# Patient Record
Sex: Male | Born: 1980 | Race: White | Hispanic: No | Marital: Married | Smoking: Current every day smoker
Health system: Southern US, Community
[De-identification: ages and names within clinical notes are randomized; demographics above are authoritative.]

## PROBLEM LIST (undated history)

## (undated) DIAGNOSIS — J45909 Unspecified asthma, uncomplicated: Secondary | ICD-10-CM

## (undated) DIAGNOSIS — F1010 Alcohol abuse, uncomplicated: Secondary | ICD-10-CM

## (undated) DIAGNOSIS — B86 Scabies: Secondary | ICD-10-CM

## (undated) DIAGNOSIS — K5900 Constipation, unspecified: Secondary | ICD-10-CM

## (undated) DIAGNOSIS — E119 Type 2 diabetes mellitus without complications: Secondary | ICD-10-CM

## (undated) DIAGNOSIS — E079 Disorder of thyroid, unspecified: Secondary | ICD-10-CM

## (undated) DIAGNOSIS — A749 Chlamydial infection, unspecified: Secondary | ICD-10-CM

## (undated) DIAGNOSIS — T884XXA Failed or difficult intubation, initial encounter: Secondary | ICD-10-CM

## (undated) DIAGNOSIS — IMO0002 Reserved for concepts with insufficient information to code with codable children: Secondary | ICD-10-CM

## (undated) DIAGNOSIS — N4889 Other specified disorders of penis: Principal | ICD-10-CM

## (undated) HISTORY — DX: Chlamydial infection, unspecified: A74.9

## (undated) HISTORY — DX: Constipation, unspecified: K59.00

## (undated) HISTORY — PX: CYST EXCISION: SHX5701

## (undated) HISTORY — DX: Disorder of thyroid, unspecified: E07.9

## (undated) HISTORY — DX: Unspecified asthma, uncomplicated: J45.909

## (undated) HISTORY — PX: ABCESS DRAINAGE: SHX399

## (undated) HISTORY — DX: Alcohol abuse, uncomplicated: F10.10

## (undated) HISTORY — DX: Reserved for concepts with insufficient information to code with codable children: IMO0002

## (undated) HISTORY — PX: RHYTIDECTOMY NECK / CHEEK / CHIN: SUR1286

## (undated) HISTORY — DX: Scabies: B86

## (undated) HISTORY — DX: Other specified disorders of penis: N48.89

---

## 2008-07-08 ENCOUNTER — Emergency Department: Payer: Self-pay | Admitting: Emergency Medicine

## 2010-08-02 ENCOUNTER — Emergency Department: Payer: Self-pay | Admitting: Unknown Physician Specialty

## 2010-12-17 ENCOUNTER — Emergency Department: Payer: Self-pay | Admitting: Emergency Medicine

## 2011-03-31 ENCOUNTER — Emergency Department: Payer: Self-pay | Admitting: Unknown Physician Specialty

## 2011-05-31 ENCOUNTER — Emergency Department: Payer: Self-pay | Admitting: Emergency Medicine

## 2011-10-06 ENCOUNTER — Emergency Department: Payer: Self-pay | Admitting: Emergency Medicine

## 2011-10-06 LAB — CBC WITH DIFFERENTIAL/PLATELET
Basophil #: 0.1 10*3/uL (ref 0.0–0.1)
Eosinophil #: 0.1 10*3/uL (ref 0.0–0.7)
Eosinophil %: 0.7 %
HGB: 15.3 g/dL (ref 13.0–18.0)
Lymphocyte #: 1.8 10*3/uL (ref 1.0–3.6)
Lymphocyte %: 17 %
MCH: 32 pg (ref 26.0–34.0)
MCHC: 34.5 g/dL (ref 32.0–36.0)
Monocyte %: 6.2 %
Neutrophil #: 8.2 10*3/uL — ABNORMAL HIGH (ref 1.4–6.5)
Neutrophil %: 75.6 %
Platelet: 181 10*3/uL (ref 150–440)
RBC: 4.76 10*6/uL (ref 4.40–5.90)
RDW: 13.2 % (ref 11.5–14.5)
WBC: 10.8 10*3/uL — ABNORMAL HIGH (ref 3.8–10.6)

## 2011-10-06 LAB — BASIC METABOLIC PANEL
BUN: 16 mg/dL (ref 7–18)
Calcium, Total: 8.7 mg/dL (ref 8.5–10.1)
Co2: 25 mmol/L (ref 21–32)
EGFR (African American): >60
EGFR (Non-African Amer.): >60
Glucose: 143 mg/dL — ABNORMAL HIGH (ref 65–99)
Osmolality: 283 (ref 275–301)

## 2012-01-23 ENCOUNTER — Emergency Department: Payer: Self-pay | Admitting: Emergency Medicine

## 2012-01-23 LAB — URINALYSIS, COMPLETE
Bilirubin,UR: NEGATIVE
Blood: NEGATIVE
Ketone: NEGATIVE
Protein: NEGATIVE
RBC,UR: NONE SEEN /HPF (ref 0–5)
Specific Gravity: 1.018 (ref 1.003–1.030)
WBC UR: <1 /HPF (ref 0–5)

## 2012-01-25 ENCOUNTER — Emergency Department: Payer: Self-pay | Admitting: Emergency Medicine

## 2012-01-25 LAB — URINALYSIS, COMPLETE
Blood: NEGATIVE
Glucose,UR: NEGATIVE mg/dL (ref 0–75)
Leukocyte Esterase: NEGATIVE
Nitrite: NEGATIVE
Protein: NEGATIVE
RBC,UR: 1 /HPF (ref 0–5)
WBC UR: 1 /HPF (ref 0–5)

## 2012-02-16 ENCOUNTER — Emergency Department: Payer: Self-pay | Admitting: Emergency Medicine

## 2012-04-28 ENCOUNTER — Emergency Department: Payer: Self-pay | Admitting: Emergency Medicine

## 2012-04-28 LAB — URINALYSIS, COMPLETE
Bacteria: NONE SEEN
Bilirubin,UR: NEGATIVE
Glucose,UR: NEGATIVE mg/dL (ref 0–75)
Ketone: NEGATIVE
Leukocyte Esterase: NEGATIVE
Nitrite: NEGATIVE
Protein: NEGATIVE
Specific Gravity: 1.028 (ref 1.003–1.030)

## 2012-07-25 ENCOUNTER — Emergency Department: Payer: Self-pay | Admitting: Emergency Medicine

## 2012-07-25 LAB — URINALYSIS, COMPLETE
Bacteria: NONE SEEN
Bilirubin,UR: NEGATIVE
Blood: NEGATIVE
Glucose,UR: 50 mg/dL (ref 0–75)
Ketone: NEGATIVE
Leukocyte Esterase: NEGATIVE
Ph: 6 (ref 4.5–8.0)
Protein: NEGATIVE
RBC,UR: <1 /HPF (ref 0–5)
WBC UR: 1 /HPF (ref 0–5)

## 2012-07-27 ENCOUNTER — Emergency Department: Payer: Self-pay | Admitting: Emergency Medicine

## 2012-09-01 ENCOUNTER — Emergency Department: Payer: Self-pay | Admitting: Unknown Physician Specialty

## 2013-03-22 ENCOUNTER — Emergency Department: Payer: Self-pay | Admitting: Emergency Medicine

## 2013-05-03 ENCOUNTER — Emergency Department: Payer: Self-pay | Admitting: Emergency Medicine

## 2013-05-03 LAB — URINALYSIS, COMPLETE
Bacteria: NONE SEEN
Blood: NEGATIVE
Ketone: NEGATIVE
Nitrite: NEGATIVE
Ph: 5 (ref 4.5–8.0)
Protein: NEGATIVE
Specific Gravity: 1.024 (ref 1.003–1.030)

## 2013-05-09 ENCOUNTER — Emergency Department: Payer: Self-pay | Admitting: Emergency Medicine

## 2013-05-09 LAB — COMPREHENSIVE METABOLIC PANEL
Albumin: 3.5 g/dL (ref 3.4–5.0)
Alkaline Phosphatase: 61 U/L
Anion Gap: 5 — ABNORMAL LOW (ref 7–16)
BUN: 11 mg/dL (ref 7–18)
Calcium, Total: 8.9 mg/dL (ref 8.5–10.1)
Chloride: 107 mmol/L (ref 98–107)
Creatinine: 0.9 mg/dL (ref 0.60–1.30)
EGFR (African American): >60
EGFR (Non-African Amer.): >60
Osmolality: 275 (ref 275–301)
Potassium: 3.6 mmol/L (ref 3.5–5.1)
SGPT (ALT): 34 U/L (ref 12–78)
Sodium: 138 mmol/L (ref 136–145)
Total Protein: 7.4 g/dL (ref 6.4–8.2)

## 2013-05-09 LAB — CBC
HCT: 44 % (ref 40.0–52.0)
MCV: 91 fL (ref 80–100)
Platelet: 175 10*3/uL (ref 150–440)
RBC: 4.84 10*6/uL (ref 4.40–5.90)
RDW: 13.4 % (ref 11.5–14.5)

## 2013-05-09 LAB — URINALYSIS, COMPLETE
Bacteria: NONE SEEN
Blood: NEGATIVE
Glucose,UR: NEGATIVE mg/dL (ref 0–75)
Ketone: NEGATIVE
Nitrite: NEGATIVE
Protein: NEGATIVE
RBC,UR: 1 /HPF (ref 0–5)
Specific Gravity: 1.017 (ref 1.003–1.030)
Squamous Epithelial: NONE SEEN
WBC UR: <1 /HPF (ref 0–5)

## 2013-05-16 ENCOUNTER — Emergency Department: Payer: Self-pay | Admitting: Emergency Medicine

## 2013-05-16 LAB — COMPREHENSIVE METABOLIC PANEL
Albumin: 3.8 g/dL (ref 3.4–5.0)
Alkaline Phosphatase: 64 U/L
Anion Gap: 4 — ABNORMAL LOW (ref 7–16)
BUN: 14 mg/dL (ref 7–18)
Bilirubin,Total: 0.3 mg/dL (ref 0.2–1.0)
Calcium, Total: 9 mg/dL (ref 8.5–10.1)
Chloride: 106 mmol/L (ref 98–107)
Co2: 28 mmol/L (ref 21–32)
Creatinine: 0.91 mg/dL (ref 0.60–1.30)
EGFR (African American): >60
EGFR (Non-African Amer.): >60
Glucose: 90 mg/dL (ref 65–99)
Osmolality: 276 (ref 275–301)
Potassium: 4 mmol/L (ref 3.5–5.1)
SGOT(AST): 27 U/L (ref 15–37)
SGPT (ALT): 33 U/L (ref 12–78)
Sodium: 138 mmol/L (ref 136–145)
Total Protein: 7.8 g/dL (ref 6.4–8.2)

## 2013-05-16 LAB — CBC WITH DIFFERENTIAL/PLATELET
Basophil #: 0.1 10*3/uL (ref 0.0–0.1)
Eosinophil #: 0.1 10*3/uL (ref 0.0–0.7)
Eosinophil %: 1.1 %
Lymphocyte #: 2.2 10*3/uL (ref 1.0–3.6)
Lymphocyte %: 18.7 %
MCH: 30.6 pg (ref 26.0–34.0)
MCV: 92 fL (ref 80–100)
Neutrophil #: 8.4 10*3/uL — ABNORMAL HIGH (ref 1.4–6.5)
Platelet: 179 10*3/uL (ref 150–440)
RDW: 13.7 % (ref 11.5–14.5)
WBC: 11.7 10*3/uL — ABNORMAL HIGH (ref 3.8–10.6)

## 2013-05-16 LAB — MONONUCLEOSIS SCREEN: Mono Test: NEGATIVE

## 2013-05-16 LAB — RAPID HIV-1/2 QL/CONFIRM: HIV-1/2,Rapid Ql: NEGATIVE

## 2013-05-31 ENCOUNTER — Emergency Department: Payer: Self-pay | Admitting: Emergency Medicine

## 2013-05-31 LAB — RAPID INFLUENZA A&B ANTIGENS (ARMC ONLY)

## 2013-06-07 ENCOUNTER — Emergency Department: Payer: Self-pay | Admitting: Emergency Medicine

## 2013-07-31 ENCOUNTER — Emergency Department: Payer: Self-pay | Admitting: Emergency Medicine

## 2013-07-31 LAB — URINALYSIS, COMPLETE
BILIRUBIN, UR: NEGATIVE
Bacteria: NONE SEEN
Blood: NEGATIVE
GLUCOSE, UR: NEGATIVE mg/dL (ref 0–75)
Ketone: NEGATIVE
NITRITE: NEGATIVE
PH: 6 (ref 4.5–8.0)
PROTEIN: NEGATIVE
RBC,UR: 4 /HPF (ref 0–5)
SPECIFIC GRAVITY: 1.027 (ref 1.003–1.030)
Squamous Epithelial: NONE SEEN

## 2013-08-02 ENCOUNTER — Emergency Department: Payer: Self-pay | Admitting: Emergency Medicine

## 2013-08-02 LAB — URINALYSIS, COMPLETE
Bacteria: NONE SEEN
Bilirubin,UR: NEGATIVE
Blood: NEGATIVE
Glucose,UR: NEGATIVE mg/dL (ref 0–75)
Ketone: NEGATIVE
Leukocyte Esterase: NEGATIVE
Nitrite: NEGATIVE
Ph: 5 (ref 4.5–8.0)
Protein: NEGATIVE
RBC,UR: 3 /HPF (ref 0–5)
Specific Gravity: 1.026 (ref 1.003–1.030)
Squamous Epithelial: NONE SEEN
WBC UR: 2 /HPF (ref 0–5)

## 2013-08-06 LAB — URINE CULTURE

## 2013-10-11 ENCOUNTER — Emergency Department: Payer: Self-pay | Admitting: Emergency Medicine

## 2013-10-11 LAB — URINALYSIS, COMPLETE
BACTERIA: NONE SEEN
BLOOD: NEGATIVE
Bilirubin,UR: NEGATIVE
Glucose,UR: NEGATIVE mg/dL (ref 0–75)
KETONE: NEGATIVE
Leukocyte Esterase: NEGATIVE
Nitrite: NEGATIVE
Ph: 5 (ref 4.5–8.0)
Protein: NEGATIVE
RBC,UR: NONE SEEN /HPF (ref 0–5)
Specific Gravity: 1.027 (ref 1.003–1.030)
WBC UR: NONE SEEN /HPF (ref 0–5)

## 2013-10-11 LAB — CBC
HCT: 45.9 % (ref 40.0–52.0)
HGB: 15.6 g/dL (ref 13.0–18.0)
MCH: 31.8 pg (ref 26.0–34.0)
MCHC: 34 g/dL (ref 32.0–36.0)
MCV: 93 fL (ref 80–100)
Platelet: 183 10*3/uL (ref 150–440)
RBC: 4.91 10*6/uL (ref 4.40–5.90)
RDW: 13.3 % (ref 11.5–14.5)
WBC: 11 10*3/uL — AB (ref 3.8–10.6)

## 2013-10-11 LAB — COMPREHENSIVE METABOLIC PANEL
ALBUMIN: 3.9 g/dL (ref 3.4–5.0)
ALT: 41 U/L (ref 12–78)
ANION GAP: 3 — AB (ref 7–16)
Alkaline Phosphatase: 57 U/L
BILIRUBIN TOTAL: 0.3 mg/dL (ref 0.2–1.0)
BUN: 14 mg/dL (ref 7–18)
CALCIUM: 8.8 mg/dL (ref 8.5–10.1)
CHLORIDE: 106 mmol/L (ref 98–107)
CO2: 29 mmol/L (ref 21–32)
Creatinine: 1.06 mg/dL (ref 0.60–1.30)
Glucose: 85 mg/dL (ref 65–99)
OSMOLALITY: 275 (ref 275–301)
Potassium: 4 mmol/L (ref 3.5–5.1)
SGOT(AST): 31 U/L (ref 15–37)
SODIUM: 138 mmol/L (ref 136–145)
Total Protein: 7.9 g/dL (ref 6.4–8.2)

## 2013-10-23 ENCOUNTER — Emergency Department: Payer: Self-pay

## 2013-11-05 ENCOUNTER — Emergency Department: Payer: Self-pay | Admitting: Emergency Medicine

## 2013-12-06 ENCOUNTER — Emergency Department: Payer: Self-pay | Admitting: Emergency Medicine

## 2013-12-06 LAB — URINALYSIS, COMPLETE
BLOOD: NEGATIVE
Bilirubin,UR: NEGATIVE
KETONE: NEGATIVE
LEUKOCYTE ESTERASE: NEGATIVE
NITRITE: NEGATIVE
PH: 6 (ref 4.5–8.0)
Protein: NEGATIVE
RBC,UR: <1 /HPF (ref 0–5)
SPECIFIC GRAVITY: 1.027 (ref 1.003–1.030)
Squamous Epithelial: <1
WBC UR: 1 /HPF (ref 0–5)

## 2013-12-06 LAB — GC/CHLAMYDIA PROBE AMP

## 2014-04-02 ENCOUNTER — Emergency Department: Payer: Self-pay | Admitting: Emergency Medicine

## 2014-04-13 ENCOUNTER — Emergency Department: Payer: Self-pay | Admitting: Internal Medicine

## 2014-04-13 LAB — URINALYSIS, COMPLETE
BILIRUBIN, UR: NEGATIVE
Bacteria: NONE SEEN
Blood: NEGATIVE
Glucose,UR: NEGATIVE mg/dL (ref 0–75)
KETONE: NEGATIVE
Leukocyte Esterase: NEGATIVE
NITRITE: NEGATIVE
PH: 5 (ref 4.5–8.0)
Protein: NEGATIVE
RBC,UR: <1 /HPF (ref 0–5)
SQUAMOUS EPITHELIAL: NONE SEEN
Specific Gravity: 1.019 (ref 1.003–1.030)
WBC UR: 1 /HPF (ref 0–5)

## 2014-04-16 LAB — BETA STREP CULTURE(ARMC)

## 2014-06-26 ENCOUNTER — Emergency Department: Payer: Self-pay | Admitting: Emergency Medicine

## 2014-06-26 LAB — URINALYSIS, COMPLETE
BACTERIA: NONE SEEN
Bilirubin,UR: NEGATIVE
Blood: NEGATIVE
GLUCOSE, UR: NEGATIVE mg/dL (ref 0–75)
Ketone: NEGATIVE
LEUKOCYTE ESTERASE: NEGATIVE
Nitrite: NEGATIVE
Ph: 6 (ref 4.5–8.0)
Protein: NEGATIVE
RBC,UR: 1 /HPF (ref 0–5)
SPECIFIC GRAVITY: 1.027 (ref 1.003–1.030)
WBC UR: NONE SEEN /HPF (ref 0–5)

## 2014-06-26 LAB — GC/CHLAMYDIA PROBE AMP

## 2014-06-29 ENCOUNTER — Emergency Department: Payer: Self-pay | Admitting: Physician Assistant

## 2014-07-05 ENCOUNTER — Emergency Department: Payer: Self-pay | Admitting: Emergency Medicine

## 2014-07-05 LAB — COMPREHENSIVE METABOLIC PANEL
ALK PHOS: 69 U/L (ref 46–116)
ALT: 36 U/L (ref 14–63)
AST: 30 U/L (ref 15–37)
Albumin: 4 g/dL (ref 3.4–5.0)
Anion Gap: 8 (ref 7–16)
BILIRUBIN TOTAL: 0.4 mg/dL (ref 0.2–1.0)
BUN: 12 mg/dL (ref 7–18)
CALCIUM: 8.8 mg/dL (ref 8.5–10.1)
Chloride: 107 mmol/L (ref 98–107)
Co2: 24 mmol/L (ref 21–32)
Creatinine: 0.94 mg/dL (ref 0.60–1.30)
EGFR (African American): >60
EGFR (Non-African Amer.): >60
GLUCOSE: 86 mg/dL (ref 65–99)
Osmolality: 277 (ref 275–301)
Potassium: 3.8 mmol/L (ref 3.5–5.1)
Sodium: 139 mmol/L (ref 136–145)
Total Protein: 8 g/dL (ref 6.4–8.2)

## 2014-07-05 LAB — URINALYSIS, COMPLETE
BILIRUBIN, UR: NEGATIVE
BLOOD: NEGATIVE
Bacteria: NONE SEEN
Glucose,UR: NEGATIVE mg/dL (ref 0–75)
Leukocyte Esterase: NEGATIVE
Nitrite: NEGATIVE
PH: 5 (ref 4.5–8.0)
Protein: NEGATIVE
RBC, UR: NONE SEEN /HPF (ref 0–5)
SQUAMOUS EPITHELIAL: NONE SEEN
Specific Gravity: 1.026 (ref 1.003–1.030)
WBC UR: NONE SEEN /HPF (ref 0–5)

## 2014-07-05 LAB — CBC WITH DIFFERENTIAL/PLATELET
BASOS ABS: 0.1 10*3/uL (ref 0.0–0.1)
Basophil %: 0.8 %
Eosinophil #: 0.1 10*3/uL (ref 0.0–0.7)
Eosinophil %: 0.6 %
HCT: 47.4 % (ref 40.0–52.0)
HGB: 15.8 g/dL (ref 13.0–18.0)
Lymphocyte #: 1.7 10*3/uL (ref 1.0–3.6)
Lymphocyte %: 15.4 %
MCH: 31.2 pg (ref 26.0–34.0)
MCHC: 33.3 g/dL (ref 32.0–36.0)
MCV: 94 fL (ref 80–100)
MONO ABS: 0.7 x10 3/mm (ref 0.2–1.0)
MONOS PCT: 6 %
Neutrophil #: 8.3 10*3/uL — ABNORMAL HIGH (ref 1.4–6.5)
Neutrophil %: 77.2 %
PLATELETS: 185 10*3/uL (ref 150–440)
RBC: 5.07 10*6/uL (ref 4.40–5.90)
RDW: 13.2 % (ref 11.5–14.5)
WBC: 10.8 10*3/uL — AB (ref 3.8–10.6)

## 2014-07-05 LAB — LIPASE, BLOOD: Lipase: 160 U/L (ref 73–393)

## 2014-09-26 ENCOUNTER — Emergency Department: Admit: 2014-09-26 | Disposition: A | Discharge status: Home / Self Care | Payer: Self-pay | Admitting: Student

## 2014-09-28 ENCOUNTER — Emergency Department
Admission: EM | Admit: 2014-09-28 | Discharge: 2014-09-28 | Disposition: A | Discharge status: Home / Self Care | Payer: Self-pay | Attending: Emergency Medicine | Admitting: Emergency Medicine

## 2014-09-28 ENCOUNTER — Encounter: Payer: Self-pay | Admitting: Emergency Medicine

## 2014-09-28 DIAGNOSIS — Z5189 Encounter for other specified aftercare: Secondary | ICD-10-CM

## 2014-09-28 DIAGNOSIS — Z720 Tobacco use: Secondary | ICD-10-CM | POA: Insufficient documentation

## 2014-09-28 DIAGNOSIS — J069 Acute upper respiratory infection, unspecified: Secondary | ICD-10-CM | POA: Insufficient documentation

## 2014-09-28 DIAGNOSIS — Z4801 Encounter for change or removal of surgical wound dressing: Secondary | ICD-10-CM | POA: Insufficient documentation

## 2014-09-28 MED ORDER — GUAIFENESIN 100 MG/5ML PO SOLN
5.0000 mL | ORAL | Status: DC | PRN
Start: 2014-09-28 — End: 2014-12-18

## 2014-09-28 NOTE — ED Notes (Signed)
Patient to ED with c/o wound check. Patient had wound drained and packed on Friday. Patient denies increase drainage, pain, or discomfort with packing. Patient also complaining of head congestion. Patient alert and oriented. Patient in NAD.

## 2014-09-28 NOTE — ED Provider Notes (Signed)
[facility] Emergency Department Provider Note    ____________________________________________  Time seen: 07 50  I have reviewed the triage vital signs and the nursing notes.   HISTORY  Chief Complaint Wound Check       HPI Ronald Montgomery is a 34 y.o. male Mr. Fettig returns today for wound check after an I&D on the perineum on Friday he reports that his pain is about a 4 on a 10 he has had some purulent drainage overall feeling better. Mr. Leh also complains today of congestion and cough which started 2 days ago.    History reviewed. No pertinent past medical history.  There are no active problems to display for this patient.   Past Surgical History  Procedure Laterality Date  . Neck surgery      No current outpatient prescriptions on file.  Allergies Review of patient's allergies indicates no known allergies.  History reviewed. No pertinent family history.  Social History History  Substance Use Topics  . Smoking status: Smoker, Current Status Unknown  . Smokeless tobacco: Not on file  . Alcohol Use: No    Review of Systems  Constitutional: Negative for fever. Eyes: Negative for visual changes. ENT:  sore throat. Cardiovascular: Negative for chest pain. Respiratory: Negative for shortness of breath. Gastrointestinal: Negative for abdominal pain, vomiting and diarrhea. Musculoskeletal: Negative for back pain. Skin: Negative for rash. Neurological: Negative for headaches, focal weakness or numbness.   10-point ROS otherwise negative.  ____________________________________________   PHYSICAL EXAM:  VITAL SIGNS: ED Triage Vitals  Enc Vitals Group     BP 09/28/14 0737 135/78 mmHg     Pulse Rate 09/28/14 0737 95     Resp 09/28/14 0737 18     Temp 09/28/14 0737 99.1 F (37.3 C)     Temp Source 09/28/14 0737 Oral     SpO2 09/28/14 0737 98 %     Weight 09/28/14 0737 240 lb (108.863 kg)     Height 09/28/14 0737   (1.676 m)     Head Cir --      Peak Flow --      Pain Score 09/28/14 0739 7     Pain Loc --      Pain Edu? --      Excl. in GC? --      Constitutional: Alert and oriented. Well appearing and in no distress. Eyes: Conjunctivae are normal. PERRL. Normal extraocular movements. ENT   Head: Normocephalic and atraumatic.   Nose: No congestion/rhinnorhea.   Mouth/Throat: Mucous membranes are moist.   Neck: No stridor. Hematological/Lymphatic/Immunilogical: No cervical lymphadenopathy. Cardiovascular: Normal rate, regular rhythm. Normal and symmetric distal pulses are present in all extremities. Respiratory: Normal respiratory effort without tachypnea nor retractions. Breath sounds are clear and equal bilaterally. No wheezes/rales/rhonchi. Gastrointestinal: Soft and nontender. No distention. No abdominal bruits. There is no CVA tenderness. Musculoskeletal: Nontender with normal range of motion in all extremities. No joint effusions.   Right lower leg:  No tenderness or edema.   Left lower leg:  No tenderness or edema. Neurologic:  Normal speech and language. No gross focal neurologic deficits are appreciated. Speech is normal. No gait instability. Skin:  Skin is warm, dry and intact. No rash noted. Site of I&D appears to be healing well with no fluctuance. Packing removed. Psychiatric: Mood and affect are normal. Speech and behavior are normal. Patient exhibits appropriate insight and judgment.  ____________________________________________   EKG    ____________________________________________    RADIOLOGY  ____________________________________________   PROCEDURES  Procedure(s) performed: Iodiform gauze removed from perineal area  Critical Care performed: No  ____________________________________________   INITIAL IMPRESSION / ASSESSMENT AND PLAN / ED COURSE  Pertinent labs & imaging results that were available during my care of the patient were  reviewed by me and considered in my medical decision making (see chart for details).  Wound check;  upper respiratory infection  ____________________________________________   FINAL CLINICAL IMPRESSION(S) / ED DIAGNOSES  Final diagnoses:  None    Chinita PesterCari B Lillis Nuttle, FNP 09/28/14 [phone]

## 2014-09-28 NOTE — ED Notes (Signed)
Packing removed from wound by Lorra Halsaribeth Triplett, PA. Patient tolerated packing removal.

## 2014-11-08 ENCOUNTER — Emergency Department
Admission: EM | Admit: 2014-11-08 | Discharge: 2014-11-08 | Disposition: A | Discharge status: Home / Self Care | Payer: Self-pay | Attending: Emergency Medicine | Admitting: Emergency Medicine

## 2014-11-08 ENCOUNTER — Encounter: Payer: Self-pay | Admitting: Emergency Medicine

## 2014-11-08 DIAGNOSIS — N4889 Other specified disorders of penis: Secondary | ICD-10-CM | POA: Insufficient documentation

## 2014-11-08 DIAGNOSIS — Z720 Tobacco use: Secondary | ICD-10-CM | POA: Insufficient documentation

## 2014-11-08 DIAGNOSIS — R300 Dysuria: Secondary | ICD-10-CM | POA: Insufficient documentation

## 2014-11-08 LAB — URINALYSIS COMPLETE WITH MICROSCOPIC (ARMC ONLY)
Bacteria, UA: NONE SEEN
Bilirubin Urine: NEGATIVE
GLUCOSE, UA: NEGATIVE mg/dL
Hgb urine dipstick: NEGATIVE
Ketones, ur: NEGATIVE mg/dL
Leukocytes, UA: NEGATIVE
NITRITE: NEGATIVE
PH: 6 (ref 5.0–8.0)
Protein, ur: NEGATIVE mg/dL
Specific Gravity, Urine: 1.014 (ref 1.005–1.030)

## 2014-11-08 MED ORDER — KETOROLAC TROMETHAMINE 10 MG PO TABS
10.0000 mg | ORAL_TABLET | Freq: Once | ORAL | Status: AC
  Administered 2014-11-08: 10 mg via ORAL

## 2014-11-08 MED ORDER — PHENAZOPYRIDINE HCL 200 MG PO TABS: 200.0000 mg | ORAL_TABLET | Freq: Three times a day (TID) | ORAL | Status: DC | PRN

## 2014-11-08 MED ORDER — NAPROXEN 500 MG PO TABS: 500.0000 mg | ORAL_TABLET | Freq: Two times a day (BID) | ORAL | Status: DC

## 2014-11-08 MED ORDER — PHENAZOPYRIDINE HCL 200 MG PO TABS
ORAL_TABLET | ORAL | Status: AC
  Administered 2014-11-08: 200 mg via ORAL
  Filled 2014-11-08: qty 1

## 2014-11-08 MED ORDER — KETOROLAC TROMETHAMINE 10 MG PO TABS
ORAL_TABLET | ORAL | Status: AC
  Administered 2014-11-08: 10 mg via ORAL
  Filled 2014-11-08: qty 1

## 2014-11-08 MED ORDER — PHENAZOPYRIDINE HCL 200 MG PO TABS
200.0000 mg | ORAL_TABLET | Freq: Once | ORAL | Status: AC
  Administered 2014-11-08: 200 mg via ORAL

## 2014-11-08 NOTE — ED Provider Notes (Signed)
CSN: [id number]     Arrival date & time 11/08/14  1244 History   First MD Initiated Contact with Patient 11/08/14 1301     Chief Complaint  Patient presents with  . Penis Pain    pain with ejaculation last night     (Consider location/radiation/quality/duration/timing/severity/associated sxs/prior Treatment) HPI  Patient presents today with penile pain dysuria for 1 day take as a new sexual partner has not noticed any discharge he has had some pain which she says is near his scrotum that he is currently not having and noted a pain with ejaculation X1 otherwise denies symptoms. Dates that he has had multiple issues due to the fact that he is uncircumcised seen urologist in the past they he had chlamydia once in the past he does not have symptoms that are similar to that and is here for further evaluation and treatment   History reviewed. No pertinent past medical history. Past Surgical History  Procedure Laterality Date  . Neck surgery     No family history on file. History  Substance Use Topics  . Smoking status: Smoker, Current Status Unknown -- 0.50 packs/day    Types: Cigarettes  . Smokeless tobacco: Not on file  . Alcohol Use: No    Review of Systems  Constitutional: Negative.   HENT: Negative.   Eyes: Negative.   Respiratory: Negative.   Cardiovascular: Negative.   Gastrointestinal: Negative.   Musculoskeletal: Negative.   Skin: Negative.   Neurological: Negative.   All other systems reviewed and are negative.      Allergies  Review of patient's allergies indicates no known allergies.  Home Medications   Prior to Admission medications   Medication Sig Start Date End Date Taking? Authorizing Provider  guaiFENesin (ROBITUSSIN) 100 MG/5ML SOLN Take 5 mLs (100 mg total) by mouth every 4 (four) hours as needed for cough or to loosen phlegm. 09/28/14   Chinita Pester, FNP  naproxen (NAPROSYN) 500 MG tablet Take 1 tablet (500 mg total) by mouth 2 (two) times daily  with a meal. 11/08/14 11/08/15  Heike Pounds Kristine Garbe Raeshawn Tafolla, PA-C  phenazopyridine (PYRIDIUM) 200 MG tablet Take 1 tablet (200 mg total) by mouth 3 (three) times daily as needed for pain. 11/08/14 11/08/15  Deshayla Empson William C Augustino Savastano, PA-C   BP 127/86 mmHg  Pulse 113  Temp(Src) 98.5 F (36.9 C) (Oral)  Resp 20  Ht  (1.676 m)  Wt 250 lb (113.399 kg)  BMI 40.37 kg/m2  SpO2 98% Physical Exam BP 127/86 mmHg  Pulse 113  Temp(Src) 98.5 F (36.9 C) (Oral)  Resp 20  Ht  (1.676 m)  Wt 250 lb (113.399 kg)  BMI 40.37 kg/m2  SpO2 98%  General Appearance:    Alert, cooperative, no distress, appears stated age  Head:    Normocephalic, without obvious abnormality, atraumatic   Eyes:    PERRL, conjunctiva/corneas clear, EOM's intact, fundi    benign, both eyes       Ears:    Normal TM's and external ear canals, both ears  Nose:   Nares normal, septum midline, mucosa normal, no drainage   or sinus tenderness  Throat:   Lips, mucosa, and tongue normal; teeth and gums normal  Neck:   Supple, symmetrical, trachea midline, no adenopathy;       thyroid:  No enlargement/tenderness/nodules; no carotid   bruit or JVD  Back:     Symmetric, no curvature, ROM normal, no CVA tenderness  Lungs:  Clear to auscultation bilaterally, respirations unlabored  Chest wall:    No tenderness or deformity  Heart:    Regular rate and rhythm, S1 and S2 normal, no murmur, rub   or gallop  Abdomen:     Soft, non-tender, bowel sounds active all four quadrants,    no masses, no organomegaly  Genitalia:    Normal male without lesion, discharge or tenderness   Patient uncircumcised is a small fissure at the base of the head of his penis where his foreskins attached   Extremities:   Extremities normal, atraumatic, no cyanosis or edema  Pulses:   2+ and symmetric all extremities  Skin:   Skin color, texture, turgor normal, no rashes or lesions     Neurologic:   CNII-XII intact. Normal strength, sensation and reflexes       throughout   ED Course  Procedures (including critical care time) Labs Review Labs Reviewed  URINALYSIS COMPLETEWITH MICROSCOPIC ([facility] ONLY) - Abnormal; Notable for the following:    Color, Urine YELLOW (*)    APPearance CLEAR (*)    Squamous Epithelial / LPF 0-5 (*)    All other components within normal limits    Imaging Review No results found.   EKG Interpretation None      MDM   decision making on this patient otherwise normal exam normal urinalysis being that he said problems for many years with this in the past recommending that he follow up with urology discussed getting a circumcisio was given prescriptions for Pyridium and Motrin follow-up urology return here for any acute concerns or worsening symptoms Final diagnoses:  Dysuria  Foreskin fissure        Lashena Signer Rosalyn Gess, PA-C 11/08/14 1607  Governor Rooks, MD 11/09/14 1528

## 2014-11-08 NOTE — ED Notes (Signed)
No discharge

## 2014-11-08 NOTE — Discharge Instructions (Signed)

## 2014-11-24 ENCOUNTER — Encounter: Payer: Self-pay | Admitting: General Practice

## 2014-11-24 ENCOUNTER — Emergency Department: Payer: Self-pay

## 2014-11-24 ENCOUNTER — Emergency Department
Admission: EM | Admit: 2014-11-24 | Discharge: 2014-11-24 | Disposition: A | Discharge status: Home / Self Care | Payer: Self-pay | Attending: Emergency Medicine | Admitting: Emergency Medicine

## 2014-11-24 DIAGNOSIS — Z720 Tobacco use: Secondary | ICD-10-CM | POA: Insufficient documentation

## 2014-11-24 DIAGNOSIS — L309 Dermatitis, unspecified: Secondary | ICD-10-CM | POA: Insufficient documentation

## 2014-11-24 DIAGNOSIS — R1084 Generalized abdominal pain: Secondary | ICD-10-CM | POA: Insufficient documentation

## 2014-11-24 DIAGNOSIS — R21 Rash and other nonspecific skin eruption: Secondary | ICD-10-CM

## 2014-11-24 DIAGNOSIS — R109 Unspecified abdominal pain: Secondary | ICD-10-CM

## 2014-11-24 LAB — COMPREHENSIVE METABOLIC PANEL
ALBUMIN: 3.8 g/dL (ref 3.5–5.0)
ALT: 26 U/L (ref 17–63)
AST: 24 U/L (ref 15–41)
Alkaline Phosphatase: 43 U/L (ref 38–126)
Anion gap: 7 (ref 5–15)
BILIRUBIN TOTAL: 0.3 mg/dL (ref 0.3–1.2)
BUN: 11 mg/dL (ref 6–20)
CALCIUM: 8.7 mg/dL — AB (ref 8.9–10.3)
CO2: 28 mmol/L (ref 22–32)
Chloride: 104 mmol/L (ref 101–111)
Creatinine, Ser: 1.1 mg/dL (ref 0.61–1.24)
GFR calc Af Amer: >60 mL/min (ref >60)
GFR calc non Af Amer: >60 mL/min (ref >60)
Glucose, Bld: 118 mg/dL — ABNORMAL HIGH (ref 65–99)
Potassium: 4 mmol/L (ref 3.5–5.1)
Sodium: 139 mmol/L (ref 135–145)
TOTAL PROTEIN: 6.7 g/dL (ref 6.5–8.1)

## 2014-11-24 LAB — CBC WITH DIFFERENTIAL/PLATELET
Basophils Absolute: 0.1 10*3/uL (ref 0–0.1)
Basophils Relative: 1 %
EOS ABS: 0.1 10*3/uL (ref 0–0.7)
EOS PCT: 1 %
HEMATOCRIT: 45.3 % (ref 40.0–52.0)
HEMOGLOBIN: 15 g/dL (ref 13.0–18.0)
Lymphocytes Relative: 17 %
Lymphs Abs: 1.9 10*3/uL (ref 1.0–3.6)
MCH: 30.6 pg (ref 26.0–34.0)
MCHC: 33.2 g/dL (ref 32.0–36.0)
MCV: 92.3 fL (ref 80.0–100.0)
MONO ABS: 0.6 10*3/uL (ref 0.2–1.0)
Monocytes Relative: 5 %
Neutro Abs: 8.2 10*3/uL — ABNORMAL HIGH (ref 1.4–6.5)
Neutrophils Relative %: 76 %
Platelets: 203 10*3/uL (ref 150–440)
RBC: 4.91 MIL/uL (ref 4.40–5.90)
RDW: 13.9 % (ref 11.5–14.5)
WBC: 10.9 10*3/uL — AB (ref 3.8–10.6)

## 2014-11-24 LAB — URINALYSIS COMPLETE WITH MICROSCOPIC (ARMC ONLY)
Bacteria, UA: NONE SEEN
Bilirubin Urine: NEGATIVE
Glucose, UA: NEGATIVE mg/dL
Hgb urine dipstick: NEGATIVE
KETONES UR: NEGATIVE mg/dL
Leukocytes, UA: NEGATIVE
Nitrite: NEGATIVE
PH: 5 (ref 5.0–8.0)
PROTEIN: NEGATIVE mg/dL
SPECIFIC GRAVITY, URINE: 1.024 (ref 1.005–1.030)

## 2014-11-24 MED ORDER — TRIAMCINOLONE ACETONIDE 0.5 % EX OINT: 1.0000 "application " | TOPICAL_OINTMENT | Freq: Two times a day (BID) | CUTANEOUS | Status: DC

## 2014-11-24 MED ORDER — RANITIDINE HCL 150 MG PO TABS: 150.0000 mg | ORAL_TABLET | Freq: Every day | ORAL | Status: DC

## 2014-11-24 NOTE — ED Notes (Signed)
Pt. Arrived to ed from home with reports of experiencing a testicle pain and rash to inner thighs that started last week. Pt reports on Friday he started gradually experiencing abdominal pain. Pt alert and oriented. Reports one episode of vomiting this AM.

## 2014-11-24 NOTE — Discharge Instructions (Signed)
Abdominal Pain °Many things can cause abdominal pain. Usually, abdominal pain is not caused by a disease and will improve without treatment. It can often be observed and treated at home. Your health care provider will do a physical exam and possibly order blood tests and X-rays to help determine the seriousness of your pain. However, in many cases, more time must pass before a clear cause of the pain can be found. Before that point, your health care provider may not know if you need more testing or further treatment. °HOME CARE INSTRUCTIONS  °Monitor your abdominal pain for any changes. The following actions may help to alleviate any discomfort you are experiencing: °· Only take over-the-counter or prescription medicines as directed by your health care provider. °· Do not take laxatives unless directed to do so by your health care provider. °· Try a clear liquid diet (broth, tea, or water) as directed by your health care provider. Slowly move to a bland diet as tolerated. °SEEK MEDICAL CARE IF: °· You have unexplained abdominal pain. °· You have abdominal pain associated with nausea or diarrhea. °· You have pain when you urinate or have a bowel movement. °· You experience abdominal pain that wakes you in the night. °· You have abdominal pain that is worsened or improved by eating food. °· You have abdominal pain that is worsened with eating fatty foods. °· You have a fever. °SEEK IMMEDIATE MEDICAL CARE IF:  °· Your pain does not go away within 2 hours. °· You keep throwing up (vomiting). °· Your pain is felt only in portions of the abdomen, such as the right side or the left lower portion of the abdomen. °· You pass bloody or black tarry stools. °MAKE SURE YOU: °· Understand these instructions.   °· Will watch your condition.   °· Will get help right away if you are not doing well or get worse.   °Document Released: 02/23/2005 Document Revised: 05/21/2013 Document Reviewed: 01/23/2013 °[employer]® Patient Information  ©2015 [employer]. This information is not intended to replace advice given to you by your health care provider. Make sure you discuss any questions you have with your health care provider. ° °Rash °A rash is a change in the color or texture of your skin. There are many different types of rashes. You may have other problems that accompany your rash. °CAUSES  °· Infections. °· Allergic reactions. This can include allergies to pets or foods. °· Certain medicines. °· Exposure to certain chemicals, soaps, or cosmetics. °· Heat. °· Exposure to poisonous plants. °· Tumors, both cancerous and noncancerous. °SYMPTOMS  °· Redness. °· Scaly skin. °· Itchy skin. °· Dry or cracked skin. °· Bumps. °· Blisters. °· Pain. °DIAGNOSIS  °Your caregiver may do a physical exam to determine what type of rash you have. A skin sample (biopsy) may be taken and examined under a microscope. °TREATMENT  °Treatment depends on the type of rash you have. Your caregiver may prescribe certain medicines. For serious conditions, you may need to see a skin doctor (dermatologist). °HOME CARE INSTRUCTIONS  °· Avoid the substance that caused your rash. °· Do not scratch your rash. This can cause infection. °· You may take cool baths to help stop itching. °· Only take over-the-counter or prescription medicines as directed by your caregiver. °· Keep all follow-up appointments as directed by your caregiver. °SEEK IMMEDIATE MEDICAL CARE IF: °· You have increasing pain, swelling, or redness. °· You have a fever. °· You have new or severe symptoms. °· You   have body aches, diarrhea, or vomiting. °· Your rash is not better after 3 days. °MAKE SURE YOU: °· Understand these instructions. °· Will watch your condition. °· Will get help right away if you are not doing well or get worse. °Document Released: 05/06/2002 Document Revised: 08/08/2011 Document Reviewed: 02/28/2011 °[employer]® Patient Information ©2015 [employer]. This information is not intended to  replace advice given to you by your health care provider. Make sure you discuss any questions you have with your health care provider. ° °

## 2014-11-24 NOTE — ED Provider Notes (Signed)
[facility] Emergency Department Provider Note     Time seen: ----------------------------------------- 6:57 PM on 11/24/2014 -----------------------------------------    I have reviewed the triage vital signs and the nursing notes.   HISTORY  Chief Complaint Abdominal Pain; Nausea; Emesis; and Rash    HPI Luciano CutterDonald L Nomura is a 34 y.o. male who presents ER for abdominal pain is generalized over the last several weeks. Also complains of rash in his inner thighs that itch whenever anyone touches them. Patient reports abdominal pain is increasing, his taken laxatives without any improvement.   History reviewed. No pertinent past medical history.  There are no active problems to display for this patient.   Past Surgical History  Procedure Laterality Date  . Neck surgery      Allergies Review of patient's allergies indicates no known allergies.  Social History History  Substance Use Topics  . Smoking status: Smoker, Current Status Unknown -- 0.50 packs/day    Types: Cigarettes  . Smokeless tobacco: Not on file  . Alcohol Use: No    Review of Systems Constitutional: Negative for fever. Eyes: Negative for visual changes. ENT: Negative for sore throat. Cardiovascular: Negative for chest pain. Respiratory: Negative for shortness of breath. Gastrointestinal: Positive for abdominal pain Genitourinary: Negative for dysuria. Musculoskeletal: Negative for back pain. Skin: Positive for for rash in the inner thighs Neurological: Negative for headaches, focal weakness or numbness.  10-point ROS otherwise negative.  ____________________________________________   PHYSICAL EXAM:  VITAL SIGNS: ED Triage Vitals  Enc Vitals Group     BP 11/24/14 1746 134/86 mmHg     Pulse Rate 11/24/14 1746 89     Resp 11/24/14 1746 18     Temp 11/24/14 1746 98.3 F (36.8 C)     Temp Source 11/24/14 1746 Oral     SpO2 11/24/14 1746 98 %     Weight 11/24/14  1746 250 lb (113.399 kg)     Height 11/24/14 1746 5\' 6"  (1.676 m)     Head Cir --      Peak Flow --      Pain Score 11/24/14 1746 5     Pain Loc --      Pain Edu? --      Excl. in GC? --     Constitutional: Alert and oriented. Well appearing and in no distress. Eyes: Conjunctivae are normal. PERRL. Normal extraocular movements. ENT   Head: Normocephalic and atraumatic.   Nose: No congestion/rhinnorhea.   Mouth/Throat: Mucous membranes are moist.   Neck: No stridor. Hematological/Lymphatic/Immunilogical: No cervical lymphadenopathy. Cardiovascular: Normal rate, regular rhythm. Normal and symmetric distal pulses are present in all extremities. No murmurs, rubs, or gallops. Respiratory: Normal respiratory effort without tachypnea nor retractions. Breath sounds are clear and equal bilaterally. No wheezes/rales/rhonchi. Gastrointestinal: Soft and nontender. No distention. No abdominal bruits. There is no CVA tenderness. Musculoskeletal: Nontender with normal range of motion in all extremities. No joint effusions.  No lower extremity tenderness nor edema. Neurologic:  Normal speech and language. No gross focal neurologic deficits are appreciated. Speech is normal. No gait instability. Skin:  Dermatitis is noted in the inner thighs. Psychiatric: Mood and affect are normal. Speech and behavior are normal. Patient exhibits appropriate insight and judgment. ____________________________________________  ED COURSE:  Pertinent labs & imaging results that were available during my care of the patient were reviewed by me and considered in my medical decision making (see chart for details). Patient with benign exam, we'll check labs, urine ____________________________________________  LABS (pertinent positives/negatives)  Labs Reviewed  CBC WITH DIFFERENTIAL/PLATELET - Abnormal; Notable for the following:    WBC 10.9 (*)    Neutro Abs 8.2 (*)    All other components within normal  limits  COMPREHENSIVE METABOLIC PANEL - Abnormal; Notable for the following:    Glucose, Bld 118 (*)    Calcium 8.7 (*)    All other components within normal limits  URINALYSIS COMPLETEWITH MICROSCOPIC ([facility] ONLY) - Abnormal; Notable for the following:    Color, Urine YELLOW (*)    APPearance CLEAR (*)    Squamous Epithelial / LPF 0-5 (*)    All other components within normal limits    RADIOLOGY  FINDINGS: The bowel gas pattern is normal. There is no evidence of free air. No radio-opaque calculi or other significant radiographic abnormality is seen.  IMPRESSION: Negative.  ____________________________________________  FINAL ASSESSMENT AND PLAN  Abdominal pain and rash  Plan: Patient's exam is benign. We'll discharge with medications for reflux. Rash is likely from repetitive friction or heat related. Advise steroid cream for the rash. Follow up as needed.   Emily Filbert, MD   Emily Filbert, MD 11/24/14 [phone]

## 2014-12-18 ENCOUNTER — Encounter: Payer: Self-pay | Admitting: Urology

## 2014-12-18 ENCOUNTER — Ambulatory Visit (INDEPENDENT_AMBULATORY_CARE_PROVIDER_SITE_OTHER): Payer: Self-pay | Admitting: Urology

## 2014-12-18 VITALS — BP 124/83 | HR 91 | Ht 66.0 in | Wt 256.7 lb

## 2014-12-18 DIAGNOSIS — N411 Chronic prostatitis: Secondary | ICD-10-CM

## 2014-12-18 DIAGNOSIS — N508 Other specified disorders of male genital organs: Secondary | ICD-10-CM

## 2014-12-18 DIAGNOSIS — N4889 Other specified disorders of penis: Secondary | ICD-10-CM

## 2014-12-18 DIAGNOSIS — N50819 Testicular pain, unspecified: Secondary | ICD-10-CM

## 2014-12-18 LAB — URINALYSIS, COMPLETE
Bilirubin, UA: NEGATIVE
Glucose, UA: NEGATIVE
Ketones, UA: NEGATIVE
LEUKOCYTES UA: NEGATIVE
Nitrite, UA: NEGATIVE
RBC, UA: NEGATIVE
Specific Gravity, UA: 1.025 (ref 1.005–1.030)
UUROB: 0.2 mg/dL (ref 0.2–1.0)
pH, UA: 6 (ref 5.0–7.5)

## 2014-12-18 LAB — MICROSCOPIC EXAMINATION: Bacteria, UA: NONE SEEN

## 2014-12-18 MED ORDER — DOXYCYCLINE HYCLATE 100 MG PO CAPS: 100.0000 mg | ORAL_CAPSULE | Freq: Every day | ORAL | Status: DC

## 2014-12-18 NOTE — Progress Notes (Signed)
12/18/2014 4:51 PM   Ronald Montgomery 09/24/1980 [id number]  Referring provider: No referring provider defined for this encounter.  Chief Complaint  Patient presents with  . Penis Pain    New Patient    HPI: Patient comes in with multiple complaints. At the end of sex he has pain on the end of his penis. He ejaculates well. He does not have premature ejaculation. He complains of bilateral testicular pain. He does not have pain during sex when the testicles retract up in toward his inguinal canals. He complains of bilateral costovertebral angle tenderness but is mostly untwisting and moving and I think it's more A function of his obesity and lack of conditioning.    PMH: Past Medical History  Diagnosis Date  . Alcohol abuse   . Chlamydia   . Asthma   . Thyroid disorder     during childhood  . Constipation   . Scabies     Surgical History: Past Surgical History  Procedure Laterality Date  . Abcess drainage      groin area  . Rhytidectomy neck / cheek / chin      due to accident  . Cyst excision      top of head    Home Medications:    Medication List       This list is accurate as of: 12/18/14  4:51 PM.  Always use your most recent med list.               doxycycline 100 MG capsule  Commonly known as:  VIBRAMYCIN  Take 1 capsule (100 mg total) by mouth daily.     triamcinolone ointment 0.5 %  Commonly known as:  KENALOG  Apply 1 application topically 2 (two) times daily.        Allergies: No Known Allergies  Family History: Family History  Problem Relation Age of Onset  . Thyroid cancer Maternal Grandfather   . Skin cancer Mother   . Heart attack Maternal Grandfather   . Diabetes Maternal Grandfather   . Diabetes Mother     Social History:  reports that he has been smoking Cigarettes.  He has been smoking about 0.50 packs per day. He does not have any smokeless tobacco history on file. He reports that he drinks alcohol. He reports that he uses  illicit drugs (Marijuana).  ROS: UROLOGY Frequent Urination?: No Hard to postpone urination?: No Burning/pain with urination?: No Get up at night to urinate?: No Leakage of urine?: No Urine stream starts and stops?: No Trouble starting stream?: No Do you have to strain to urinate?: No Blood in urine?: No Urinary tract infection?: No Sexually transmitted disease?: No Injury to kidneys or bladder?: No Painful intercourse?: Yes Weak stream?: No Erection problems?: No Penile pain?: Yes  Gastrointestinal Nausea?: No Vomiting?: No Indigestion/heartburn?: No Diarrhea?: No Constipation?: Yes  Constitutional Fever: No Night sweats?: No Weight loss?: No Fatigue?: No  Skin Skin rash/lesions?: Yes Itching?: Yes  Eyes Blurred vision?: No Double vision?: No  Ears/Nose/Throat Sore throat?: No Sinus problems?: No  Hematologic/Lymphatic Swollen glands?: No Easy bruising?: No  Cardiovascular Leg swelling?: No Chest pain?: No  Respiratory Cough?: No Shortness of breath?: No  Endocrine Excessive thirst?: No  Musculoskeletal Back pain?: Yes Joint pain?: No  Neurological Headaches?: No Dizziness?: No  Psychologic Depression?: No Anxiety?: Yes  Physical Exam: BP 124/83 mmHg  Pulse 91  Ht 5\' 6"  (1.676 m)  Wt 256 lb 11.2 oz (116.438 kg)  BMI 41.45  kg/m2  Constitutional:  Alert and oriented, No acute distress. HEENT: [place] AT, moist mucus membranes.  Trachea midline, no masses. Cardiovascular: No clubbing, cyanosis, or edema. Respiratory: Normal respiratory effort, no increased work of breathing. GI: Abdomen is soft, nontender, nondistended, no abdominal masses GU:  CVA tenderness. Bilateral varicoceles normal testes uncircumcised glans normal penile shaft normal. Rectal exam reveals prostatitis tight rectal sphincter no rectal masses. No hernias. Skin: No rashes, bruises or suspicious lesions. Lymph: No cervical or inguinal adenopathy. Neurologic: Grossly  intact, no focal deficits, moving all 4 extremities. Psychiatric: Normal mood and affect.  Laboratory Data: Lab Results  Component Value Date   WBC 10.9* 11/24/2014   HGB 15.0 11/24/2014   HCT 45.3 11/24/2014   MCV 92.3 11/24/2014   PLT 203 11/24/2014    Lab Results  Component Value Date   CREATININE 1.10 11/24/2014    No results found for: PSA  No results found for: TESTOSTERONE  No results found for: HGBA1C  Urinalysis    Component Value Date/Time   COLORURINE YELLOW* 11/24/2014 1751   COLORURINE Yellow 07/05/2014 1536   APPEARANCEUR CLEAR* 11/24/2014 1751   APPEARANCEUR Clear 07/05/2014 1536   LABSPEC 1.024 11/24/2014 1751   LABSPEC 1.026 07/05/2014 1536   PHURINE 5.0 11/24/2014 1751   PHURINE 5.0 07/05/2014 1536   GLUCOSEU Negative 12/18/2014 1513   GLUCOSEU Negative 07/05/2014 1536   HGBUR NEGATIVE 11/24/2014 1751   HGBUR Negative 07/05/2014 1536   BILIRUBINUR Negative 12/18/2014 1513   BILIRUBINUR NEGATIVE 11/24/2014 1751   BILIRUBINUR Negative 07/05/2014 1536   KETONESUR NEGATIVE 11/24/2014 1751   KETONESUR Trace 07/05/2014 1536   PROTEINUR NEGATIVE 11/24/2014 1751   PROTEINUR Negative 07/05/2014 1536   NITRITE Negative 12/18/2014 1513   NITRITE NEGATIVE 11/24/2014 1751   NITRITE Negative 07/05/2014 1536   LEUKOCYTESUR Negative 12/18/2014 1513   LEUKOCYTESUR NEGATIVE 11/24/2014 1751   LEUKOCYTESUR Negative 07/05/2014 1536    Perti nent Imaging: PVR 65 mL  Assessment & Plan: Patient has chronic prostatitis with pain we created and his penis on palpation of his prostate. This is why he has pain at the end of sex. I will treat him with doxycycline 100 mg daily for 2 months to see him in 3 months. His testicular pain is secondary to bilateral varicoceles. I do not for posterior treat them at this time but proposed to wait and see how he does with his doxycycline and treatment of his prostatitis for his bilateral flank pain I propose to hold off on any kind  of imaging as patient has no insurance does not want to pay for a CAT scan at this time.  1. Penile pain - Urinalysis, Complete  2. Testicle pain  3. Chronic prostatitis 4. Bilateral varicocele 5 chronic prostatitis  - Urinalysis, Complete - doxycycline (VIBRAMYCIN) 100 MG capsule; Take 1 capsule (100 mg total) by mouth daily.  Dispense: 60 capsule; Refill: 1   Return in about 3 months (around 03/20/2015), or if symptoms worsen or fail to improve, for Reevaluation testicular pain and prostatitis.  Lorraine Lax, MD  [facility] [address] [place], [place] [address] [phone]

## 2014-12-31 ENCOUNTER — Encounter: Payer: Self-pay | Admitting: Emergency Medicine

## 2014-12-31 ENCOUNTER — Emergency Department
Admission: EM | Admit: 2014-12-31 | Discharge: 2014-12-31 | Discharge status: Against Medical Advice | Payer: Self-pay | Attending: Emergency Medicine | Admitting: Emergency Medicine

## 2014-12-31 DIAGNOSIS — R509 Fever, unspecified: Secondary | ICD-10-CM | POA: Insufficient documentation

## 2014-12-31 DIAGNOSIS — Z720 Tobacco use: Secondary | ICD-10-CM | POA: Insufficient documentation

## 2014-12-31 DIAGNOSIS — J029 Acute pharyngitis, unspecified: Secondary | ICD-10-CM | POA: Insufficient documentation

## 2014-12-31 NOTE — ED Notes (Signed)
Pt reports fever x3 days, sore throat since Sunday and "white bumps" around lips for unknown amount of time. Pt denies taking any fever reducer medication.

## 2015-01-26 ENCOUNTER — Emergency Department
Admission: EM | Admit: 2015-01-26 | Discharge: 2015-01-26 | Disposition: A | Discharge status: Home / Self Care | Payer: Self-pay | Attending: Student | Admitting: Student

## 2015-01-26 ENCOUNTER — Emergency Department: Payer: Self-pay

## 2015-01-26 ENCOUNTER — Encounter: Payer: Self-pay | Admitting: Emergency Medicine

## 2015-01-26 DIAGNOSIS — E669 Obesity, unspecified: Secondary | ICD-10-CM | POA: Insufficient documentation

## 2015-01-26 DIAGNOSIS — Z792 Long term (current) use of antibiotics: Secondary | ICD-10-CM | POA: Insufficient documentation

## 2015-01-26 DIAGNOSIS — B379 Candidiasis, unspecified: Secondary | ICD-10-CM

## 2015-01-26 DIAGNOSIS — B3749 Other urogenital candidiasis: Secondary | ICD-10-CM | POA: Insufficient documentation

## 2015-01-26 DIAGNOSIS — E119 Type 2 diabetes mellitus without complications: Secondary | ICD-10-CM | POA: Insufficient documentation

## 2015-01-26 DIAGNOSIS — I10 Essential (primary) hypertension: Secondary | ICD-10-CM | POA: Insufficient documentation

## 2015-01-26 DIAGNOSIS — Z720 Tobacco use: Secondary | ICD-10-CM | POA: Insufficient documentation

## 2015-01-26 DIAGNOSIS — N5082 Scrotal pain: Secondary | ICD-10-CM

## 2015-01-26 DIAGNOSIS — E039 Hypothyroidism, unspecified: Secondary | ICD-10-CM | POA: Insufficient documentation

## 2015-01-26 LAB — BASIC METABOLIC PANEL
Anion gap: 7 (ref 5–15)
BUN: 15 mg/dL (ref 6–20)
CALCIUM: 8.8 mg/dL — AB (ref 8.9–10.3)
CO2: 27 mmol/L (ref 22–32)
CREATININE: 1.16 mg/dL (ref 0.61–1.24)
Chloride: 106 mmol/L (ref 101–111)
GFR calc Af Amer: >60 mL/min (ref >60)
GFR calc non Af Amer: >60 mL/min (ref >60)
Glucose, Bld: 110 mg/dL — ABNORMAL HIGH (ref 65–99)
Potassium: 3.8 mmol/L (ref 3.5–5.1)
Sodium: 140 mmol/L (ref 135–145)

## 2015-01-26 LAB — URINALYSIS COMPLETE WITH MICROSCOPIC (ARMC ONLY)
Bilirubin Urine: NEGATIVE
GLUCOSE, UA: NEGATIVE mg/dL
Hgb urine dipstick: NEGATIVE
Leukocytes, UA: NEGATIVE
Nitrite: NEGATIVE
Protein, ur: 30 mg/dL — AB
SQUAMOUS EPITHELIAL / LPF: NONE SEEN
Specific Gravity, Urine: 1.03 (ref 1.005–1.030)
pH: 7 (ref 5.0–8.0)

## 2015-01-26 MED ORDER — TRAMADOL HCL 50 MG PO TABS
50.0000 mg | ORAL_TABLET | Freq: Once | ORAL | Status: AC
  Administered 2015-01-26: 50 mg via ORAL
  Filled 2015-01-26: qty 1

## 2015-01-26 MED ORDER — NYSTATIN-TRIAMCINOLONE 100000-0.1 UNIT/GM-% EX OINT: 1.0000 "application " | TOPICAL_OINTMENT | Freq: Two times a day (BID) | CUTANEOUS | Status: DC

## 2015-01-26 MED ORDER — TRAMADOL HCL 50 MG PO TABS: 50.0000 mg | ORAL_TABLET | Freq: Four times a day (QID) | ORAL | Status: DC | PRN

## 2015-01-26 NOTE — ED Provider Notes (Signed)
[facility] Emergency Department Provider Note  ____________________________________________  Time seen: Approximately 8:10 PM  I have reviewed the triage vital signs and the nursing notes.   HISTORY  Chief Complaint Groin Burn and Penile Discharge    HPI Ronald Montgomery is a 34 y.o. male patient complaining of penile irritation edematous scrotum disease active sexually and unprotected intercourse with 2 partners in the past 2 months. Patient also state he has a history of prostate irritation couple months ago and was given doxycycline was seems to help his back complaint. Patient state he is scheduled follow-up later in September but this pain is increasing prompted his visit to the emergency room. No palliative measures taken for this complaint. Patient is rating his pain as a 7/10. Past Medical History  Diagnosis Date  . Alcohol abuse   . Chlamydia   . Asthma   . Thyroid disorder     during childhood  . Constipation   . Scabies     There are no active problems to display for this patient.   Past Surgical History  Procedure Laterality Date  . Abcess drainage      groin area  . Rhytidectomy neck / cheek / chin      due to accident  . Cyst excision      top of head    Current Outpatient Rx  Name  Route  Sig  Dispense  Refill  . doxycycline (VIBRAMYCIN) 100 MG capsule   Oral   Take 1 capsule (100 mg total) by mouth daily.   60 capsule   1   . triamcinolone ointment (KENALOG) 0.5 %   Topical   Apply 1 application topically 2 (two) times daily. Patient not taking: Reported on 12/18/2014   30 g   0     Allergies Review of patient's allergies indicates no known allergies.  Family History  Problem Relation Age of Onset  . Thyroid cancer Maternal Grandfather   . Skin cancer Mother   . Heart attack Maternal Grandfather   . Diabetes Maternal Grandfather   . Diabetes Mother     Social History Social History  Substance Use Topics  .  Smoking status: Current Every Day Smoker -- 0.50 packs/day    Types: Cigarettes  . Smokeless tobacco: None  . Alcohol Use: 0.0 oz/week    0 Standard drinks or equivalent per week     Comment: ocasional    Review of Systems Constitutional: No fever/chills Eyes: No visual changes. ENT: No sore throat. Cardiovascular: Denies chest pain. Respiratory: Denies shortness of breath. Gastrointestinal: No abdominal pain.  No nausea, no vomiting.  No diarrhea.  No constipation. Genitourinary: Negative for dysuria. Scrotum edema and irritation to the perineal head Musculoskeletal: Negative for back pain. Skin: Negative for rash. Neurological: Negative for headaches, focal weakness or numbness. Endocrine: Hypothyroidism, hypertension, diabetes, and obesity 10-point ROS otherwise negative.  ____________________________________________   PHYSICAL EXAM:  VITAL SIGNS: ED Triage Vitals  Enc Vitals Group     BP 01/26/15 1913 134/97 mmHg     Pulse Rate 01/26/15 1913 85     Resp 01/26/15 1913 18     Temp 01/26/15 1913 97.5 F (36.4 C)     Temp Source 01/26/15 1913 Oral     SpO2 01/26/15 1910 99 %     Weight 01/26/15 1913 251 lb (113.853 kg)     Height 01/26/15 1913  (1.676 m)     Head Cir --  Peak Flow --      Pain Score 01/26/15 1914 7     Pain Loc --      Pain Edu? --      Excl. in GC? --     Constitutional: Alert and oriented. Well appearing and in no acute distress. Eyes: Conjunctivae are normal. PERRL. EOMI. Head: Atraumatic. Nose: No congestion/rhinnorhea. Mouth/Throat: Mucous membranes are moist.  Oropharynx non-erythematous. Neck: No stridor.  No cervical spine tenderness to palpation. Hematological/Lymphatic/Immunilogical: No cervical lymphadenopathy. Cardiovascular: Normal rate, regular rhythm. Grossly normal heart sounds.  Good peripheral circulation. Respiratory: Normal respiratory effort.  No retractions. Lungs CTAB. Gastrointestinal: Soft and nontender. No  distention. No abdominal bruits. No CVA tenderness. Genitourinary: Uncircumcised male. No external perineal lesions. Edematous but not erythematous scrotum. Whitish coating under foreskin of penis. Musculoskeletal: No lower extremity tenderness nor edema.  No joint effusions. Neurologic:  Normal speech and language. No gross focal neurologic deficits are appreciated. No gait instability. Skin:  Skin is warm, dry and intact. No rash noted. Psychiatric: Mood and affect are normal. Speech and behavior are normal.  ____________________________________________   LABS (all labs ordered are listed, but only abnormal results are displayed)  Labs Reviewed  BASIC METABOLIC PANEL - Abnormal; Notable for the following:    Glucose, Bld 110 (*)    Calcium 8.8 (*)    All other components within normal limits  URINALYSIS COMPLETEWITH MICROSCOPIC ([facility] ONLY) - Abnormal; Notable for the following:    Color, Urine YELLOW (*)    APPearance CLEAR (*)    Ketones, ur TRACE (*)    Protein, ur 30 (*)    Bacteria, UA RARE (*)    All other components within normal limits   ____________________________________________  EKG   ____________________________________________  RADIOLOGY  Ultrasound of the scrotum and testicles unremarkable. ____________________________________________   PROCEDURES  Procedure(s) performed: None  Critical Care performed: No  ____________________________________________   INITIAL IMPRESSION / ASSESSMENT AND PLAN / ED COURSE  Pertinent labs & imaging results that were available during my care of the patient were reviewed by me and considered in my medical decision making (see chart for details). Discussed negative ultrasound findings with patient.  Scrotum pain and yeast infection. Patient also has a yeast infection under the foreskin. Advised to follow-up with urology in the next 2-3 days if his symptoms worsens. Patient given a three-day prescription for  tramadol. ____________________________________________   FINAL CLINICAL IMPRESSION(S) / ED DIAGNOSES  Final diagnoses:  Yeast infection      Joni Reining, PA-C 01/26/15 1610  Gayla Doss, MD 01/26/15 [phone]

## 2015-01-26 NOTE — ED Notes (Signed)
Pt arrived to the ED for complaints for penile discharge and penile irritation. Pt states that he is sexually active and has had unprotected sex with 2 partners in the last 2 months. Pt is AOx4 in no apparent distress.

## 2015-01-26 NOTE — Discharge Instructions (Signed)
Scrotal Swelling Scrotal swelling may occur on one or both sides of the scrotum. Pain may also occur with swelling. Possible causes of scrotal swelling include:   Injury.  Infection.  An ingrown hair or abrasion in the area.  Repeated rubbing from tight-fitting underwear.  Poor hygiene.  A weakened area in the muscles around the groin (hernia). A hernia can allow abdominal contents to push into the scrotum.  Fluid around the testicle (hydrocele).  Enlarged vein around the testicle (varicocele).  Certain medical treatments or existing conditions.  A recent genital surgery or procedure.  The spermatic cord becomes twisted in the scrotum, which cuts off blood supply (testicular torsion).  Testicular cancer. HOME CARE INSTRUCTIONS Once the cause of your scrotal swelling has been determined, you may be asked to monitor your scrotum for any changes. The following actions may help to alleviate any discomfort you are experiencing:  Rest and limit activity until the swelling goes away. Lying down is the preferred position.  Put ice on the scrotum:  Put ice in a plastic bag.  Place a towel between your skin and the bag.  Leave the ice on for 20 minutes, 2-3 times a day for 1-2 days.  Place a rolled towel under the testicles for support.  Wear loose-fitting clothing or an athletic support cup for comfort.  Take all medicines as directed by your health care provider.  Perform a monthly self-exam of the scrotum and penis. Feel for changes. Ask your health care provider how to perform a monthly self-exam if you are unsure. SEEK MEDICAL CARE IF:  You have a sudden (acute) onset of pain that is persistent and not improving.  You notice a heavy feeling or fluid in the scrotum.  You have pain or burning while urinating.  You have blood in the urine or semen.  You feel a lump around the testicle.  You notice that one testicle is larger than the other (slight variation is  normal).  You have a persistent dull ache or pain in the groin or scrotum. SEEK IMMEDIATE MEDICAL CARE IF:  The pain does not go away or becomes severe.  You have a fever or shaking chills.  You have pain or vomiting that cannot be controlled.  You notice significant redness or swelling of one or both sides of the scrotum.  You experience redness spreading upward from your scrotum to your abdomen or downward from your scrotum to your thighs. MAKE SURE YOU:  Understand these instructions.  Will watch your condition.  Will get help right away if you are not doing well or get worse. Document Released: 06/18/2010 Document Revised: 01/16/2013 Document Reviewed: 10/18/2012 Exit[employer] Patient Information 2015 [employer]. This information is not intended to replace advice given to you by your health care provider. Make sure you discuss any questions you have with your health care provider.  

## 2015-01-26 NOTE — ED Notes (Signed)
Pt's mother updated of status per pt permission. Mother not courteous and verbally offensive to the staff. Mother made aware of facility policy and that she can contact her son directly.

## 2015-02-03 ENCOUNTER — Encounter: Payer: Self-pay | Admitting: Urology

## 2015-02-03 ENCOUNTER — Ambulatory Visit (INDEPENDENT_AMBULATORY_CARE_PROVIDER_SITE_OTHER): Payer: Self-pay | Admitting: Urology

## 2015-02-03 VITALS — BP 120/84 | HR 80 | Ht 66.0 in | Wt 259.4 lb

## 2015-02-03 DIAGNOSIS — N509 Disorder of male genital organs, unspecified: Secondary | ICD-10-CM | POA: Insufficient documentation

## 2015-02-03 DIAGNOSIS — N508 Other specified disorders of male genital organs: Secondary | ICD-10-CM

## 2015-02-03 DIAGNOSIS — N4889 Other specified disorders of penis: Secondary | ICD-10-CM

## 2015-02-03 DIAGNOSIS — IMO0002 Reserved for concepts with insufficient information to code with codable children: Secondary | ICD-10-CM

## 2015-02-03 HISTORY — DX: Reserved for concepts with insufficient information to code with codable children: IMO0002

## 2015-02-03 HISTORY — DX: Other specified disorders of penis: N48.89

## 2015-02-03 NOTE — Progress Notes (Signed)
02/03/2015 11:32 AM   Luciano Cutter Mar 06, 1981 [id number]  Referring provider: No referring provider defined for this encounter.  No chief complaint on file.   HPI:                                    1 - Penoscrotal Pain - pt with flairs of bilateral inguinal discomfort with some radiation to penoscrotal area, usually rose when leaned over for hours at a time at work and improved by being upright / stretching. Scrotal US normal 2013 and 2016. No LE numbness or h/o lumbago. Overall minor bother. Was given round of empiric doxycycline with minimal change.  He has h/o numerous ER visits with various somatic complaints and negative workups.  Today "Roe Coombs" is seen in f/u above.   PMH: Past Medical History  Diagnosis Date  . Alcohol abuse   . Chlamydia   . Asthma   . Thyroid disorder     during childhood  . Constipation   . Scabies     Surgical History: Past Surgical History  Procedure Laterality Date  . Abcess drainage      groin area  . Rhytidectomy neck / cheek / chin      due to accident  . Cyst excision      top of head    Home Medications:    Medication List       This list is accurate as of: 02/03/15 11:32 AM.  Always use your most recent med list.               doxycycline 100 MG capsule  Commonly known as:  VIBRAMYCIN  Take 1 capsule (100 mg total) by mouth daily.     nystatin-triamcinolone ointment  Commonly known as:  MYCOLOG  Apply 1 application topically 2 (two) times daily.     traMADol 50 MG tablet  Commonly known as:  ULTRAM  Take 1 tablet (50 mg total) by mouth every 6 (six) hours as needed for moderate pain.     triamcinolone ointment 0.5 %  Commonly known as:  KENALOG  Apply 1 application topically 2 (two) times daily.        Allergies: No Known Allergies  Family History: Family History  Problem Relation Age of Onset  . Thyroid cancer Maternal Grandfather   . Skin cancer Mother   . Heart attack Maternal Grandfather   .  Diabetes Maternal Grandfather   . Diabetes Mother     Social History:  reports that he has been smoking Cigarettes.  He has been smoking about 0.50 packs per day. He does not have any smokeless tobacco history on file. He reports that he drinks alcohol. He reports that he uses illicit drugs (Marijuana).  ROS:        Review of Systems  Gastrointestinal (upper)  : Negative for upper GI symptoms  Gastrointestinal (lower) : Negative for lower GI symptoms  Constitutional : Negative for symptoms  Skin: Negative for skin symptoms  Eyes: Negative for eye symptoms  Ear/Nose/Throat : Negative for Ear/Nose/Throat symptoms  Hematologic/Lymphatic: Negative for Hematologic/Lymphatic symptoms  Cardiovascular : Negative for cardiovascular symptoms  Respiratory : Negative for respiratory symptoms  Endocrine: Negative for endocrine symptoms  Musculoskeletal: Negative for musculoskeletal symptoms  Neurological: Negative for neurological symptoms  Psychologic: Negative for psychiatric symptoms  Physical Exam: There were no vitals taken for this visit.  Constitutional:  Alert and oriented, No acute distress. HEENT: [place] AT, moist mucus membranes.  Trachea midline, no masses. Cardiovascular: No clubbing, cyanosis, or edema. Respiratory: Normal respiratory effort, no increased work of breathing. GI: Abdomen is soft, nontender, nondistended, no abdominal masses. Truncal obesity does limit sensitivity of exam.  GU: No CVA tenderness.  No scrotal masses or testicular TTP. No hernias. His bother is most replicated by hernia exam / palpation of inguinal canal bilateraly.  Skin: No rashes, bruises or suspicious lesions. Lymph: No cervical or inguinal adenopathy. Neurologic: Grossly intact, no focal deficits, moving all 4 extremities. Psychiatric: Normal mood and affect.  Laboratory Data: Lab Results  Component Value Date    WBC 10.9* 11/24/2014   HGB 15.0 11/24/2014   HCT 45.3 11/24/2014   MCV 92.3 11/24/2014   PLT 203 11/24/2014    Lab Results  Component Value Date   CREATININE 1.16 01/26/2015    No results found for: PSA  No results found for: TESTOSTERONE  No results found for: HGBA1C  Urinalysis    Component Value Date/Time   COLORURINE YELLOW* 01/26/2015 1922   COLORURINE Yellow 07/05/2014 1536   APPEARANCEUR CLEAR* 01/26/2015 1922   APPEARANCEUR Clear 07/05/2014 1536   LABSPEC 1.030 01/26/2015 1922   LABSPEC 1.026 07/05/2014 1536   PHURINE 7.0 01/26/2015 1922   PHURINE 5.0 07/05/2014 1536   GLUCOSEU NEGATIVE 01/26/2015 1922   GLUCOSEU Negative 07/05/2014 1536   HGBUR NEGATIVE 01/26/2015 1922   HGBUR Negative 07/05/2014 1536   BILIRUBINUR NEGATIVE 01/26/2015 1922   BILIRUBINUR Negative 12/18/2014 1513   BILIRUBINUR Negative 07/05/2014 1536   KETONESUR TRACE* 01/26/2015 1922   KETONESUR Trace 07/05/2014 1536   PROTEINUR 30* 01/26/2015 1922   PROTEINUR Negative 07/05/2014 1536   NITRITE NEGATIVE 01/26/2015 1922   NITRITE Negative 12/18/2014 1513   NITRITE Negative 07/05/2014 1536   LEUKOCYTESUR NEGATIVE 01/26/2015 1922   LEUKOCYTESUR Negative 12/18/2014 1513   LEUKOCYTESUR Negative 07/05/2014 1536    Pertinent Imaging: As per HPI  Assessment & Plan:    1 - Chronic Penoscrotal Pain - likely manifestation of some sort of chronic pain syndrome specrum disorder or simply MSK strain / sprian by hisotry.  Normal exams and pertinant imaging x several. Rec prn NSAIDS and staying active / continued stretching.  2 - F/u Urol prn.   Sebastian Ache, MD  [facility] [address], [address] [place], [place] [address] [phone]

## 2015-02-20 ENCOUNTER — Ambulatory Visit: Payer: Self-pay

## 2015-02-20 ENCOUNTER — Ambulatory Visit: Payer: Self-pay | Admitting: Urology

## 2015-04-27 ENCOUNTER — Emergency Department
Admission: EM | Admit: 2015-04-27 | Discharge: 2015-04-27 | Disposition: A | Discharge status: Home / Self Care | Payer: Self-pay | Attending: Emergency Medicine | Admitting: Emergency Medicine

## 2015-04-27 ENCOUNTER — Encounter: Payer: Self-pay | Admitting: Medical Oncology

## 2015-04-27 DIAGNOSIS — F17210 Nicotine dependence, cigarettes, uncomplicated: Secondary | ICD-10-CM | POA: Insufficient documentation

## 2015-04-27 DIAGNOSIS — R1030 Lower abdominal pain, unspecified: Secondary | ICD-10-CM | POA: Insufficient documentation

## 2015-04-27 DIAGNOSIS — R300 Dysuria: Secondary | ICD-10-CM | POA: Insufficient documentation

## 2015-04-27 DIAGNOSIS — Z7952 Long term (current) use of systemic steroids: Secondary | ICD-10-CM | POA: Insufficient documentation

## 2015-04-27 LAB — URINALYSIS COMPLETE WITH MICROSCOPIC (ARMC ONLY)
BACTERIA UA: NONE SEEN
Bilirubin Urine: NEGATIVE
GLUCOSE, UA: NEGATIVE mg/dL
Hgb urine dipstick: NEGATIVE
Ketones, ur: NEGATIVE mg/dL
Leukocytes, UA: NEGATIVE
Nitrite: NEGATIVE
Protein, ur: NEGATIVE mg/dL
Specific Gravity, Urine: 1.018 (ref 1.005–1.030)
pH: 5 (ref 5.0–8.0)

## 2015-04-27 LAB — CHLAMYDIA/NGC RT PCR (ARMC ONLY)
CHLAMYDIA TR: NOT DETECTED
N gonorrhoeae: NOT DETECTED

## 2015-04-27 MED ORDER — CEFTRIAXONE SODIUM 250 MG IJ SOLR
250.0000 mg | Freq: Once | INTRAMUSCULAR | Status: AC
  Administered 2015-04-27: 250 mg via INTRAMUSCULAR

## 2015-04-27 MED ORDER — CEFTRIAXONE SODIUM 250 MG IJ SOLR
INTRAMUSCULAR | Status: AC
  Administered 2015-04-27: 250 mg via INTRAMUSCULAR
  Filled 2015-04-27: qty 250

## 2015-04-27 MED ORDER — DOXYCYCLINE HYCLATE 100 MG PO CAPS: 100.0000 mg | ORAL_CAPSULE | Freq: Two times a day (BID) | ORAL | Status: DC

## 2015-04-27 NOTE — ED Notes (Signed)
Pt updated on results

## 2015-04-27 NOTE — ED Notes (Signed)
Lab called again about results and lab verbalized to RN, "the results will be ready in 5 minutes"

## 2015-04-27 NOTE — ED Notes (Signed)
Lab called to ask about delay on urine test. Lab reports they are just now starting the tests.

## 2015-04-27 NOTE — ED Provider Notes (Signed)
[facility] Emergency Department Provider Note   ____________________________________________  Time seen: 1825  I have reviewed the triage vital signs and the nursing notes.   HISTORY  Chief Complaint Testicle Pain and Dysuria   History limited by: Not Limited   HPI Ronald Montgomery is a 34 y.o. male with history of prostatitis who presents to the emergency department today because of full ejaculation and urination for the past 5 days. He states that he feels like when he attack states the pain starts in his penis and then goes through the "tubes" that connected to his scrotum. He describes it as a burning feels he also states that he has some pain with urination. He states that the symptoms are somewhat reminiscent of when he was diagnosed with prostatitis in the past. Additionally however he does state that he has had some sexual activity in the past couple of months. He denies any fevers, nausea or vomiting.   Past Medical History  Diagnosis Date  . Alcohol abuse   . Chlamydia   . Asthma   . Thyroid disorder     during childhood  . Constipation   . Scabies   . Testicular/scrotal pain 02/03/2015    Normal exam and [place] x several.    . Penile pain 02/03/2015    Normal exams x several     Patient Active Problem List   Diagnosis Date Noted  . Penile pain 02/03/2015  . Testicular/scrotal pain 02/03/2015    Past Surgical History  Procedure Laterality Date  . Abcess drainage      groin area  . Rhytidectomy neck / cheek / chin      due to accident  . Cyst excision      top of head    Current Outpatient Rx  Name  Route  Sig  Dispense  Refill  . doxycycline (VIBRAMYCIN) 100 MG capsule   Oral   Take 1 capsule (100 mg total) by mouth daily.   60 capsule   1   . nystatin-triamcinolone ointment (MYCOLOG)   Topical   Apply 1 application topically 2 (two) times daily. Patient not taking: Reported on 02/03/2015   30 g   0   . traMADol (ULTRAM) 50 MG  tablet   Oral   Take 1 tablet (50 mg total) by mouth every 6 (six) hours as needed for moderate pain. Patient not taking: Reported on 02/03/2015   12 tablet   0   . triamcinolone ointment (KENALOG) 0.5 %   Topical   Apply 1 application topically 2 (two) times daily.   30 g   0     Allergies Review of patient's allergies indicates no known allergies.  Family History  Problem Relation Age of Onset  . Thyroid cancer Maternal Grandfather   . Skin cancer Mother   . Heart attack Maternal Grandfather   . Diabetes Maternal Grandfather   . Diabetes Mother     Social History Social History  Substance Use Topics  . Smoking status: Current Every Day Smoker -- 0.50 packs/day    Types: Cigarettes  . Smokeless tobacco: None  . Alcohol Use: 0.0 oz/week    0 Standard drinks or equivalent per week     Comment: ocasional    Review of Systems  Constitutional: Negative for fever. Cardiovascular: Negative for chest pain. Respiratory: Negative for shortness of breath. Gastrointestinal: Negative for abdominal pain, vomiting and diarrhea. Genitourinary: Positive for dysuria  Musculoskeletal: Negative for back pain. Skin: Negative  for rash. Neurological: Negative for headaches, focal weakness or numbness.   10-point ROS otherwise negative.  ____________________________________________   PHYSICAL EXAM:  VITAL SIGNS: ED Triage Vitals  Enc Vitals Group     BP 04/27/15 1743 131/75 mmHg     Pulse Rate 04/27/15 1743 88     Resp 04/27/15 1743 18     Temp 04/27/15 1743 98.3 F (36.8 C)     Temp Source 04/27/15 1743 Oral     SpO2 04/27/15 1743 97 %     Weight 04/27/15 1743 260 lb (117.935 kg)     Height 04/27/15 1743 5\' 6"  (1.676 m)     Head Cir --      Peak Flow --      Pain Score 04/27/15 1743 8   Constitutional: Alert and oriented. Well appearing and in no distress. Eyes: Conjunctivae are normal. PERRL. Normal extraocular movements. ENT   Head: Normocephalic and  atraumatic.   Nose: No congestion/rhinnorhea.   Mouth/Throat: Mucous membranes are moist.   Neck: No stridor. Hematological/Lymphatic/Immunilogical: No cervical lymphadenopathy. Cardiovascular: Normal rate, regular rhythm.  No murmurs, rubs, or gallops. Respiratory: Normal respiratory effort without tachypnea nor retractions. Breath sounds are clear and equal bilaterally. No wheezes/rales/rhonchi. Gastrointestinal: Soft and nontender. No distention. There is no CVA tenderness. Rectal: Prostate firm. No tenderness to palpation.  Genitourinary: No external lesions. Uncircumcised penis. Bilateral descended testicles. No appreciable swelling. No tenderness to palpation. Musculoskeletal: Normal range of motion in all extremities. No joint effusions.  No lower extremity tenderness nor edema. Neurologic:  Normal speech and language. No gross focal neurologic deficits are appreciated.  Skin:  Skin is warm, dry and intact. No rash noted. Psychiatric: Mood and affect are normal. Speech and behavior are normal. Patient exhibits appropriate insight and judgment.  ____________________________________________    LABS (pertinent positives/negatives)  Labs Reviewed  URINALYSIS COMPLETEWITH MICROSCOPIC ([facility] ONLY) - Abnormal; Notable for the following:    Color, Urine YELLOW (*)    APPearance CLEAR (*)    Squamous Epithelial / LPF 0-5 (*)    All other components within normal limits  CHLAMYDIA/NGC RT PCR ([facility] ONLY)     ____________________________________________   EKG  None  ____________________________________________    RADIOLOGY  None   ____________________________________________   PROCEDURES  Procedure(s) performed: None  Critical Care performed: No  ____________________________________________   INITIAL IMPRESSION / ASSESSMENT AND PLAN / ED COURSE  Pertinent labs & imaging results that were available during my care of the patient were reviewed by  me and considered in my medical decision making (see chart for details).  Patient presented to the emergency department today because of concerns for pain with ejaculation and urination. On exam no prostate bogginess or tenderness. Testicles are without tenderness or swelling. Unclear etiology at this point. UA without concerning findings. Patient did choose to leave prior to the results of the chlamydia and gonorrhea test. However given that he had been sexually active I did asked that he receive a dose of ceftriaxone and gave prescription for doxycycline.  ____________________________________________   FINAL CLINICAL IMPRESSION(S) / ED DIAGNOSES  Final diagnoses:  Groin pain, unspecified laterality     Phineas SemenGraydon Ivyonna Hoelzel, MD 04/27/15 2141

## 2015-04-27 NOTE — Discharge Instructions (Signed)
Please seek medical attention for any high fevers, chest pain, shortness of breath, change in behavior, persistent vomiting, bloody stool or any other new or concerning symptoms.  Prostatitis The prostate gland is about the size and shape of a walnut. It is located just below your bladder. It produces one of the components of semen, which is made up of sperm and the fluids that help nourish and transport it out from the testicles. Prostatitis is inflammation of the prostate gland.  There are four types of prostatitis:  Acute bacterial prostatitis. This is the least common type of prostatitis. It starts quickly and usually is associated with a bladder infection, high fever, and shaking chills. It can occur at any age.  Chronic bacterial prostatitis. This is a persistent bacterial infection in the prostate. It usually develops from repeated acute bacterial prostatitis or acute bacterial prostatitis that was not properly treated. It can occur in men of any age but is most common in middle-aged men whose prostate has begun to enlarge. The symptoms are not as severe as those in acute bacterial prostatitis. Discomfort in the part of your body that is in front of your rectum and below your scrotum (perineum), lower abdomen, or in the head of your penis (glans) may represent your primary discomfort.  Chronic prostatitis (nonbacterial). This is the most common type of prostatitis. It is inflammation of the prostate gland that is not caused by a bacterial infection. The cause is unknown and may be associated with a viral infection or autoimmune disorder.  Prostatodynia (pelvic floor disorder). This is associated with increased muscular tone in the pelvis surrounding the prostate. CAUSES The causes of bacterial prostatitis are bacterial infection. The causes of the other types of prostatitis are unknown.  SYMPTOMS  Symptoms can vary depending upon the type of prostatitis that exists. There can also be overlap  in symptoms. Possible symptoms for each type of prostatitis are listed below. Acute Bacterial Prostatitis  Painful urination.  Fever or chills.  Muscle or joint pains.  Low back pain.  Low abdominal pain.  Inability to empty bladder completely. Chronic Bacterial Prostatitis, Chronic Nonbacterial Prostatitis, and Prostatodynia  Sudden urge to urinate.  Frequent urination.  Difficulty starting urine stream.  Weak urine stream.  Discharge from the urethra.  Dribbling after urination.  Rectal pain.  Pain in the testicles, penis, or tip of the penis.  Pain in the perineum.  Problems with sexual function.  Painful ejaculation.  Bloody semen. DIAGNOSIS  In order to diagnose prostatitis, your health care provider will ask about your symptoms. One or more urine samples will be taken and tested (urinalysis). If the urinalysis result is negative for bacteria, your health care provider may use a finger to feel your prostate (digital rectal exam). This exam helps your health care provider determine if your prostate is swollen and tender. It will also produce a specimen of semen that can be analyzed. TREATMENT  Treatment for prostatitis depends on the cause. If a bacterial infection is the cause, it can be treated with antibiotic medicine. In cases of chronic bacterial prostatitis, the use of antibiotics for up to 1 month or 6 weeks may be necessary. Your health care provider may instruct you to take sitz baths to help relieve pain. A sitz bath is a bath of hot water in which your hips and buttocks are under water. This relaxes the pelvic floor muscles and often helps to relieve the pressure on your prostate. HOME CARE INSTRUCTIONS   Take  all medicines as directed by your health care provider.  Take sitz baths as directed by your health care provider. SEEK MEDICAL CARE IF:   Your symptoms get worse, not better.  You have a fever. SEEK IMMEDIATE MEDICAL CARE IF:   You have  chills.  You feel nauseous or vomit.  You feel lightheaded or faint.  You are unable to urinate.  You have blood or blood clots in your urine. MAKE SURE YOU:  Understand these instructions.  Will watch your condition.  Will get help right away if you are not doing well or get worse.   This information is not intended to replace advice given to you by your health care provider. Make sure you discuss any questions you have with your health care provider.   Document Released: 05/13/2000 Document Revised: 06/06/2014 Document Reviewed: 12/03/2012 Elsevier Interactive Patient Education [employer]

## 2015-04-27 NOTE — ED Notes (Signed)
Pt reports that he has been having burning in his scrotum when he ejaculates x 5 days. Also reports some burning with urination.

## 2015-04-27 NOTE — ED Notes (Signed)
Scrotal "burning, lava" pain with ejaculation.

## 2015-04-27 NOTE — ED Notes (Signed)
Pt reports he can not wait any longer and requests to be treated for suspected dx rather than wait for test results. Pt has left phone number with RN and requested phone call after results are ready.

## 2015-04-27 NOTE — ED Notes (Signed)
RN called lab again and was told urine is being tested at this time. Pt has been updated on delay in results.

## 2015-05-28 ENCOUNTER — Emergency Department
Admission: EM | Admit: 2015-05-28 | Discharge: 2015-05-28 | Disposition: A | Discharge status: Home / Self Care | Payer: Self-pay | Attending: Emergency Medicine | Admitting: Emergency Medicine

## 2015-05-28 ENCOUNTER — Emergency Department: Payer: Self-pay

## 2015-05-28 DIAGNOSIS — Z7952 Long term (current) use of systemic steroids: Secondary | ICD-10-CM | POA: Insufficient documentation

## 2015-05-28 DIAGNOSIS — Z792 Long term (current) use of antibiotics: Secondary | ICD-10-CM | POA: Insufficient documentation

## 2015-05-28 DIAGNOSIS — F17210 Nicotine dependence, cigarettes, uncomplicated: Secondary | ICD-10-CM | POA: Insufficient documentation

## 2015-05-28 DIAGNOSIS — W108XXA Fall (on) (from) other stairs and steps, initial encounter: Secondary | ICD-10-CM | POA: Insufficient documentation

## 2015-05-28 DIAGNOSIS — S8002XA Contusion of left knee, initial encounter: Secondary | ICD-10-CM | POA: Insufficient documentation

## 2015-05-28 DIAGNOSIS — Y9389 Activity, other specified: Secondary | ICD-10-CM | POA: Insufficient documentation

## 2015-05-28 DIAGNOSIS — Y998 Other external cause status: Secondary | ICD-10-CM | POA: Insufficient documentation

## 2015-05-28 DIAGNOSIS — Z791 Long term (current) use of non-steroidal anti-inflammatories (NSAID): Secondary | ICD-10-CM | POA: Insufficient documentation

## 2015-05-28 DIAGNOSIS — Y9289 Other specified places as the place of occurrence of the external cause: Secondary | ICD-10-CM | POA: Insufficient documentation

## 2015-05-28 MED ORDER — DICLOFENAC SODIUM 75 MG PO TBEC: 75.0000 mg | DELAYED_RELEASE_TABLET | Freq: Two times a day (BID) | ORAL | Status: DC

## 2015-05-28 NOTE — Discharge Instructions (Signed)
Contusion A contusion is a deep bruise. Contusions happen when an injury causes bleeding under the skin. Symptoms of bruising include pain, swelling, and discolored skin. The skin may turn blue, purple, or yellow. HOME CARE   Rest the injured area.  If told, put ice on the injured area.  Put ice in a plastic bag.  Place a towel between your skin and the bag.  Leave the ice on for 20 minutes, 2-3 times per day.  If told, put light pressure (compression) on the injured area using an elastic bandage. Make sure the bandage is not too tight. Remove it and put it back on as told by your doctor.  If possible, raise (elevate) the injured area above the level of your heart while you are sitting or lying down.  Take over-the-counter and prescription medicines only as told by your doctor. GET HELP IF:  Your symptoms do not get better after several days of treatment.  Your symptoms get worse.  You have trouble moving the injured area. GET HELP RIGHT AWAY IF:   You have very bad pain.  You have a loss of feeling (numbness) in a hand or foot.  Your hand or foot turns pale or cold.   This information is not intended to replace advice given to you by your health care provider. Make sure you discuss any questions you have with your health care provider.   Document Released: 11/02/2007 Document Revised: 02/04/2015 Document Reviewed: 10/01/2014 Elsevier Interactive Patient Education [employer] Inc[employer]    Follow-up with Dr. Monica MartinezMinz if no improvement in 2-3 weeks. Diclofenac with food twice a day Ice to knee 3 times a day

## 2015-05-28 NOTE — ED Notes (Signed)
States he fell on left knee about 1 month ago has been having pain to same knee for about 2 weeks  States pain is lateral and posterior  No swelling or deformity noted

## 2015-05-28 NOTE — ED Provider Notes (Signed)
[facility] Emergency Department Provider Note  ____________________________________________  Time seen: Approximately 9:46 AM  I have reviewed the triage vital signs and the nursing notes.   HISTORY  Chief Complaint Knee Pain  HPI Ronald Montgomery is a 34 y.o. male is here complaining of left knee pain for one month. Patient states that approximately 3 weeks ago he fell going down the steps and landed on linoleum. Since that time he has continued to have knee pain and is taken over-the-counter medication without any relief. He has continued to ambulate, he has seen no swelling or deformity. He has not seen his primary care doctor prior to this visit. Currently he rates his pain as 8 out of 10. Pain is worse with standing or walking.   Past Medical History  Diagnosis Date  . Alcohol abuse   . Chlamydia   . Asthma   . Thyroid disorder     during childhood  . Constipation   . Scabies   . Testicular/scrotal pain 02/03/2015    Normal exam and US x several.    . Penile pain 02/03/2015    Normal exams x several     Patient Active Problem List   Diagnosis Date Noted  . Penile pain 02/03/2015  . Testicular/scrotal pain 02/03/2015    Past Surgical History  Procedure Laterality Date  . Abcess drainage      groin area  . Rhytidectomy neck / cheek / chin      due to accident  . Cyst excision      top of head    Current Outpatient Rx  Name  Route  Sig  Dispense  Refill  . diclofenac (VOLTAREN) 75 MG EC tablet   Oral   Take 1 tablet (75 mg total) by mouth 2 (two) times daily.   20 tablet   0   . doxycycline (VIBRAMYCIN) 100 MG capsule   Oral   Take 1 capsule (100 mg total) by mouth 2 (two) times daily.   28 capsule   8   . nystatin-triamcinolone ointment (MYCOLOG)   Topical   Apply 1 application topically 2 (two) times daily. Patient not taking: Reported on 02/03/2015   30 g   0   . traMADol (ULTRAM) 50 MG tablet   Oral   Take 1 tablet (50 mg  total) by mouth every 6 (six) hours as needed for moderate pain. Patient not taking: Reported on 02/03/2015   12 tablet   0   . triamcinolone ointment (KENALOG) 0.5 %   Topical   Apply 1 application topically 2 (two) times daily.   30 g   0     Allergies Review of patient's allergies indicates no known allergies.  Family History  Problem Relation Age of Onset  . Thyroid cancer Maternal Grandfather   . Skin cancer Mother   . Heart attack Maternal Grandfather   . Diabetes Maternal Grandfather   . Diabetes Mother     Social History Social History  Substance Use Topics  . Smoking status: Current Every Day Smoker -- 0.50 packs/day    Types: Cigarettes  . Smokeless tobacco: None  . Alcohol Use: 0.0 oz/week    0 Standard drinks or equivalent per week     Comment: ocasional    Review of Systems Constitutional: No fever/chills Cardiovascular: Denies chest pain. Respiratory: Denies shortness of breath. Gastrointestinal: No abdominal pain.  No nausea, no vomiting.   Musculoskeletal: Negative for back pain. Positive left knee pain.  Skin: Negative for rash. Neurological: Negative for headaches, focal weakness or numbness.  10-point ROS otherwise negative.  ____________________________________________   PHYSICAL EXAM:  VITAL SIGNS: ED Triage Vitals  Enc Vitals Group     BP 05/28/15 0932 124/86 mmHg     Pulse Rate 05/28/15 0931 79     Resp 05/28/15 0931 18     Temp 05/28/15 0931 97.9 F (36.6 C)     Temp Source 05/28/15 0931 Oral     SpO2 05/28/15 0931 100 %     Weight 05/28/15 0931 270 lb (122.471 kg)     Height 05/28/15 0931  (1.676 m)     Head Cir --      Peak Flow --      Pain Score 05/28/15 0931 8     Pain Loc --      Pain Edu? --      Excl. in GC? --     Constitutional: Alert and oriented. Well appearing and in no acute distress. Eyes: Conjunctivae are normal. PERRL. EOMI. Head: Atraumatic. Nose: No congestion/rhinnorhea. Neck: No stridor.    Cardiovascular: Normal rate, regular rhythm. Grossly normal heart sounds.  Good peripheral circulation. Respiratory: Normal respiratory effort.  No retractions. Lungs CTAB. Gastrointestinal: Soft and nontender. No distention.  Musculoskeletal: Left knee negative for gross deformity, no edema, no effusion, no crepitus with range of motion. Ligaments are stable bilaterally. Neurologic:  Normal speech and language. No gross focal neurologic deficits are appreciated. No gait instability. Skin:  Skin is warm, dry and intact. No rash noted. Psychiatric: Mood and affect are normal. Speech and behavior are normal.  ____________________________________________   LABS (all labs ordered are listed, but only abnormal results are displayed)  Labs Reviewed - No data to display  RADIOLOGY  X-ray left knee per radiologist shows no acute abnormality. I, Tommi Rumps, personally viewed and evaluated these images (plain radiographs) as part of my medical decision making, as well as reviewing the written report by the radiologist. ____________________________________________   PROCEDURES  Procedure(s) performed: None  Critical Care performed: No  ____________________________________________   INITIAL IMPRESSION / ASSESSMENT AND PLAN / ED COURSE  Pertinent labs & imaging results that were available during my care of the patient were reviewed by me and considered in my medical decision making (see chart for details).  Patient was informed of x-ray results and patient is to follow-up with Dr. Rosita Kea  if any continued problems. Patient was started on Voltaren 75 mg twice a day with food. ____________________________________________   FINAL CLINICAL IMPRESSION(S) / ED DIAGNOSES  Final diagnoses:  Contusion of left knee, initial encounter      Tommi Rumps, PA-C 05/28/15 1333  Sharman Cheek, MD 05/28/15 1550

## 2015-05-28 NOTE — ED Notes (Signed)
Pt c/o left knee pain for the past 2 weeks, denies injury

## 2015-06-29 ENCOUNTER — Emergency Department
Admission: EM | Admit: 2015-06-29 | Discharge: 2015-06-29 | Disposition: A | Discharge status: Home / Self Care | Payer: Self-pay | Attending: Emergency Medicine | Admitting: Emergency Medicine

## 2015-06-29 ENCOUNTER — Emergency Department: Payer: Self-pay

## 2015-06-29 ENCOUNTER — Encounter: Payer: Self-pay | Admitting: Emergency Medicine

## 2015-06-29 DIAGNOSIS — Z791 Long term (current) use of non-steroidal anti-inflammatories (NSAID): Secondary | ICD-10-CM | POA: Insufficient documentation

## 2015-06-29 DIAGNOSIS — Z7952 Long term (current) use of systemic steroids: Secondary | ICD-10-CM | POA: Insufficient documentation

## 2015-06-29 DIAGNOSIS — F17210 Nicotine dependence, cigarettes, uncomplicated: Secondary | ICD-10-CM | POA: Insufficient documentation

## 2015-06-29 DIAGNOSIS — Z792 Long term (current) use of antibiotics: Secondary | ICD-10-CM | POA: Insufficient documentation

## 2015-06-29 DIAGNOSIS — J45901 Unspecified asthma with (acute) exacerbation: Secondary | ICD-10-CM | POA: Insufficient documentation

## 2015-06-29 DIAGNOSIS — J069 Acute upper respiratory infection, unspecified: Secondary | ICD-10-CM | POA: Insufficient documentation

## 2015-06-29 LAB — POCT RAPID STREP A: STREPTOCOCCUS, GROUP A SCREEN (DIRECT): NEGATIVE

## 2015-06-29 MED ORDER — ALBUTEROL SULFATE HFA 108 (90 BASE) MCG/ACT IN AERS: 2.0000 | INHALATION_SPRAY | Freq: Four times a day (QID) | RESPIRATORY_TRACT | Status: DC | PRN

## 2015-06-29 MED ORDER — ONDANSETRON HCL 4 MG/2ML IJ SOLN
INTRAMUSCULAR | Status: AC
Start: 2015-06-29 — End: 2015-06-30
  Filled 2015-06-29: qty 2

## 2015-06-29 MED ORDER — BENZONATATE 100 MG PO CAPS: 200.0000 mg | ORAL_CAPSULE | Freq: Three times a day (TID) | ORAL | Status: DC | PRN

## 2015-06-29 NOTE — ED Notes (Signed)
Pt to ed with c/o fever of "107" this am at home.  Pt states he has had cough, congestion, and sore throat.  Pt is afebrile at triage at 97.7.  Pt also states right ear pain. +smoker.

## 2015-06-29 NOTE — Discharge Instructions (Signed)
Upper Respiratory Infection, Adult Most upper respiratory infections (URIs) are caused by a virus. A URI affects the nose, throat, and upper air passages. The most common type of URI is often called "the common cold." HOME CARE   Take medicines only as told by your doctor.  Gargle warm saltwater or take cough drops to comfort your throat as told by your doctor.  Use a warm mist humidifier or inhale steam from a shower to increase air moisture. This may make it easier to breathe.  Drink enough fluid to keep your pee (urine) clear or pale yellow.  Eat soups and other clear broths.  Have a healthy diet.  Rest as needed.  Go back to work when your fever is gone or your doctor says it is okay.  You may need to stay home longer to avoid giving your URI to others.  You can also wear a face mask and wash your hands often to prevent spread of the virus.  Use your inhaler more if you have asthma.  Do not use any tobacco products, including cigarettes, chewing tobacco, or electronic cigarettes. If you need help quitting, ask your doctor. GET HELP IF:  You are getting worse, not better.  Your symptoms are not helped by medicine.  You have chills.  You are getting more short of breath.  You have brown or red mucus.  You have yellow or brown discharge from your nose.  You have pain in your face, especially when you bend forward.  You have a fever.  You have puffy (swollen) neck glands.  You have pain while swallowing.  You have white areas in the back of your throat. GET HELP RIGHT AWAY IF:   You have very bad or constant:  Headache.  Ear pain.  Pain in your forehead, behind your eyes, and over your cheekbones (sinus pain).  Chest pain.  You have long-lasting (chronic) lung disease and any of the following:  Wheezing.  Long-lasting cough.  Coughing up blood.  A change in your usual mucus.  You have a stiff neck.  You have changes in  your:  Vision.  Hearing.  Thinking.  Mood. MAKE SURE YOU:   Understand these instructions.  Will watch your condition.  Will get help right away if you are not doing well or get worse.   This information is not intended to replace advice given to you by your health care provider. Make sure you discuss any questions you have with your health care provider.   Document Released: 11/02/2007 Document Revised: 09/30/2014 Document Reviewed: 08/21/2013 Elsevier Interactive Patient Education 2016 [employer].   Discontinue smoking. Increase fluids and take Tylenol or ibuprofen as needed for fever or body aches. Tessalon as needed for cough every 8 hours. [facility] if any continued problems. You were also given a prescription for an inhaler as you  may end up needing to use it.

## 2015-06-29 NOTE — ED Provider Notes (Signed)
[facility] Emergency Department Provider Note  ____________________________________________  Time seen: Approximately 9:19 AM  I have reviewed the triage vital signs and the nursing notes.   HISTORY  Chief Complaint Cough and Nasal Congestion   HPI Ronald Montgomery is a 35 y.o. male is here with complaint of a fever, cough, congestion, and sore throat. Patient states this morning that he had a temperature of "107" at home. He also states his right ear has begun hurting. Patient is not taking any over-the-counter medication. He also gives a history that his son was treated for strep throat last week. Patient has a nonproductive cough and continues to smoke daily.   Past Medical History  Diagnosis Date  . Alcohol abuse   . Chlamydia   . Asthma   . Thyroid disorder     during childhood  . Constipation   . Scabies   . Testicular/scrotal pain 02/03/2015    Normal exam and [place] x several.    . Penile pain 02/03/2015    Normal exams x several     Patient Active Problem List   Diagnosis Date Noted  . Penile pain 02/03/2015  . Testicular/scrotal pain 02/03/2015    Past Surgical History  Procedure Laterality Date  . Abcess drainage      groin area  . Rhytidectomy neck / cheek / chin      due to accident  . Cyst excision      top of head    Current Outpatient Rx  Name  Route  Sig  Dispense  Refill  . albuterol (PROVENTIL HFA;VENTOLIN HFA) 108 (90 Base) MCG/ACT inhaler   Inhalation   Inhale 2 puffs into the lungs every 6 (six) hours as needed for wheezing or shortness of breath.   1 Inhaler   2   . benzonatate (TESSALON PERLES) 100 MG capsule   Oral   Take 2 capsules (200 mg total) by mouth 3 (three) times daily as needed for cough.   30 capsule   0   . diclofenac (VOLTAREN) 75 MG EC tablet   Oral   Take 1 tablet (75 mg total) by mouth 2 (two) times daily.   20 tablet   0   . doxycycline (VIBRAMYCIN) 100 MG capsule   Oral   Take 1 capsule  (100 mg total) by mouth 2 (two) times daily.   28 capsule   8   . nystatin-triamcinolone ointment (MYCOLOG)   Topical   Apply 1 application topically 2 (two) times daily. Patient not taking: Reported on 02/03/2015   30 g   0   . traMADol (ULTRAM) 50 MG tablet   Oral   Take 1 tablet (50 mg total) by mouth every 6 (six) hours as needed for moderate pain. Patient not taking: Reported on 02/03/2015   12 tablet   0   . triamcinolone ointment (KENALOG) 0.5 %   Topical   Apply 1 application topically 2 (two) times daily.   30 g   0     Allergies Review of patient's allergies indicates no known allergies.  Family History  Problem Relation Age of Onset  . Thyroid cancer Maternal Grandfather   . Heart attack Maternal Grandfather   . Diabetes Maternal Grandfather   . Skin cancer Mother   . Diabetes Mother     Social History Social History  Substance Use Topics  . Smoking status: Current Every Day Smoker -- 0.50 packs/day    Types: Cigarettes  . Smokeless  tobacco: None  . Alcohol Use: 0.0 oz/week    0 Standard drinks or equivalent per week     Comment: ocasional    Review of Systems Constitutional: Positive fever/positive chills Eyes: No visual changes. ENT: Positive sore throat. Cardiovascular: Denies chest pain. Respiratory: Denies shortness of breath. Gastrointestinal: No abdominal pain.  No nausea, no vomiting.  No diarrhea.  No constipation. Genitourinary: Negative for dysuria. Musculoskeletal: Negative for back pain. Skin: Negative for rash. Neurological: Negative for headaches, focal weakness or numbness.  10-point ROS otherwise negative.  ____________________________________________   PHYSICAL EXAM:  VITAL SIGNS: ED Triage Vitals  Enc Vitals Group     BP 06/29/15 0852 121/81 mmHg     Pulse Rate 06/29/15 0852 98     Resp 06/29/15 0852 18     Temp 06/29/15 0852 97.7 F (36.5 C)     Temp Source 06/29/15 0852 Oral     SpO2 06/29/15 0852 100 %      Weight 06/29/15 0852 270 lb (122.471 kg)     Height 06/29/15 0852  (1.676 m)     Head Cir --      Peak Flow --      Pain Score 06/29/15 0853 5     Pain Loc --      Pain Edu? --      Excl. in GC? --     Constitutional: Alert and oriented. Well appearing and in no acute distress. Eyes: Conjunctivae are normal. PERRL. EOMI. Head: Atraumatic. Nose: Mild congestion/rhinnorhea.  EACs are clear bilaterally. TMs are dull bilaterally, no erythema. Mouth/Throat: Mucous membranes are moist.  Oropharynx non-erythematous. Positive posterior drainage noted. Neck: No stridor.   Hematological/Lymphatic/Immunilogical: No cervical lymphadenopathy. Cardiovascular: Normal rate, regular rhythm. Grossly normal heart sounds.  Good peripheral circulation. Respiratory: Normal respiratory effort.  No retractions. Lungs with mild expiratory wheeze noted throughout. Patient does have a congested cough. Gastrointestinal: Soft and nontender. No distention.  Musculoskeletal: No lower extremity tenderness nor edema.  No joint effusions. Neurologic:  Normal speech and language. No gross focal neurologic deficits are appreciated. No gait instability. Skin:  Skin is warm, dry and intact. No rash noted. Psychiatric: Mood and affect are normal. Speech and behavior are normal.  ____________________________________________   LABS (all labs ordered are listed, but only abnormal results are displayed)  Labs Reviewed  POCT RAPID STREP A    RADIOLOGY  Chest x-ray per radiologist shows no evidence of pneumonia. ____________________________________________   PROCEDURES  Procedure(s) performed: None  Critical Care performed: No  ____________________________________________   INITIAL IMPRESSION / ASSESSMENT AND PLAN / ED COURSE  Pertinent labs & imaging results that were available during my care of the patient were reviewed by me and considered in my medical decision making (see chart for  details).  Patient was made aware that this most likely is viral and his comp K by the fact that he is smoker. He was given a prescription for albuterol inhaler and Tessalon Perles. He is to follow-up with [facility] if any continued problems. ____________________________________________   FINAL CLINICAL IMPRESSION(S) / ED DIAGNOSES  Final diagnoses:  Acute upper respiratory infection      Tommi Rumps, PA-C 06/29/15 1401  Jeanmarie Plant, MD 06/29/15 1526

## 2015-07-06 ENCOUNTER — Emergency Department
Admission: EM | Admit: 2015-07-06 | Discharge: 2015-07-06 | Disposition: A | Discharge status: Home / Self Care | Payer: Self-pay | Attending: Student | Admitting: Student

## 2015-07-06 ENCOUNTER — Encounter: Payer: Self-pay | Admitting: Medical Oncology

## 2015-07-06 DIAGNOSIS — Z79899 Other long term (current) drug therapy: Secondary | ICD-10-CM | POA: Insufficient documentation

## 2015-07-06 DIAGNOSIS — J028 Acute pharyngitis due to other specified organisms: Secondary | ICD-10-CM

## 2015-07-06 DIAGNOSIS — B9789 Other viral agents as the cause of diseases classified elsewhere: Secondary | ICD-10-CM

## 2015-07-06 DIAGNOSIS — Z792 Long term (current) use of antibiotics: Secondary | ICD-10-CM | POA: Insufficient documentation

## 2015-07-06 DIAGNOSIS — Z791 Long term (current) use of non-steroidal anti-inflammatories (NSAID): Secondary | ICD-10-CM | POA: Insufficient documentation

## 2015-07-06 DIAGNOSIS — J029 Acute pharyngitis, unspecified: Secondary | ICD-10-CM | POA: Insufficient documentation

## 2015-07-06 DIAGNOSIS — F17210 Nicotine dependence, cigarettes, uncomplicated: Secondary | ICD-10-CM | POA: Insufficient documentation

## 2015-07-06 MED ORDER — IBUPROFEN 800 MG PO TABS: 800.0000 mg | ORAL_TABLET | Freq: Three times a day (TID) | ORAL | Status: DC | PRN

## 2015-07-06 MED ORDER — DIPHENHYDRAMINE HCL 12.5 MG/5ML PO ELIX
25.0000 mg | ORAL_SOLUTION | Freq: Once | ORAL | Status: AC
  Administered 2015-07-06: 25 mg via ORAL
  Filled 2015-07-06: qty 10

## 2015-07-06 MED ORDER — LIDOCAINE VISCOUS 2 % MT SOLN: 5.0000 mL | Freq: Four times a day (QID) | OROMUCOSAL | Status: DC | PRN

## 2015-07-06 MED ORDER — PSEUDOEPH-BROMPHEN-DM 30-2-10 MG/5ML PO SYRP
5.0000 mL | ORAL_SOLUTION | Freq: Four times a day (QID) | ORAL | Status: DC | PRN
Start: 2015-07-06 — End: 2015-07-11

## 2015-07-06 MED ORDER — LIDOCAINE VISCOUS 2 % MT SOLN
15.0000 mL | Freq: Once | OROMUCOSAL | Status: AC
  Administered 2015-07-06: 15 mL via OROMUCOSAL
  Filled 2015-07-06: qty 15

## 2015-07-06 MED ORDER — IBUPROFEN 800 MG PO TABS
800.0000 mg | ORAL_TABLET | Freq: Once | ORAL | Status: AC
  Administered 2015-07-06: 800 mg via ORAL
  Filled 2015-07-06: qty 1

## 2015-07-06 NOTE — ED Notes (Signed)
Pt reports sore throat, fever and congestion for 2 days   Pt states it hurts to swallow.    Pt alert.

## 2015-07-06 NOTE — Discharge Instructions (Signed)

## 2015-07-06 NOTE — ED Notes (Signed)
Pt reports he has been having sore throat, fever, chills, cold sx's since yesterday.

## 2015-07-06 NOTE — ED Provider Notes (Signed)
[facility] Emergency Department Provider Note  ____________________________________________  Time seen: Approximately 6:11 PM  I have reviewed the triage vital signs and the nursing notes.   HISTORY  Chief Complaint Fever and Sore Throat    HPI Ronald Montgomery is a 35 y.o. male patient complaining of sore throat with fever and congestion for 2 days. Patient state [place] to swallow. Patient stated decreased food and fluid intake secondary to throat pain. No palliative measures except for using an inhaler for chest congestion. Patient rates his pain as a 9/10.   Past Medical History  Diagnosis Date  . Alcohol abuse   . Chlamydia   . Asthma   . Thyroid disorder     during childhood  . Constipation   . Scabies   . Testicular/scrotal pain 02/03/2015    Normal exam and [place] x several.    . Penile pain 02/03/2015    Normal exams x several     Patient Active Problem List   Diagnosis Date Noted  . Penile pain 02/03/2015  . Testicular/scrotal pain 02/03/2015    Past Surgical History  Procedure Laterality Date  . Abcess drainage      groin area  . Rhytidectomy neck / cheek / chin      due to accident  . Cyst excision      top of head    Current Outpatient Rx  Name  Route  Sig  Dispense  Refill  . albuterol (PROVENTIL HFA;VENTOLIN HFA) 108 (90 Base) MCG/ACT inhaler   Inhalation   Inhale 2 puffs into the lungs every 6 (six) hours as needed for wheezing or shortness of breath.   1 Inhaler   2   . benzonatate (TESSALON PERLES) 100 MG capsule   Oral   Take 2 capsules (200 mg total) by mouth 3 (three) times daily as needed for cough.   30 capsule   0   . brompheniramine-pseudoephedrine-DM 30-2-10 MG/5ML syrup   Oral   Take 5 mLs by mouth 4 (four) times daily as needed. Mixed with viscous lidocaine for swish and swallow.   120 mL   0   . diclofenac (VOLTAREN) 75 MG EC tablet   Oral   Take 1 tablet (75 mg total) by mouth 2 (two) times daily.   20  tablet   0   . doxycycline (VIBRAMYCIN) 100 MG capsule   Oral   Take 1 capsule (100 mg total) by mouth 2 (two) times daily.   28 capsule   8   . ibuprofen (ADVIL,MOTRIN) 800 MG tablet   Oral   Take 1 tablet (800 mg total) by mouth every 8 (eight) hours as needed.   30 tablet   0   . lidocaine (XYLOCAINE) 2 % solution   Mouth/Throat   Use as directed 5 mLs in the mouth or throat every 6 (six) hours as needed for mouth pain. Mixed with 5 mL of Bromfed-DM for swish and swallow.   100 mL   0   . nystatin-triamcinolone ointment (MYCOLOG)   Topical   Apply 1 application topically 2 (two) times daily. Patient not taking: Reported on 02/03/2015   30 g   0   . traMADol (ULTRAM) 50 MG tablet   Oral   Take 1 tablet (50 mg total) by mouth every 6 (six) hours as needed for moderate pain. Patient not taking: Reported on 02/03/2015   12 tablet   0   . triamcinolone ointment (KENALOG) 0.5 %  Topical   Apply 1 application topically 2 (two) times daily.   30 g   0     Allergies Review of patient's allergies indicates no known allergies.  Family History  Problem Relation Age of Onset  . Thyroid cancer Maternal Grandfather   . Heart attack Maternal Grandfather   . Diabetes Maternal Grandfather   . Skin cancer Mother   . Diabetes Mother     Social History Social History  Substance Use Topics  . Smoking status: Current Every Day Smoker -- 0.50 packs/day    Types: Cigarettes  . Smokeless tobacco: None  . Alcohol Use: 0.0 oz/week    0 Standard drinks or equivalent per week     Comment: ocasional    Review of Systems Constitutional: Fever and chills Eyes: No visual changes. ENT: No throat. Cardiovascular: Denies chest pain. Respiratory: Denies shortness of breath. Gastrointestinal: No abdominal pain.  No nausea, no vomiting.  No diarrhea.  No constipation. Genitourinary: Negative for dysuria. Musculoskeletal: Negative for back pain. Skin: Negative for  rash. Neurological: Positive for headaches, focal weakness or numbness.  .  ____________________________________________   PHYSICAL EXAM:  VITAL SIGNS: ED Triage Vitals  Enc Vitals Group     BP 07/06/15 1606 131/74 mmHg     Pulse Rate 07/06/15 1606 99     Resp 07/06/15 1606 20     Temp 07/06/15 1606 100.3 F (37.9 C)     Temp Source 07/06/15 1606 Oral     SpO2 07/06/15 1606 99 %     Weight 07/06/15 1606 270 lb (122.471 kg)     Height 07/06/15 1606  (1.676 m)     Head Cir --      Peak Flow --      Pain Score 07/06/15 1607 9     Pain Loc --      Pain Edu? --      Excl. in GC? --     Constitutional: Alert and oriented. Well appearing and in no acute distress. Eyes: Conjunctivae are normal. PERRL. EOMI. Head: Atraumatic. Nose: No congestion/rhinnorhea. Mouth/Throat: Mucous membranes are moist.  Oropharynx erythematous without exudative tonsils Neck: No stridor. No cervical spine tenderness to palpation. Hematological/Lymphatic/Immunilogical: No cervical lymphadenopathy. Cardiovascular: Normal rate, regular rhythm. Grossly normal heart sounds.  Good peripheral circulation. Respiratory: Normal respiratory effort.  No retractions. Lungs CTAB. Gastrointestinal: Soft and nontender. No distention. No abdominal bruits. No CVA tenderness. Musculoskeletal: No lower extremity tenderness nor edema.  No joint effusions. Neurologic:  Normal speech and language. No gross focal neurologic deficits are appreciated. No gait instability. Skin:  Skin is warm, dry and intact. No rash noted. Psychiatric: Mood and affect are normal. Speech and behavior are normal.  ____________________________________________   LABS (all labs ordered are listed, but only abnormal results are displayed)  Labs Reviewed - No data to  display ____________________________________________  EKG   ____________________________________________  RADIOLOGY   ____________________________________________   PROCEDURES  Procedure(s) performed: None  Critical Care performed: No  ____________________________________________   INITIAL IMPRESSION / ASSESSMENT AND PLAN / ED COURSE  Pertinent labs & imaging results that were available during my care of the patient were reviewed by me and considered in my medical decision making (see chart for details).  Viral pharyngitis. Discussed negative strep tests patient advised culture is pending. Patient given prescription for Bromfed-DM, viscous lidocaine, and ibuprofen. Patient [facility]. ____________________________________________   FINAL CLINICAL IMPRESSION(S) / ED DIAGNOSES  Final  diagnoses:  Acute viral pharyngitis      Joni Reining, PA-C 07/06/15 1828  Gayla Doss, MD 07/07/15 [phone]

## 2015-07-07 ENCOUNTER — Emergency Department: Payer: Self-pay | Admitting: Anesthesiology

## 2015-07-07 ENCOUNTER — Encounter: Payer: Self-pay | Admitting: Medical Oncology

## 2015-07-07 ENCOUNTER — Inpatient Hospital Stay
Admission: EM | Admit: 2015-07-07 | Discharge: 2015-07-11 | DRG: 153 | Disposition: A | Discharge status: Home / Self Care | Payer: Self-pay | Attending: Unknown Physician Specialty | Admitting: Unknown Physician Specialty

## 2015-07-07 ENCOUNTER — Encounter: Payer: Self-pay | Admitting: Student in an Organized Health Care Education/Training Program

## 2015-07-07 ENCOUNTER — Encounter
Admission: EM | Disposition: A | Discharge status: Home / Self Care | Payer: Self-pay | Source: Home / Self Care | Attending: Unknown Physician Specialty

## 2015-07-07 ENCOUNTER — Emergency Department
Admission: EM | Admit: 2015-07-07 | Discharge: 2015-07-07 | Disposition: A | Discharge status: Home / Self Care | Payer: Self-pay | Attending: Emergency Medicine | Admitting: Emergency Medicine

## 2015-07-07 ENCOUNTER — Emergency Department: Payer: Self-pay

## 2015-07-07 ENCOUNTER — Encounter: Payer: Self-pay | Admitting: *Deleted

## 2015-07-07 DIAGNOSIS — E871 Hypo-osmolality and hyponatremia: Secondary | ICD-10-CM | POA: Diagnosis present

## 2015-07-07 DIAGNOSIS — T380X5A Adverse effect of glucocorticoids and synthetic analogues, initial encounter: Secondary | ICD-10-CM | POA: Diagnosis present

## 2015-07-07 DIAGNOSIS — J9690 Respiratory failure, unspecified, unspecified whether with hypoxia or hypercapnia: Secondary | ICD-10-CM

## 2015-07-07 DIAGNOSIS — J45909 Unspecified asthma, uncomplicated: Secondary | ICD-10-CM | POA: Insufficient documentation

## 2015-07-07 DIAGNOSIS — F1290 Cannabis use, unspecified, uncomplicated: Secondary | ICD-10-CM | POA: Diagnosis present

## 2015-07-07 DIAGNOSIS — Z833 Family history of diabetes mellitus: Secondary | ICD-10-CM

## 2015-07-07 DIAGNOSIS — J0510 Acute epiglottitis without obstruction: Secondary | ICD-10-CM

## 2015-07-07 DIAGNOSIS — F17210 Nicotine dependence, cigarettes, uncomplicated: Secondary | ICD-10-CM | POA: Diagnosis present

## 2015-07-07 DIAGNOSIS — Z808 Family history of malignant neoplasm of other organs or systems: Secondary | ICD-10-CM

## 2015-07-07 DIAGNOSIS — R739 Hyperglycemia, unspecified: Secondary | ICD-10-CM | POA: Diagnosis present

## 2015-07-07 DIAGNOSIS — L0291 Cutaneous abscess, unspecified: Secondary | ICD-10-CM

## 2015-07-07 DIAGNOSIS — J029 Acute pharyngitis, unspecified: Secondary | ICD-10-CM | POA: Insufficient documentation

## 2015-07-07 DIAGNOSIS — J0511 Acute epiglottitis with obstruction: Principal | ICD-10-CM | POA: Diagnosis present

## 2015-07-07 DIAGNOSIS — J387 Other diseases of larynx: Secondary | ICD-10-CM | POA: Diagnosis present

## 2015-07-07 DIAGNOSIS — J392 Other diseases of pharynx: Secondary | ICD-10-CM | POA: Diagnosis present

## 2015-07-07 DIAGNOSIS — Z8249 Family history of ischemic heart disease and other diseases of the circulatory system: Secondary | ICD-10-CM

## 2015-07-07 HISTORY — PX: DIRECT LARYNGOSCOPY: SHX5326

## 2015-07-07 HISTORY — PX: INCISION AND DRAINAGE ABSCESS: SHX5864

## 2015-07-07 LAB — CBC WITH DIFFERENTIAL/PLATELET
BASOS PCT: 0 %
Basophils Absolute: 0 10*3/uL (ref 0–0.1)
Eosinophils Absolute: 0 10*3/uL (ref 0–0.7)
Eosinophils Relative: 0 %
HCT: 42.8 % (ref 40.0–52.0)
Hemoglobin: 14.5 g/dL (ref 13.0–18.0)
LYMPHS ABS: 0.4 10*3/uL — AB (ref 1.0–3.6)
LYMPHS PCT: 1 %
MCH: 30.4 pg (ref 26.0–34.0)
MCHC: 33.8 g/dL (ref 32.0–36.0)
MCV: 89.8 fL (ref 80.0–100.0)
MONOS PCT: 7 %
Monocytes Absolute: 1.8 10*3/uL — ABNORMAL HIGH (ref 0.2–1.0)
NEUTROS ABS: 24.2 10*3/uL — AB (ref 1.4–6.5)
NEUTROS PCT: 92 %
Platelets: 160 10*3/uL (ref 150–440)
RBC: 4.76 MIL/uL (ref 4.40–5.90)
RDW: 13.3 % (ref 11.5–14.5)
WBC: 26.4 10*3/uL — ABNORMAL HIGH (ref 3.8–10.6)

## 2015-07-07 LAB — BLOOD GAS, ARTERIAL
ACID-BASE DEFICIT: 3.5 mmol/L — AB (ref 0.0–2.0)
Allens test (pass/fail): POSITIVE — AB
Bicarbonate: 22.1 mEq/L (ref 21.0–28.0)
FIO2: 0.5
LHR: 15 {breaths}/min
O2 SAT: 98.9 %
PCO2 ART: 41 mmHg (ref 32.0–48.0)
PEEP: 5 cmH2O
PH ART: 7.34 — AB (ref 7.350–7.450)
Patient temperature: 37
VT: 500 mL
pO2, Arterial: 133 mmHg — ABNORMAL HIGH (ref 83.0–108.0)

## 2015-07-07 LAB — BASIC METABOLIC PANEL
Anion gap: 7 (ref 5–15)
BUN: 12 mg/dL (ref 6–20)
CHLORIDE: 101 mmol/L (ref 101–111)
CO2: 24 mmol/L (ref 22–32)
CREATININE: 0.96 mg/dL (ref 0.61–1.24)
Calcium: 8.5 mg/dL — ABNORMAL LOW (ref 8.9–10.3)
GFR calc non Af Amer: >60 mL/min (ref >60)
Glucose, Bld: 164 mg/dL — ABNORMAL HIGH (ref 65–99)
Potassium: 3.5 mmol/L (ref 3.5–5.1)
Sodium: 132 mmol/L — ABNORMAL LOW (ref 135–145)

## 2015-07-07 LAB — GLUCOSE, CAPILLARY: GLUCOSE-CAPILLARY: 192 mg/dL — AB (ref 65–99)

## 2015-07-07 LAB — MRSA PCR SCREENING: MRSA BY PCR: NEGATIVE

## 2015-07-07 LAB — TRIGLYCERIDES: Triglycerides: 55 mg/dL (ref <150)

## 2015-07-07 LAB — MONONUCLEOSIS SCREEN: Mono Screen: NEGATIVE

## 2015-07-07 SURGERY — LARYNGOSCOPY, DIRECT
Wound class: Clean Contaminated

## 2015-07-07 MED ORDER — SODIUM CHLORIDE 0.9 % IV SOLN
3.0000 g | Freq: Four times a day (QID) | INTRAVENOUS | Status: DC
  Administered 2015-07-07 – 2015-07-10 (×11): 3 g via INTRAVENOUS
  Filled 2015-07-07 (×14): qty 3

## 2015-07-07 MED ORDER — INFLUENZA VAC SPLIT QUAD 0.5 ML IM SUSY: 0.5000 mL | PREFILLED_SYRINGE | INTRAMUSCULAR | Status: DC

## 2015-07-07 MED ORDER — ONDANSETRON HCL 4 MG/2ML IJ SOLN
INTRAMUSCULAR | Status: DC | PRN
  Administered 2015-07-07: 4 mg via INTRAVENOUS

## 2015-07-07 MED ORDER — FAMOTIDINE IN NACL 20-0.9 MG/50ML-% IV SOLN
20.0000 mg | INTRAVENOUS | Status: DC
  Administered 2015-07-07 – 2015-07-08 (×2): 20 mg via INTRAVENOUS
  Filled 2015-07-07 (×3): qty 50

## 2015-07-07 MED ORDER — ROCURONIUM BROMIDE 100 MG/10ML IV SOLN
INTRAVENOUS | Status: DC | PRN
  Administered 2015-07-07 (×2): 50 mg via INTRAVENOUS

## 2015-07-07 MED ORDER — VANCOMYCIN HCL IN DEXTROSE 1-5 GM/200ML-% IV SOLN
1000.0000 mg | Freq: Once | INTRAVENOUS | Status: AC
  Administered 2015-07-07: 1000 mg via INTRAVENOUS
  Filled 2015-07-07: qty 200

## 2015-07-07 MED ORDER — PNEUMOCOCCAL VAC POLYVALENT 25 MCG/0.5ML IJ INJ: 0.5000 mL | INJECTION | INTRAMUSCULAR | Status: DC

## 2015-07-07 MED ORDER — MORPHINE SULFATE (PF) 2 MG/ML IV SOLN
2.0000 mg | INTRAVENOUS | Status: DC | PRN
  Administered 2015-07-07: 2 mg via INTRAVENOUS
  Filled 2015-07-07: qty 1

## 2015-07-07 MED ORDER — IOHEXOL 300 MG/ML  SOLN
75.0000 mL | Freq: Once | INTRAMUSCULAR | Status: AC | PRN
  Administered 2015-07-07: 75 mL via INTRAVENOUS
  Filled 2015-07-07: qty 75

## 2015-07-07 MED ORDER — ENOXAPARIN SODIUM 40 MG/0.4ML ~~LOC~~ SOLN
40.0000 mg | Freq: Two times a day (BID) | SUBCUTANEOUS | Status: DC
  Administered 2015-07-07 – 2015-07-09 (×5): 40 mg via SUBCUTANEOUS
  Filled 2015-07-07 (×5): qty 0.4

## 2015-07-07 MED ORDER — PIPERACILLIN-TAZOBACTAM 3.375 G IVPB
3.3750 g | Freq: Once | INTRAVENOUS | Status: AC
  Administered 2015-07-07: 3.375 g via INTRAVENOUS
  Filled 2015-07-07: qty 50

## 2015-07-07 MED ORDER — FENTANYL CITRATE (PF) 100 MCG/2ML IJ SOLN
100.0000 ug | INTRAMUSCULAR | Status: DC | PRN
  Filled 2015-07-07: qty 2

## 2015-07-07 MED ORDER — ONDANSETRON HCL 4 MG/2ML IJ SOLN: 4.0000 mg | INTRAMUSCULAR | Status: DC | PRN

## 2015-07-07 MED ORDER — LIDOCAINE-EPINEPHRINE 1 %-1:100000 IJ SOLN
INTRAMUSCULAR | Status: DC | PRN
  Administered 2015-07-07: 6 mL

## 2015-07-07 MED ORDER — ONDANSETRON HCL 4 MG PO TABS: 4.0000 mg | ORAL_TABLET | ORAL | Status: DC | PRN

## 2015-07-07 MED ORDER — MIDAZOLAM HCL 2 MG/2ML IJ SOLN
2.0000 mg | INTRAMUSCULAR | Status: DC | PRN
  Administered 2015-07-07: 2 mg via INTRAVENOUS
  Filled 2015-07-07 (×2): qty 2

## 2015-07-07 MED ORDER — BACITRACIN ZINC 500 UNIT/GM EX OINT
1.0000 | TOPICAL_OINTMENT | Freq: Three times a day (TID) | CUTANEOUS | Status: DC
Start: 2015-07-07 — End: 2015-07-10
  Administered 2015-07-08 – 2015-07-10 (×6): 1 via TOPICAL
  Filled 2015-07-07: qty 28.35

## 2015-07-07 MED ORDER — DEXAMETHASONE SODIUM PHOSPHATE 10 MG/ML IJ SOLN
10.0000 mg | Freq: Once | INTRAMUSCULAR | Status: AC
  Administered 2015-07-07: 10 mg via INTRAVENOUS
  Filled 2015-07-07: qty 1

## 2015-07-07 MED ORDER — DEXAMETHASONE SODIUM PHOSPHATE 10 MG/ML IJ SOLN
INTRAMUSCULAR | Status: DC | PRN
  Administered 2015-07-07: 10 mg via INTRAVENOUS

## 2015-07-07 MED ORDER — GLYCOPYRROLATE 0.2 MG/ML IJ SOLN
INTRAMUSCULAR | Status: DC | PRN
  Administered 2015-07-07: 0.2 mg via INTRAVENOUS

## 2015-07-07 MED ORDER — MIDAZOLAM HCL 2 MG/2ML IJ SOLN
INTRAMUSCULAR | Status: DC | PRN
  Administered 2015-07-07: 1 mg via INTRAVENOUS

## 2015-07-07 MED ORDER — SODIUM CHLORIDE 0.9 % IV SOLN
3.0000 g | Freq: Once | INTRAVENOUS | Status: DC
  Filled 2015-07-07: qty 3

## 2015-07-07 MED ORDER — KCL IN DEXTROSE-NACL 20-5-0.45 MEQ/L-%-% IV SOLN
INTRAVENOUS | Status: DC
  Administered 2015-07-07 – 2015-07-08 (×2): via INTRAVENOUS
  Filled 2015-07-07 (×8): qty 1000

## 2015-07-07 MED ORDER — MIDAZOLAM HCL 2 MG/2ML IJ SOLN
2.0000 mg | INTRAMUSCULAR | Status: DC | PRN
  Administered 2015-07-07: 2 mg via INTRAVENOUS

## 2015-07-07 MED ORDER — ANTISEPTIC ORAL RINSE SOLUTION (CORINZ)
7.0000 mL | Freq: Four times a day (QID) | OROMUCOSAL | Status: DC
  Administered 2015-07-07 – 2015-07-09 (×8): 7 mL via OROMUCOSAL
  Filled 2015-07-07 (×11): qty 7

## 2015-07-07 MED ORDER — DEXAMETHASONE SODIUM PHOSPHATE 10 MG/ML IJ SOLN
10.0000 mg | Freq: Three times a day (TID) | INTRAMUSCULAR | Status: DC
  Administered 2015-07-07 – 2015-07-09 (×5): 10 mg via INTRAVENOUS
  Filled 2015-07-07 (×5): qty 1

## 2015-07-07 MED ORDER — CHLORHEXIDINE GLUCONATE 0.12% ORAL RINSE (MEDLINE KIT)
15.0000 mL | Freq: Two times a day (BID) | OROMUCOSAL | Status: DC
  Administered 2015-07-07 – 2015-07-09 (×4): 15 mL via OROMUCOSAL
  Filled 2015-07-07 (×6): qty 15

## 2015-07-07 MED ORDER — PROPOFOL 10 MG/ML IV BOLUS
INTRAVENOUS | Status: DC | PRN
  Administered 2015-07-07: 50 mg via INTRAVENOUS
  Administered 2015-07-07: 200 mg via INTRAVENOUS
  Administered 2015-07-07 (×2): 100 mg via INTRAVENOUS

## 2015-07-07 MED ORDER — LIDOCAINE HCL (CARDIAC) 20 MG/ML IV SOLN
INTRAVENOUS | Status: DC | PRN
  Administered 2015-07-07: 100 mg via INTRAVENOUS

## 2015-07-07 MED ORDER — PROPOFOL 1000 MG/100ML IV EMUL
0.0000 ug/kg/min | INTRAVENOUS | Status: DC
  Administered 2015-07-07: 60 ug/kg/min via INTRAVENOUS
  Administered 2015-07-07: 5 ug/kg/min via INTRAVENOUS
  Administered 2015-07-07: 25 ug/kg/min via INTRAVENOUS
  Administered 2015-07-08: 40 ug/kg/min via INTRAVENOUS
  Administered 2015-07-08: 50 ug/kg/min via INTRAVENOUS
  Administered 2015-07-08: 35 ug/kg/min via INTRAVENOUS
  Administered 2015-07-08 (×2): 45 ug/kg/min via INTRAVENOUS
  Administered 2015-07-08: 35 ug/kg/min via INTRAVENOUS
  Administered 2015-07-08: 50 ug/kg/min via INTRAVENOUS
  Administered 2015-07-08 (×2): 45 ug/kg/min via INTRAVENOUS
  Administered 2015-07-09 (×2): 35 ug/kg/min via INTRAVENOUS
  Administered 2015-07-09: 15 ug/kg/min via INTRAVENOUS
  Filled 2015-07-07 (×14): qty 100

## 2015-07-07 MED ORDER — FENTANYL CITRATE (PF) 100 MCG/2ML IJ SOLN
100.0000 ug | INTRAMUSCULAR | Status: DC | PRN
  Administered 2015-07-07 – 2015-07-08 (×2): 100 ug via INTRAVENOUS
  Filled 2015-07-07: qty 2

## 2015-07-07 MED ORDER — FENTANYL CITRATE (PF) 100 MCG/2ML IJ SOLN
INTRAMUSCULAR | Status: DC | PRN
  Administered 2015-07-07: 100 ug via INTRAVENOUS
  Administered 2015-07-07: 50 ug via INTRAVENOUS
  Administered 2015-07-07: 100 ug via INTRAVENOUS

## 2015-07-07 MED ORDER — SODIUM CHLORIDE 0.9 % IV BOLUS (SEPSIS)
1000.0000 mL | Freq: Once | INTRAVENOUS | Status: AC
  Administered 2015-07-07: 1000 mL via INTRAVENOUS

## 2015-07-07 MED ORDER — HYDROMORPHONE HCL 1 MG/ML IJ SOLN
1.0000 mg | Freq: Once | INTRAMUSCULAR | Status: AC
  Administered 2015-07-07: 1 mg via INTRAVENOUS
  Filled 2015-07-07: qty 1

## 2015-07-07 MED ORDER — PROPOFOL 1000 MG/100ML IV EMUL
INTRAVENOUS | Status: AC
Start: 2015-07-07 — End: 2015-07-07
  Administered 2015-07-07: 5 ug/kg/min via INTRAVENOUS
  Filled 2015-07-07: qty 100

## 2015-07-07 MED ORDER — SODIUM CHLORIDE 0.9 % IV SOLN
INTRAVENOUS | Status: DC | PRN
  Administered 2015-07-07 (×2): via INTRAVENOUS

## 2015-07-07 SURGICAL SUPPLY — 24 items
BLADE SURG 15 STRL LF DISP TIS (BLADE) ×2 IMPLANT
BLADE SURG 15 STRL SS (BLADE) ×1
BLADE SURG SZ11 CARB STEEL (BLADE) ×3 IMPLANT
CANISTER SUCT 1200ML W/VALVE (MISCELLANEOUS) ×3 IMPLANT
ELECT CAUTERY BLADE TIP 2.5 (TIP) ×3
ELECT REM PT RETURN 9FT ADLT (ELECTROSURGICAL) ×3
ELECTRODE CAUTERY BLDE TIP 2.5 (TIP) ×2 IMPLANT
ELECTRODE REM PT RTRN 9FT ADLT (ELECTROSURGICAL) ×2 IMPLANT
GAUZE IODOFORM PACK 1/2 7832 (GAUZE/BANDAGES/DRESSINGS) IMPLANT
GLOVE BIO SURGEON STRL SZ7.5 (GLOVE) ×12 IMPLANT
GOWN STRL REUS W/ TWL LRG LVL3 (GOWN DISPOSABLE) ×8 IMPLANT
GOWN STRL REUS W/TWL LRG LVL3 (GOWN DISPOSABLE) ×4
HARMONIC SCALPEL FOCUS (MISCELLANEOUS) ×3 IMPLANT
LABEL OR SOLS (LABEL) ×3 IMPLANT
NS IRRIG 500ML POUR BTL (IV SOLUTION) ×3 IMPLANT
PACK HEAD/NECK (MISCELLANEOUS) ×3 IMPLANT
SPONGE EXCIL AMD DRAIN 4X4 6P (MISCELLANEOUS) ×3 IMPLANT
SUCTION FRAZIER HANDLE 10FR (MISCELLANEOUS) ×1
SUCTION TUBE FRAZIER 10FR DISP (MISCELLANEOUS) ×2 IMPLANT
SUT ETHILON 2 0 FS 18 (SUTURE) ×12 IMPLANT
SUT SILK 2 0 (SUTURE) ×1
SUT SILK 2-0 18XBRD TIE 12 (SUTURE) ×2 IMPLANT
SUT VIC AB 3-0 PS2 18 (SUTURE) ×6 IMPLANT
SWAB DUAL CULTURE TRANS RED ST (MISCELLANEOUS) ×3 IMPLANT

## 2015-07-07 NOTE — Consult Note (Signed)
Ronald Montgomery, Ronald Montgomery [id number] 01/25/1981 Minna Antis, MD  Reason for Consult: Epiglottitis with airway obstruction  HPI: #4 day history of severe sore throat presented to the emergency room yesterday strep test which was negative was sent home on antihistamines and ibuprofen return today with worsening sore throat and difficulty swallowing. CT scan showed epiglottitis with significant epiglottic edema and gas within the epiglottis.  Allergies: No Known Allergies  ROS: Review of systems normal other than 12 systems except per HPI.  PMH:  Past Medical History  Diagnosis Date  . Alcohol abuse   . Chlamydia   . Asthma   . Thyroid disorder     during childhood  . Constipation   . Scabies   . Testicular/scrotal pain 02/03/2015    Normal exam and [place] x several.    . Penile pain 02/03/2015    Normal exams x several     FH:  Family History  Problem Relation Age of Onset  . Thyroid cancer Maternal Grandfather   . Heart attack Maternal Grandfather   . Diabetes Maternal Grandfather   . Skin cancer Mother   . Diabetes Mother     SH:  Social History   Social History  . Marital Status: Married    Spouse Name: N/A  . Number of Children: N/A  . Years of Education: N/A   Occupational History  . Not on file.   Social History Main Topics  . Smoking status: Current Every Day Smoker -- 0.50 packs/day    Types: Cigarettes  . Smokeless tobacco: Not on file  . Alcohol Use: 0.0 oz/week    0 Standard drinks or equivalent per week     Comment: ocasional  . Drug Use: Yes    Special: Marijuana     Comment: occasional  . Sexual Activity: Yes    Birth Control/ Protection: None   Other Topics Concern  . Not on file   Social History Narrative    PSH:  Past Surgical History  Procedure Laterality Date  . Abcess drainage      groin area  . Rhytidectomy neck / cheek / chin      due to accident  . Cyst excision      top of head    Physical  Exam: Otherwise healthy appearing male  unable to handle his own secretions mild hoarseness CN 2-12 grossly intact and symmetric. EAC/TMs normal BL. Oral cavity, lips, gums, ororpharynx normal with no masses or lesions. Skin warm and dry. Nasal cavity without polyps or purulence. External nose and ears without masses or lesions. EOMI, PERRLA. Neck supple with no masses or lesions. No lymphadenopathy palpated. Thyroid normal with no masses.  Procedure: Flexible fiberoptic laryngoscopy was performed at the bedside after decongestion with Afrin examination of the larynx showed significant edema and swelling of the epiglottis obscuring the complete view of the glottis the arytenoids could be visualized and appeared to be functioning but there was moderate edema there  Review of CT scan confirms glottitis with likely epiglottic abscess.   A/P: Epiglottitis with impending airway obstruction-we'll take to the emergency room for urgent intubation and possible tracheostomy scheduled patient risk and benefits including bleeding infection airway obstruction and death and we will proceed.  35 minutes were used to assess the patient in a critical situation.   Axle Parfait T 07/07/2015 2:55 PM

## 2015-07-07 NOTE — ED Notes (Addendum)
Pt reports he has a sore throat.  Pt was seen here earlier today and was dx with pharyngitis.  Pt states i'm not any better and it hurts on the left side of my throat.  No acute resp distress.

## 2015-07-07 NOTE — Progress Notes (Signed)
ANTICOAGULATION CONSULT NOTE - Initial Consult  Pharmacy Consult for Lovenox Indication: VTE prophylaxis  No Known Allergies  Patient Measurements: Height:  (167.6 cm) Weight: 270 lb (122.471 kg) IBW/kg (Calculated) : 63.8  Vital Signs: Temp: 100 F (37.8 C) (02/07 1513) Temp Source: Axillary (02/07 1513) BP: 97/56 mmHg (02/07 1513) Pulse Rate: 98 (02/07 1513)  Labs:  Recent Labs  07/07/15 0851  HGB 14.5  HCT 42.8  PLT 160  CREATININE 0.96    Estimated Creatinine Clearance: 131.4 mL/min (by C-G formula based on Cr of 0.96).   Medical History: Past Medical History  Diagnosis Date  . Alcohol abuse   . Chlamydia   . Asthma   . Thyroid disorder     during childhood  . Constipation   . Scabies   . Testicular/scrotal pain 02/03/2015    Normal exam and [place] x several.    . Penile pain 02/03/2015    Normal exams x several     Medications:  Scheduled:  . ampicillin-sulbactam (UNASYN) IV  3 g Intravenous Q6H  . antiseptic oral rinse  7 mL Mouth Rinse QID  . bacitracin  1 application Topical 3 times per day  . chlorhexidine gluconate  15 mL Mouth Rinse BID  . dexamethasone  10 mg Intravenous 3 times per day  . enoxaparin (LOVENOX) injection  40 mg Subcutaneous Q12H  . famotidine (PEPCID) IV  20 mg Intravenous Q24H  . piperacillin-tazobactam (ZOSYN)  IV  3.375 g Intravenous Once   Infusions:  . dextrose 5 % and 0.45 % NaCl with KCl 20 mEq/L    . propofol (DIPRIVAN) infusion 5 mcg/kg/min (07/07/15 1527)    Assessment: 35 y/o M with epiglottitis s/p emergency trach ordrered Lovenox for DVT prophylaxis.   Goal of Therapy:  Monitor platelets by anticoagulation protocol: Yes   Plan:  Due to weight > 100 kg and BMI > 40, will begin Lovenox 40 mg bid.   Luisa Hart D 07/07/2015,3:39 PM

## 2015-07-07 NOTE — Progress Notes (Signed)
eLink Physician-Brief Progress Note Patient Name: Ronald Montgomery DOB: 03-04-1981 MRN: [id number]   Date of Service  07/07/2015  HPI/Events of Note  35 yo male admitted for acute airway obstruction from acute epiglottic abscess s/p I&D by ENT  eICU Interventions  Vent orders, sedation protocol On iv abx, IV steroids.        Erin Fulling 07/07/2015, 3:18 PM

## 2015-07-07 NOTE — ED Provider Notes (Signed)
I have assumed care of the patient. According to CT report patient has epiglottitis with possible gas along the epiglottis. I have called ENT to evaluate the patient in the ER. I ordered like a Vancomycin and Zosyn for antibiotic coverage. He received Decadron prior to my evaluation.  Emily Filbert, MD 07/07/15 1320

## 2015-07-07 NOTE — ED Provider Notes (Cosign Needed)
[facility] Emergency Department Provider Note  ____________________________________________  Time seen: Approximately 8:12 AM  I have reviewed the triage vital signs and the nursing notes.   HISTORY  Chief Complaint Sore Throat   HPI Ronald Montgomery is a 35 y.o. male is here complaining of inability to swallow secondary to pain. Patient was seen in the emergency room yesterday and diagnosed with viral pharyngitis. Patient was given a prescription for albuterol inhaler at that time along with Tessalon Perles for coughing. Patient states that the back of his throat  feels raw and he doesn't feel any better.patient is unaware of any fever at home. He states that he has spit up some "black stuff". He states the medication that he was given yesterday has not helped. Currently he rates his pain as 10 out of 10.   Past Medical History  Diagnosis Date  . Alcohol abuse   . Chlamydia   . Asthma   . Thyroid disorder     during childhood  . Constipation   . Scabies   . Testicular/scrotal pain 02/03/2015    Normal exam and [place] x several.    . Penile pain 02/03/2015    Normal exams x several     Patient Active Problem List   Diagnosis Date Noted  . Penile pain 02/03/2015  . Testicular/scrotal pain 02/03/2015    Past Surgical History  Procedure Laterality Date  . Abcess drainage      groin area  . Rhytidectomy neck / cheek / chin      due to accident  . Cyst excision      top of head    Current Outpatient Rx  Name  Route  Sig  Dispense  Refill  . albuterol (PROVENTIL HFA;VENTOLIN HFA) 108 (90 Base) MCG/ACT inhaler   Inhalation   Inhale 2 puffs into the lungs every 6 (six) hours as needed for wheezing or shortness of breath.   1 Inhaler   2   . benzonatate (TESSALON PERLES) 100 MG capsule   Oral   Take 2 capsules (200 mg total) by mouth 3 (three) times daily as needed for cough.   30 capsule   0   . brompheniramine-pseudoephedrine-DM 30-2-10 MG/5ML  syrup   Oral   Take 5 mLs by mouth 4 (four) times daily as needed. Mixed with viscous lidocaine for swish and swallow.   120 mL   0   . diclofenac (VOLTAREN) 75 MG EC tablet   Oral   Take 1 tablet (75 mg total) by mouth 2 (two) times daily.   20 tablet   0   . doxycycline (VIBRAMYCIN) 100 MG capsule   Oral   Take 1 capsule (100 mg total) by mouth 2 (two) times daily.   28 capsule   8   . ibuprofen (ADVIL,MOTRIN) 800 MG tablet   Oral   Take 1 tablet (800 mg total) by mouth every 8 (eight) hours as needed.   30 tablet   0   . lidocaine (XYLOCAINE) 2 % solution   Mouth/Throat   Use as directed 5 mLs in the mouth or throat every 6 (six) hours as needed for mouth pain. Mixed with 5 mL of Bromfed-DM for swish and swallow.   100 mL   0   . nystatin-triamcinolone ointment (MYCOLOG)   Topical   Apply 1 application topically 2 (two) times daily. Patient not taking: Reported on 02/03/2015   30 g   0   . traMADol (ULTRAM)  50 MG tablet   Oral   Take 1 tablet (50 mg total) by mouth every 6 (six) hours as needed for moderate pain. Patient not taking: Reported on 02/03/2015   12 tablet   0   . triamcinolone ointment (KENALOG) 0.5 %   Topical   Apply 1 application topically 2 (two) times daily.   30 g   0     Allergies Review of patient's allergies indicates no known allergies.  Family History  Problem Relation Age of Onset  . Thyroid cancer Maternal Grandfather   . Heart attack Maternal Grandfather   . Diabetes Maternal Grandfather   . Skin cancer Mother   . Diabetes Mother     Social History Social History  Substance Use Topics  . Smoking status: Current Every Day Smoker -- 0.50 packs/day    Types: Cigarettes  . Smokeless tobacco: None  . Alcohol Use: 0.0 oz/week    0 Standard drinks or equivalent per week     Comment: ocasional    Review of Systems Constitutional: No fever/chills Eyes: No visual changes. ENT: Positive sore throat. Cardiovascular: Denies  chest pain. Respiratory: Denies shortness of breath. Occasional cough Gastrointestinal: No abdominal pain.  No nausea, no vomiting.   Genitourinary: Negative for dysuria. Musculoskeletal: Negative for back pain. Skin: Negative for rash. Neurological: Negative for headaches, focal weakness or numbness.  10-point ROS otherwise negative.  ____________________________________________   PHYSICAL EXAM:  VITAL SIGNS: ED Triage Vitals  Enc Vitals Group     BP 07/07/15 0805 131/83 mmHg     Pulse Rate 07/07/15 0805 112     Resp 07/07/15 0805 20     Temp 07/07/15 0805 99.7 F (37.6 C)     Temp Source 07/07/15 0805 Oral     SpO2 07/07/15 0805 98 %     Weight 07/07/15 0805 270 lb (122.471 kg)     Height 07/07/15 0805 5\' 6"  (1.676 m)     Head Cir --      Peak Flow --      Pain Score 07/07/15 0806 10     Pain Loc --      Pain Edu? --      Excl. in GC? --     Constitutional: Alert and oriented. Well appearing and in no acute distress. Patient is talking in complete sentences and noted to swallow saliva without any difficulty. Eyes: Conjunctivae are normal. PERRL. EOMI. Head: Atraumatic. Nose: No congestion/rhinnorhea. Mouth/Throat: Mucous membranes are moist.  Oropharynx non-erythematous. No tonsillar exudate was seen. No edema or airway obstruction is identified. Patient is swallowing saliva without any difficulty. Neck: No stridor.  No gross deformity is noted. Hematological/Lymphatic/Immunilogical: Tender bilateral cervical lymphadenopathy. Cardiovascular: Normal rate, regular rhythm. Grossly normal heart sounds.  Good peripheral circulation. Respiratory: Normal respiratory effort.  No retractions. Lungs CTAB. Gastrointestinal: Soft and nontender. No distention.  Musculoskeletal: Moves upper and lower extremities without any difficulty and patient is ambulatory in the room. Neurologic:  Normal speech and language. No gross focal neurologic deficits are appreciated. No gait  instability. Skin:  Skin is warm, dry and intact. No rash noted. Psychiatric: Mood and affect are normal. Speech and behavior are normal.  ____________________________________________   LABS (all labs ordered are listed, but only abnormal results are displayed)  Labs Reviewed  CBC WITH DIFFERENTIAL/PLATELET - Abnormal; Notable for the following:    WBC 26.4 (*)    Neutro Abs 24.2 (*)    Lymphs Abs 0.4 (*)    Monocytes Absolute  1.8 (*)    All other components within normal limits  BASIC METABOLIC PANEL - Abnormal; Notable for the following:    Sodium 132 (*)    Glucose, Bld 164 (*)    Calcium 8.5 (*)    All other components within normal limits  MONONUCLEOSIS SCREEN     RADIOLOGY  CT soft tissue neck per radiologist shows thickened epiglottis and prominent gas in the epiglottis extending into the left tonsil concerning for acute epiglottitis from a gas forming organisms. I, Tommi Rumps, personally discussed these images and results by phone with the on-call radiologist and used this discussion as part of my medical decision making.  ____________________________________________   PROCEDURES  Procedure(s) performed: None  Critical Care performed: No  ____________________________________________   INITIAL IMPRESSION / ASSESSMENT AND PLAN / ED COURSE  Pertinent labs & imaging results that were available during my care of the patient were reviewed by me and considered in my medical decision making (see chart for details).  After discussing CT findings with radiologist, Dr. Mayford Knife was notified as he had the next available room in major.  Patient was transferred to major side of the emergency department and started on IV antibiotics. ____________________________________________   FINAL CLINICAL IMPRESSION(S) / ED DIAGNOSES  Final diagnoses:  Epiglottitis      Tommi Rumps, PA-C 07/07/15 1329

## 2015-07-07 NOTE — Progress Notes (Signed)
07/07/2015 6:06 PM  Ronald Montgomery [id number]  Post-Op Day 0    Temp:  [99.1 F (37.3 C)-100 F (37.8 C)] 100 F (37.8 C) (02/07 1513) Pulse Rate:  [85-113] 85 (02/07 1700) Resp:  [17-24] 24 (02/07 1513) BP: (97-145)/(56-83) 113/68 mmHg (02/07 1700) SpO2:  [95 %-100 %] 98 % (02/07 1700) FiO2 (%):  [50 %] 50 % (02/07 1522) Weight:  [122.471 kg (270 lb)] 122.471 kg (270 lb) (02/07 0805),     Intake/Output Summary (Last 24 hours) at 07/07/15 1806 Last data filed at 07/07/15 1800  Gross per 24 hour  Intake 2036.93 ml  Output    600 ml  Net 1436.93 ml    Results for orders placed or performed during the hospital encounter of 07/07/15 (from the past 24 hour(s))  CBC with Differential     Status: Abnormal   Collection Time: 07/07/15  8:51 AM  Result Value Ref Range   WBC 26.4 (H) 3.8 - 10.6 K/uL   RBC 4.76 4.40 - 5.90 MIL/uL   Hemoglobin 14.5 13.0 - 18.0 g/dL   HCT 84.6 96.2 - 95.2 %   MCV 89.8 80.0 - 100.0 fL   MCH 30.4 26.0 - 34.0 pg   MCHC 33.8 32.0 - 36.0 g/dL   RDW 84.1 32.4 - 40.1 %   Platelets 160 150 - 440 K/uL   Neutrophils Relative % 92 %   Neutro Abs 24.2 (H) 1.4 - 6.5 K/uL   Lymphocytes Relative 1 %   Lymphs Abs 0.4 (L) 1.0 - 3.6 K/uL   Monocytes Relative 7 %   Monocytes Absolute 1.8 (H) 0.2 - 1.0 K/uL   Eosinophils Relative 0 %   Eosinophils Absolute 0.0 0 - 0.7 K/uL   Basophils Relative 0 %   Basophils Absolute 0.0 0 - 0.1 K/uL  Basic metabolic panel     Status: Abnormal   Collection Time: 07/07/15  8:51 AM  Result Value Ref Range   Sodium 132 (L) 135 - 145 mmol/L   Potassium 3.5 3.5 - 5.1 mmol/L   Chloride 101 101 - 111 mmol/L   CO2 24 22 - 32 mmol/L   Glucose, Bld 164 (H) 65 - 99 mg/dL   BUN 12 6 - 20 mg/dL   Creatinine, Ser 0.27 0.61 - 1.24 mg/dL   Calcium 8.5 (L) 8.9 - 10.3 mg/dL   GFR calc non Af Amer >60 >60 mL/min   GFR calc Af Amer >60 >60 mL/min   Anion gap 7 5 - 15  Mononucleosis screen     Status: None   Collection Time: 07/07/15  8:51  AM  Result Value Ref Range   Mono Screen NEGATIVE NEGATIVE  Glucose, capillary     Status: Abnormal   Collection Time: 07/07/15  3:11 PM  Result Value Ref Range   Glucose-Capillary 192 (H) 65 - 99 mg/dL  Triglycerides     Status: None   Collection Time: 07/07/15  3:33 PM  Result Value Ref Range   Triglycerides 55 <150 mg/dL    SUBJECTIVE:  Sedated/intubated  OBJECTIVE:  stable  IMPRESSION:  epiglotitis with abscess-drained/cultures pending.    PLAN:  Remain intubated.  Will order CT on Thursday if clinically stable, if improved may extubate Thursday PM or Friday.    Riannon Mukherjee T 07/07/2015, 6:06 PM

## 2015-07-07 NOTE — Anesthesia Preprocedure Evaluation (Addendum)
Anesthesia Evaluation  Patient identified by MRN, date of birth, ID band Patient awake    Reviewed: Allergy & Precautions, NPO status , Patient's Chart, lab work & pertinent test results  History of Anesthesia Complications Negative for: history of anesthetic complications  Airway Mallampati: IV     Mouth opening: Limited Mouth Opening  Dental   Pulmonary asthma , Current Smoker,           Cardiovascular negative cardio ROS       Neuro/Psych negative neurological ROS     GI/Hepatic negative GI ROS, Neg liver ROS,   Endo/Other  negative endocrine ROS  Renal/GU negative Renal ROS     Musculoskeletal   Abdominal   Peds  Hematology negative hematology ROS (+)   Anesthesia Other Findings   Reproductive/Obstetrics                            Anesthesia Physical Anesthesia Plan  ASA: II and emergent  Anesthesia Plan: General   Post-op Pain Management:    Induction: Intravenous and Rapid sequence  Airway Management Planned: Oral ETT, Video Laryngoscope Planned and Tracheostomy  Additional Equipment:   Intra-op Plan:   Post-operative Plan:   Informed Consent: I have reviewed the patients History and Physical, chart, labs and discussed the procedure including the risks, benefits and alternatives for the proposed anesthesia with the patient or authorized representative who has indicated his/her understanding and acceptance.     Plan Discussed with:   Anesthesia Plan Comments:         Anesthesia Quick Evaluation

## 2015-07-07 NOTE — Progress Notes (Signed)
Pt family requesting possible transfer to another facility. Will share with day shift nurse, so that she can relay message to rounding physician.

## 2015-07-07 NOTE — Op Note (Signed)
07/07/2015  2:50 PM    Vannie, Hochstetler  [id number]   Pre-Op Dx: Epiglottitis with airway obstruction  Post-op Dx: SAME  Proc: Emergent intubation direct laryngoscopy with incision and drainage of epiglottic abscess   Surg:  Linus Salmons T   Assistant: Vaught  Anes:  GOT  EBL:  Less than 10 cc  Comp:  None  Findings:  Obvious abscess involving the epiglottis was significant edema of the arytenoid and pharyngeal edema  Procedure: Tyrann was taken emergently from the emergency room to the operating room. In emergent fashion the patient was lightly anesthetized the glide scope was used to try to intubate but this was unsuccessful. The standard laryngoscope was then introduced into the airway by me the tongue base was elevated anteriorly a bougie was then placed in the interarytenoid area into the trachea #7 endotracheal tube was advanced outward over the bougie. This established the airway this was taped in position at 24 cm. This completed the patient was completely anesthetized and sedated a tooth guard was then placed a Dedo laryngoscope was introduced into the airway and suspended was an obvious abscess of the epiglottis which was documented using the 0 Hopkins rod. A's laryngeal sickle knife was then used to open the top of the abscess immediately approximate 5 cc of pus was expressed this was sent for culture. The [place] Shea laryngeal instruments were brought in the field and using the [place]a scissors this epiglottic abscess was opened widely. The area was then thoroughly suctioned free. Escape significant decompression of the abscess. With the airway secured in the abscess drained the patient was return anesthesia where he will be transported to the intensive care unit for monitoring menstruation of IV antibiotics and steroids  Dispo:   Critical  Plan:  We will admit in the ICU ventilatory support antibiotics and steroids awaiting culture results plan repeat CT scan in 24-48  hrs. to assess the epiglottic swelling.  Saba Neuman T  07/07/2015 2:50 PM

## 2015-07-07 NOTE — ED Notes (Signed)
Pt was seen here last night and diagnosed with pharyngitis and reports continuing to have sore throat.

## 2015-07-07 NOTE — Progress Notes (Signed)
Patient had 340.00$ cash, a bank card and wallet in his possession on admission. Valuables placed in a patient valuable envelope with Mellissa Kohut, RN charge nurse as a witness to the amount of money, credit card and wallet and sent to security and was locked up by them.

## 2015-07-07 NOTE — Progress Notes (Signed)
Patient accepted from OR to ICU bed 10.  15:10  Dr. Sung Amabile called about intensivist consult and verbal order obtained for propofol for sedation while on vent.

## 2015-07-07 NOTE — Transfer of Care (Signed)
Immediate Anesthesia Transfer of Care Note  Patient: Ronald Montgomery  Procedure(s) Performed: Procedure(s): TRACHEOSTOMY (N/A)  Patient Location: PACU  Anesthesia Type:General  Level of Consciousness: sedated  Airway & Oxygen Therapy: Patient Spontanous Breathing and Patient connected to face mask oxygen  Post-op Assessment: Report given to RN and Post -op Vital signs reviewed and stable  Post vital signs: Reviewed and stable  Last Vitals:  Filed Vitals:   07/07/15 1400 07/07/15 1513  BP:  97/56  Pulse:  98  Temp:  37.8 C  Resp: 20 24    Complications: No apparent anesthesia complications

## 2015-07-07 NOTE — Progress Notes (Signed)
Patient awake and has disconnected the ETT from the ventilator. Propofol dose increased to 50 and PRN versed and morphine given. Patient's wife and mother at bedside.

## 2015-07-07 NOTE — Anesthesia Postprocedure Evaluation (Signed)
Anesthesia Post Note  Patient: Ronald Montgomery  Procedure(s) Performed: Procedure(s) (LRB): DIRECT LARYNGOSCOPY INCISION AND DRAINAGE ABSCESS (N/A)  Patient location during evaluation: ICU Anesthesia Type: General Level of consciousness: obtunded/minimal responses and patient remains intubated per anesthesia plan Pain management: pain level controlled Vital Signs Assessment: post-procedure vital signs reviewed and stable Respiratory status: patient remains intubated per anesthesia plan Cardiovascular status: stable Anesthetic complications: no    Last Vitals:  Filed Vitals:   07/07/15 1400 07/07/15 1513  BP:  97/56  Pulse:  98  Temp:  37.8 C  Resp: 20 24    Last Pain:  Filed Vitals:   07/07/15 1514  PainSc: 10-Worst pain ever                 KEPHART,WILLIAM K

## 2015-07-07 NOTE — ED Notes (Signed)
Patient returns complaining of inability to swallow.  Seen here yesterday.  States he is throwing up blood brown and black stuff.  Speech is clear.  Back of throat is reddened.  Coughing and spitting up.  No drooling noted.

## 2015-07-07 NOTE — Anesthesia Procedure Notes (Signed)
Procedure Name: Intubation Date/Time: 07/07/2015 2:29 PM Performed by: ,  Pre-anesthesia Checklist: Patient identified, Patient being monitored, Timeout performed, Emergency Drugs available and Suction available Patient Re-evaluated:Patient Re-evaluated prior to inductionOxygen Delivery Method: Circle system utilized Preoxygenation: Pre-oxygenation with 100% oxygen Intubation Type: IV induction Ventilation: Mask ventilation without difficulty Laryngoscope Size: Mac, 3, Glidescope and 4 Grade View: Grade I Tube type: Oral Tube size: 7.0 mm Number of attempts: 5 or more Airway Equipment and Method: Stylet,  Bougie stylet,  Video-laryngoscopy,  LTA kit utilized and Patient positioned with wedge pillow Placement Confirmation: ETT inserted through vocal cords under direct vision,  positive ETCO2 and breath sounds checked- equal and bilateral Secured at: 21 cm Tube secured with: Tape Dental Injury: Teeth and Oropharynx as per pre-operative assessment  Difficulty Due To: Difficulty was anticipated and Difficult Airway-  due to edematous airway Comments: Intubation attempted with glidescope x3 with MAC 3 and  MAC 4 per anesthesia team. Dr. McQueen used rigid laryngoscope x2 without success. Mac 3 utilized to intubate patient per Dr. McQueen successfully.     

## 2015-07-07 NOTE — Progress Notes (Signed)
Pt belongings were re-inventoried to be more specific about what actually on pt upon arrival. Pt wife was complaining about not having her ID and bank card in order to feed their kids. Turns out, wife found her ID and I agreed to return only her bank card (has her name on it). Security returned pt belongings to safe, key is in pt chart. Will wait until pt can make conscious decision regarding belongings.

## 2015-07-08 ENCOUNTER — Inpatient Hospital Stay: Payer: Self-pay

## 2015-07-08 ENCOUNTER — Encounter: Payer: Self-pay | Admitting: Unknown Physician Specialty

## 2015-07-08 DIAGNOSIS — R451 Restlessness and agitation: Secondary | ICD-10-CM

## 2015-07-08 DIAGNOSIS — J0511 Acute epiglottitis with obstruction: Principal | ICD-10-CM

## 2015-07-08 DIAGNOSIS — Z9911 Dependence on respirator [ventilator] status: Secondary | ICD-10-CM

## 2015-07-08 DIAGNOSIS — R739 Hyperglycemia, unspecified: Secondary | ICD-10-CM

## 2015-07-08 LAB — GLUCOSE, CAPILLARY
GLUCOSE-CAPILLARY: 167 mg/dL — AB (ref 65–99)
GLUCOSE-CAPILLARY: 146 mg/dL — AB (ref 65–99)
Glucose-Capillary: 167 mg/dL — ABNORMAL HIGH (ref 65–99)

## 2015-07-08 MED ORDER — PNEUMOCOCCAL VAC POLYVALENT 25 MCG/0.5ML IJ INJ: 0.5000 mL | INJECTION | INTRAMUSCULAR | Status: DC | PRN

## 2015-07-08 MED ORDER — FENTANYL CITRATE (PF) 100 MCG/2ML IJ SOLN
INTRAMUSCULAR | Status: AC
  Administered 2015-07-08: 50 ug via INTRAVENOUS
  Filled 2015-07-08: qty 2

## 2015-07-08 MED ORDER — FENTANYL CITRATE (PF) 100 MCG/2ML IJ SOLN
50.0000 ug | Freq: Once | INTRAMUSCULAR | Status: AC
  Administered 2015-07-08: 50 ug via INTRAVENOUS

## 2015-07-08 MED ORDER — POTASSIUM CHLORIDE 2 MEQ/ML IV SOLN
INTRAVENOUS | Status: DC
  Administered 2015-07-08 – 2015-07-09 (×3): via INTRAVENOUS
  Filled 2015-07-08 (×4): qty 1000

## 2015-07-08 MED ORDER — IOHEXOL 240 MG/ML SOLN: 25.0000 mL | Freq: Once | INTRAMUSCULAR | Status: DC | PRN

## 2015-07-08 MED ORDER — FENTANYL BOLUS VIA INFUSION
50.0000 ug | INTRAVENOUS | Status: DC | PRN
  Filled 2015-07-08: qty 50

## 2015-07-08 MED ORDER — SODIUM CHLORIDE 0.9 % IV SOLN
2.5000 mL/h | INTRAVENOUS | Status: DC
  Administered 2015-07-08: 3 mL/h via INTRAVENOUS
  Administered 2015-07-08: 4 mL/h via INTRAVENOUS
  Administered 2015-07-09: 15 mL/h via INTRAVENOUS
  Filled 2015-07-08 (×8): qty 200

## 2015-07-08 MED ORDER — INSULIN ASPART 100 UNIT/ML ~~LOC~~ SOLN
0.0000 [IU] | SUBCUTANEOUS | Status: DC
  Administered 2015-07-08: 3 [IU] via SUBCUTANEOUS
  Administered 2015-07-08: 2 [IU] via SUBCUTANEOUS
  Administered 2015-07-08 – 2015-07-09 (×2): 3 [IU] via SUBCUTANEOUS
  Administered 2015-07-09: 2 [IU] via SUBCUTANEOUS
  Administered 2015-07-09 (×2): 3 [IU] via SUBCUTANEOUS
  Administered 2015-07-10: 2 [IU] via SUBCUTANEOUS
  Filled 2015-07-08 (×2): qty 3
  Filled 2015-07-08: qty 2
  Filled 2015-07-08 (×2): qty 3
  Filled 2015-07-08: qty 2
  Filled 2015-07-08 (×2): qty 3

## 2015-07-08 MED ORDER — SODIUM CHLORIDE 0.9 % IV SOLN
2.5000 mL/h | INTRAVENOUS | Status: DC
  Filled 2015-07-08 (×3): qty 250

## 2015-07-08 MED ORDER — FENTANYL 2500MCG IN NS 250ML (10MCG/ML) PREMIX INFUSION: 25.0000 ug/h | INTRAVENOUS | Status: DC

## 2015-07-08 MED ORDER — INFLUENZA VAC SPLIT QUAD 0.5 ML IM SUSY: 0.5000 mL | PREFILLED_SYRINGE | INTRAMUSCULAR | Status: DC | PRN

## 2015-07-08 NOTE — Consult Note (Signed)
PULMONARY / CRITICAL CARE MEDICINE   Name: YOSEF KROGH MRN: [id number] DOB: [birth year]    ADMISSION DATE:  07/07/2015 CONSULTATION DATE:  02/08  REFERRING MD:  Jenne Campus   HISTORY OF PRESENT ILLNESS:   Presented to ED on 3 separate occasions with ST and last time with stridorous respirations and respiratory distress. Required intubation. Went to OR where an epiglottal abscess was drained. Remained intubated post op and PCCM asked to manage vent and related issues  PAST MEDICAL HISTORY :  He  has a past medical history of Alcohol abuse; Chlamydia; Asthma; Thyroid disorder; Constipation; Scabies; Testicular/scrotal pain (02/03/2015); and Penile pain (02/03/2015).  PAST SURGICAL HISTORY: He  has past surgical history that includes Abscess drainage; Rhytidectomy neck / cheek / chin; Cyst excision; Direct laryngoscopy (07/07/2015); and Incision and drainage abscess (N/A, 07/07/2015).  No Known Allergies  No current facility-administered medications on file prior to encounter.   Current Outpatient Prescriptions on File Prior to Encounter  Medication Sig  . albuterol (PROVENTIL HFA;VENTOLIN HFA) 108 (90 Base) MCG/ACT inhaler Inhale 2 puffs into the lungs every 6 (six) hours as needed for wheezing or shortness of breath.  . benzonatate (TESSALON PERLES) 100 MG capsule Take 2 capsules (200 mg total) by mouth 3 (three) times daily as needed for cough.  . brompheniramine-pseudoephedrine-DM 30-2-10 MG/5ML syrup Take 5 mLs by mouth 4 (four) times daily as needed. Mixed with viscous lidocaine for swish and swallow.  . diclofenac (VOLTAREN) 75 MG EC tablet Take 1 tablet (75 mg total) by mouth 2 (two) times daily.  Marland Kitchen doxycycline (VIBRAMYCIN) 100 MG capsule Take 1 capsule (100 mg total) by mouth 2 (two) times daily.  Marland Kitchen ibuprofen (ADVIL,MOTRIN) 800 MG tablet Take 1 tablet (800 mg total) by mouth every 8 (eight) hours as needed.  . lidocaine (XYLOCAINE) 2 % solution Use as directed 5 mLs in the mouth or throat  every 6 (six) hours as needed for mouth pain. Mixed with 5 mL of Bromfed-DM for swish and swallow.  . nystatin-triamcinolone ointment (MYCOLOG) Apply 1 application topically 2 (two) times daily. (Patient not taking: Reported on 02/03/2015)  . traMADol (ULTRAM) 50 MG tablet Take 1 tablet (50 mg total) by mouth every 6 (six) hours as needed for moderate pain. (Patient not taking: Reported on 02/03/2015)  . triamcinolone ointment (KENALOG) 0.5 % Apply 1 application topically 2 (two) times daily.    FAMILY HISTORY:  His indicated that his mother is alive. He indicated that his father is alive.   SOCIAL HISTORY: He  reports that he has been smoking Cigarettes.  He has been smoking about 0.50 packs per day. He does not have any smokeless tobacco history on file. He reports that he drinks alcohol. He reports that he uses illicit drugs (Marijuana).  REVIEW OF SYSTEMS:   Unable to obtain  SUBJECTIVE:    VITAL SIGNS: BP 120/101 mmHg  Pulse 85  Temp(Src) 97.8 F (36.6 C) (Axillary)  Resp 19  Ht  (1.676 m)  Wt 122.471 kg (270 lb)  BMI 43.60 kg/m2  SpO2 95%  HEMODYNAMICS:    VENTILATOR SETTINGS: Vent Mode:  [-] PSV;CPAP FiO2 (%):  [40 %-50 %] 40 % Set Rate:  [15 bmp] 15 bmp Vt Set:  [500 mL] 500 mL PEEP:  [5 cmH20] 5 cmH20 Pressure Support:  [10 cmH20] 10 cmH20  INTAKE / OUTPUT: I/O last 3 completed shifts: In: 4154.7 [I.V.:3804.7; IV Piggyback:350] Out: 4000 [Urine:4000]  PHYSICAL EXAMINATION: General: WDWN, RASS -3. Agitated on WUA  Neuro: MAEs, no focal deficits, CNs intact HEENT: NCAT, WNL Cardiovascular: reg, no M Lungs: diffuse scattered rhonchi, bloody ETT secretions Abdomen: soft, NT, + BS Ext: warm, no edema  LABS:  BMET  Recent Labs Lab 07/07/15 0851  NA 132*  K 3.5  CL 101  CO2 24  BUN 12  CREATININE 0.96  GLUCOSE 164*    Electrolytes  Recent Labs Lab 07/07/15 0851  CALCIUM 8.5*    CBC  Recent Labs Lab 07/07/15 0851  WBC 26.4*  HGB  14.5  HCT 42.8  PLT 160    Coag's No results for input(s): APTT, INR in the last 168 hours.  Sepsis Markers No results for input(s): LATICACIDVEN, PROCALCITON, O2SATVEN in the last 168 hours.  ABG  Recent Labs Lab 07/07/15 1840  PHART 7.34*  PCO2ART 41  PO2ART 133*    Liver Enzymes No results for input(s): AST, ALT, ALKPHOS, BILITOT, ALBUMIN in the last 168 hours.  Cardiac Enzymes No results for input(s): TROPONINI, PROBNP in the last 168 hours.  Glucose  Recent Labs Lab 07/07/15 1511 07/08/15 1131  GLUCAP 192* 167*    CXR: LLV, B atx    STUDIES:  02/07 CT neck: Thickened epiglottis and aryepiglottic folds with prominent gas in the epiglottis extending into the left tonsil, concerning for acute epiglottitis from a gas-forming organism  CULTURES: Wound 02/07 >>   ANTIBIOTICS: Amp - sulbactam 02/07 >>    LINES/TUBES: ETT 02/07 >>    ASSESSMENT / PLAN:  PULMONARY A: UAO, s/p abscess drainage 02/07 Vent status P:   Cont vent support - settings reviewed and/or adjusted Wean in PSV mode as tolerated Cont vent bundle Daily SBT if/when meets criteria  CARDIOVASCULAR A:  No issues P:  Monitor  RENAL A:   Mild hyponatremia Borderline hypokalemia P:   Monitor BMET intermittently Monitor I/Os Correct electrolytes as indicated IVFs adjsuted  GASTROINTESTINAL A:   No issues P:   SUP: Famotidine  HEMATOLOGIC A:   No issues P:  DVT px: SLMWH Monitor CBC intermittently Transfuse per usual ICU guidelines  INFECTIOUS A:   Epiglottitis with abscess P:   Monitor temp, WBC count Micro and abx as above  ENDOCRINE A:   Steroid induced hyperglycemia   P:   SSI while on systemic steroids  NEUROLOGIC A:   Agitation ICU/vent associated discomfort P:   RASS goal: -1, -2 Propofol and fent infusions   FAMILY  - Updates: I spoke @ length with pt's wife and updated on her husband's status. She expressed frustration over what she  deemed to be poor communication and initially requested transfer to another center. i indicated that I thought this would present unnecessary risk and assured her that I would take primary responsibility for keeping her apprised of her husband's status and the plan  CCM time: 60 mins The above time includes time spent in consultation with patient and/or family members and reviewing care plan on multidisciplinary rounds. Discussed with Dr Abundio Miu, MD PCCM service Mobile [phone] Pager [phone]     07/08/2015, 2:03 PM

## 2015-07-08 NOTE — Progress Notes (Signed)
Assessments remain unchanged from previous, Dr Sung Amabile has made rounds and updated. Family updated by Dr Sung Amabile and Dr. Jenne Campus. Plan is to take pt to CT tomorrow to assess neck edema before extubation. Continues on the ventilator at 45% fio2, rate 16. Sedation continues as charted. Lines patent and intact. Family at bedside have no concerns regarding care at this time.

## 2015-07-08 NOTE — Progress Notes (Signed)
Chaplain rounded in the unit and provided a compassionate presence to the two visitors and patient. Jefm Petty [phone]

## 2015-07-08 NOTE — Progress Notes (Signed)
Nutrition Follow-up    INTERVENTION:   Coordination of Care: per MD Sung Amabile, plan is for possible extubation tomorrow. Initiation of EN not appropriate at this time due to poc with possible extubation within 24 hours. If unable to extubate within 24-48 hours, recommend initiation of EN. Continue to assess   NUTRITION DIAGNOSIS:   Inadequate oral intake related to acute illness as evidenced by NPO status.  GOAL:   Provide needs based on ASPEN/SCCM guidelines  MONITOR:    (Energy Intake, Anthropometrics, Electrolyte/Renal Profile, GLucose Profile)  REASON FOR ASSESSMENT:   Ventilator    ASSESSMENT:    Pt admitted with epiglottal abcess s/p drainage in OR, pt intubated for airway protection due to respiratory distress prior to procedure and remains on vent postop  Past Medical History  Diagnosis Date  . Alcohol abuse   . Chlamydia   . Asthma   . Thyroid disorder     during childhood  . Constipation   . Scabies   . Testicular/scrotal pain 02/03/2015    Normal exam and [place] x several.    . Penile pain 02/03/2015    Normal exams x several    Diet Order:  Diet NPO time specified  Skin:  Reviewed, no issues  Electrolyte and Renal Profile:  Recent Labs Lab 07/07/15 0851  BUN 12  CREATININE 0.96  NA 132*  K 3.5   Glucose Profile:   Recent Labs  07/07/15 1511 07/08/15 1131 07/08/15 1536  GLUCAP 192* 167* 146*   Meds: D5-1/2 NS with Kcl at 100 ml/hr, ss novolog, decadron, diprivan  Height:   Ht Readings from Last 1 Encounters:  07/07/15  (1.676 m)    Weight:   Wt Readings from Last 1 Encounters:  07/07/15 270 lb (122.471 kg)    Filed Weights   07/07/15 0805  Weight: 270 lb (122.471 kg)    BMI:  Body mass index is 43.6 kg/(m^2).  Estimated Nutritional Needs:   Kcal:  1610-9604 kcals (11-14 kcals/kg) using wt of 122.5  Protein:  118-148 g (2.0-2.5 g/kg) using IBW 59 kg  Fluid:  1475-1770 mL (25-30 ml/kg)  HIGH Care Level  Romelle Starcher  MS, RD, LDN [phone] Pager  [phone] Weekend/On-Call Pager

## 2015-07-08 NOTE — Care Management (Signed)
Patient presented with epiglottis abscess requiring I/D and intubation.  Currently on ventilator.  Abscess is improving.  Hope to extubate within the next 24-48 hours.  Patient does not have payor

## 2015-07-08 NOTE — Progress Notes (Signed)
07/08/2015 9:44 AM  [name] [id number]  Post-Op Day 1    Temp:  [97.7 F (36.5 C)-100 F (37.8 C)] 97.7 F (36.5 C) (02/08 0800) Pulse Rate:  [50-113] 97 (02/08 0900) Resp:  [17-24] 19 (02/08 0355) BP: (90-134)/(56-74) 90/65 mmHg (02/08 0900) SpO2:  [95 %-100 %] 96 % (02/08 0900) FiO2 (%):  [45 %-50 %] 45 % (02/08 0800),     Intake/Output Summary (Last 24 hours) at 07/08/15 0944 Last data filed at 07/08/15 0900  Gross per 24 hour  Intake 4709.27 ml  Output   4000 ml  Net 709.27 ml    Results for orders placed or performed during the hospital encounter of 07/07/15 (from the past 24 hour(s))  Glucose, capillary     Status: Abnormal   Collection Time: 07/07/15  3:11 PM  Result Value Ref Range   Glucose-Capillary 192 (H) 65 - 99 mg/dL  Wound culture     Status: None (Preliminary result)   Collection Time: 07/07/15  3:11 PM  Result Value Ref Range   Specimen Description ABSCESS    Special Requests NONE    Gram Stain PENDING    Culture HOLDING FOR POSSIBLE PATHOGEN    Report Status PENDING   Triglycerides     Status: None   Collection Time: 07/07/15  3:33 PM  Result Value Ref Range   Triglycerides 55 <150 mg/dL  MRSA PCR Screening     Status: None   Collection Time: 07/07/15  6:10 PM  Result Value Ref Range   MRSA by PCR NEGATIVE NEGATIVE  Blood gas, arterial     Status: Abnormal   Collection Time: 07/07/15  6:40 PM  Result Value Ref Range   FIO2 0.50    Delivery systems VENTILATOR    Mode PRESSURE REGULATED VOLUME CONTROL    VT 500 mL   LHR 15 resp/min   Peep/cpap 5.0 cm H20   pH, Arterial 7.34 (L) 7.350 - 7.450   pCO2 arterial 41 32.0 - 48.0 mmHg   pO2, Arterial 133 (H) 83.0 - 108.0 mmHg   Bicarbonate 22.1 21.0 - 28.0 mEq/L   Acid-base deficit 3.5 (H) 0.0 - 2.0 mmol/L   O2 Saturation 98.9 %   Patient temperature 37.0    Collection site RIGHT RADIAL    Sample type ARTERIAL DRAW    Allens test (pass/fail) POSITIVE (A) PASS    SUBJECTIVE:  Intubated  sedated  OBJECTIVE:  Same  IMPRESSION:  epiglottitis  PLAN:  Cultures still pending.  Continue meds.  Recommend repeat CT tomorrow.  If much improved would let cuff down, if moving air around tube could try extubation.    Camar Guyton T 07/08/2015, 9:44 AM

## 2015-07-08 NOTE — Progress Notes (Signed)
07/08/2015 5:04 PM  [name] [id number]  Post-Op Day 1    Temp:  [97.7 F (36.5 C)-98.5 F (36.9 C)] 97.8 F (36.6 C) (02/08 1200) Pulse Rate:  [50-97] 66 (02/08 1500) Resp:  [19] 19 (02/08 0355) BP: (90-120)/(59-101) 113/59 mmHg (02/08 1500) SpO2:  [95 %-100 %] 96 % (02/08 1500) FiO2 (%):  [40 %-50 %] 40 % (02/08 1131),     Intake/Output Summary (Last 24 hours) at 07/08/15 1704 Last data filed at 07/08/15 1547  Gross per 24 hour  Intake 3440.96 ml  Output   3950 ml  Net -509.04 ml    Results for orders placed or performed during the hospital encounter of 07/07/15 (from the past 24 hour(s))  MRSA PCR Screening     Status: None   Collection Time: 07/07/15  6:10 PM  Result Value Ref Range   MRSA by PCR NEGATIVE NEGATIVE  Blood gas, arterial     Status: Abnormal   Collection Time: 07/07/15  6:40 PM  Result Value Ref Range   FIO2 0.50    Delivery systems VENTILATOR    Mode PRESSURE REGULATED VOLUME CONTROL    VT 500 mL   LHR 15 resp/min   Peep/cpap 5.0 cm H20   pH, Arterial 7.34 (L) 7.350 - 7.450   pCO2 arterial 41 32.0 - 48.0 mmHg   pO2, Arterial 133 (H) 83.0 - 108.0 mmHg   Bicarbonate 22.1 21.0 - 28.0 mEq/L   Acid-base deficit 3.5 (H) 0.0 - 2.0 mmol/L   O2 Saturation 98.9 %   Patient temperature 37.0    Collection site RIGHT RADIAL    Sample type ARTERIAL DRAW    Allens test (pass/fail) POSITIVE (A) PASS  Glucose, capillary     Status: Abnormal   Collection Time: 07/08/15 11:31 AM  Result Value Ref Range   Glucose-Capillary 167 (H) 65 - 99 mg/dL  Glucose, capillary     Status: Abnormal   Collection Time: 07/08/15  3:36 PM  Result Value Ref Range   Glucose-Capillary 146 (H) 65 - 99 mg/dL    SUBJECTIVE:  Intubated sedated  OBJECTIVE:  same  IMPRESSION:  Epiglottitis-spoke with lab Gm pos cocci likely strep C, gm neg rod looks like H. Flu both should be sensitive to Unasyn.  Final culture result should be back tomorrow.    PLAN:  Neck CT in am if much  improved will deflate cuff if moving air around will try extubation.  Long discussion with wife/family this pm.  They have been thoroughly informed about all aspects of his care including culture reports and CT/extubation plan.  They asked questions and all were answered.  Nursing was present.  Family is very pleased with his care during this hospital stay.    Lido Maske T 07/08/2015, 5:04 PM

## 2015-07-09 ENCOUNTER — Inpatient Hospital Stay: Payer: MEDICAID

## 2015-07-09 ENCOUNTER — Encounter: Payer: Self-pay | Admitting: Radiology

## 2015-07-09 DIAGNOSIS — R52 Pain, unspecified: Secondary | ICD-10-CM

## 2015-07-09 DIAGNOSIS — J9600 Acute respiratory failure, unspecified whether with hypoxia or hypercapnia: Secondary | ICD-10-CM

## 2015-07-09 DIAGNOSIS — J0510 Acute epiglottitis without obstruction: Secondary | ICD-10-CM

## 2015-07-09 LAB — GLUCOSE, CAPILLARY
GLUCOSE-CAPILLARY: 109 mg/dL — AB (ref 65–99)
GLUCOSE-CAPILLARY: 112 mg/dL — AB (ref 65–99)
GLUCOSE-CAPILLARY: 167 mg/dL — AB (ref 65–99)
Glucose-Capillary: 137 mg/dL — ABNORMAL HIGH (ref 65–99)
Glucose-Capillary: 182 mg/dL — ABNORMAL HIGH (ref 65–99)
Glucose-Capillary: 191 mg/dL — ABNORMAL HIGH (ref 65–99)

## 2015-07-09 LAB — BASIC METABOLIC PANEL
Anion gap: 1 — ABNORMAL LOW (ref 5–15)
BUN: 19 mg/dL (ref 6–20)
CALCIUM: 8.2 mg/dL — AB (ref 8.9–10.3)
CO2: 27 mmol/L (ref 22–32)
CREATININE: 0.99 mg/dL (ref 0.61–1.24)
Chloride: 115 mmol/L — ABNORMAL HIGH (ref 101–111)
GFR calc non Af Amer: >60 mL/min (ref >60)
GLUCOSE: 178 mg/dL — AB (ref 65–99)
Potassium: 5.1 mmol/L (ref 3.5–5.1)
Sodium: 143 mmol/L (ref 135–145)

## 2015-07-09 LAB — CBC
HCT: 38.8 % — ABNORMAL LOW (ref 40.0–52.0)
Hemoglobin: 12.9 g/dL — ABNORMAL LOW (ref 13.0–18.0)
MCH: 31.2 pg (ref 26.0–34.0)
MCHC: 33.2 g/dL (ref 32.0–36.0)
MCV: 94 fL (ref 80.0–100.0)
PLATELETS: 188 10*3/uL (ref 150–440)
RBC: 4.12 MIL/uL — AB (ref 4.40–5.90)
RDW: 13.5 % (ref 11.5–14.5)
WBC: 19.9 10*3/uL — ABNORMAL HIGH (ref 3.8–10.6)

## 2015-07-09 MED ORDER — IOHEXOL 300 MG/ML  SOLN
75.0000 mL | Freq: Once | INTRAMUSCULAR | Status: AC | PRN
  Administered 2015-07-09: 75 mL via INTRAVENOUS

## 2015-07-09 MED ORDER — RACEPINEPHRINE HCL 2.25 % IN NEBU
0.5000 mL | INHALATION_SOLUTION | RESPIRATORY_TRACT | Status: DC | PRN
Start: 2015-07-09 — End: 2015-07-10
  Filled 2015-07-09: qty 0.5

## 2015-07-09 MED ORDER — DEXTROSE IN LACTATED RINGERS 5 % IV SOLN
INTRAVENOUS | Status: DC
  Administered 2015-07-09 – 2015-07-10 (×2): via INTRAVENOUS

## 2015-07-09 MED ORDER — MORPHINE SULFATE (PF) 2 MG/ML IV SOLN
1.0000 mg | INTRAVENOUS | Status: DC | PRN
  Administered 2015-07-09 – 2015-07-10 (×2): 2 mg via INTRAVENOUS
  Filled 2015-07-09 (×2): qty 1

## 2015-07-09 MED ORDER — DEXAMETHASONE SODIUM PHOSPHATE 10 MG/ML IJ SOLN
5.0000 mg | Freq: Three times a day (TID) | INTRAMUSCULAR | Status: DC
  Administered 2015-07-09 – 2015-07-10 (×3): 5 mg via INTRAVENOUS
  Filled 2015-07-09 (×3): qty 1

## 2015-07-09 NOTE — Progress Notes (Signed)
Nutrition Follow-up     INTERVENTION:   Coordination of Care: await diet progression post extubation  NUTRITION DIAGNOSIS:   Inadequate oral intake related to acute illness as evidenced by NPO status.Continues  GOAL:   Provide needs based on ASPEN/SCCM guidelines  MONITOR:    (Energy Intake, Anthropometrics, Electrolyte/Renal Profile, GLucose Profile)  REASON FOR ASSESSMENT:   Ventilator    ASSESSMENT:    Pt on vent this AM, CT neck with significant improvement, pt passed SBT and extubated to 40% aerosol mask  Diet Order:  Diet NPO time specified  Skin:  Reviewed, no issues  Electrolyte and Renal Profile:  Recent Labs Lab 07/07/15 0851 07/09/15 0340  BUN 12 19  CREATININE 0.96 0.99  NA 132* 143  K 3.5 5.1   Glucose Profile:  Recent Labs  07/09/15 0417 07/09/15 0757 07/09/15 1125  GLUCAP 137* 182* 191*   Meds: reviewed  Height:   Ht Readings from Last 1 Encounters:  07/07/15  (1.676 m)    Weight:   Wt Readings from Last 1 Encounters:  07/07/15 270 lb (122.471 kg)    Ideal Body Weight:     BMI:  Body mass index is 43.6 kg/(m^2).  Estimated Nutritional Needs:   Kcal:  1610-9604 kcals (11-14 kcals/kg) using wt of 122.5  Protein:  118-148 g (2.0-2.5 g/kg) using IBW 59 kg  Fluid:  1475-1770 mL (25-30 ml/kg)  HIGH Care Level  Romelle Starcher MS, RD, LDN [phone] Pager  [phone] Weekend/On-Call Pager

## 2015-07-09 NOTE — Progress Notes (Signed)
Pt AO on Venti mask, o2 sats 97%. Hr 79. Eating ice chips per order. Report given to [facility].

## 2015-07-09 NOTE — Progress Notes (Signed)
Pt requested items that were locked up. Security notified.

## 2015-07-09 NOTE — Progress Notes (Signed)
RN notified eLink, spoke to Dr Belia Heman regarding patient's HR bradycardic sometimes in the 40s, BP at lowest systolic 80s, patient receiving diprivan and fentanyl gtt for sedation, patient does not do much except when sedation is decreased to help bring up HR and BP, patient would stack breath on ventilator.  MD states "hold the course, call me back if his HR gets down to 35"

## 2015-07-09 NOTE — Progress Notes (Signed)
Extubated to 40% aerosol mask,some mild stridor noted,coughing large amounts of secretions from upper airway

## 2015-07-09 NOTE — Progress Notes (Signed)
07/09/2015 8:13 AM  [name] [id number]  Post-Op Day 2    Temp:  [97.8 F (36.6 C)-99 F (37.2 C)] 98.9 F (37.2 C) (02/09 0400) Pulse Rate:  [44-97] 44 (02/09 0600) BP: (87-120)/(53-101) 101/61 mmHg (02/09 0600) SpO2:  [93 %-100 %] 98 % (02/09 0600) FiO2 (%):  [40 %] 40 % (02/09 0335),     Intake/Output Summary (Last 24 hours) at 07/09/15 0813 Last data filed at 07/09/15 0500  Gross per 24 hour  Intake 1261.39 ml  Output    850 ml  Net 411.39 ml    Results for orders placed or performed during the hospital encounter of 07/07/15 (from the past 24 hour(s))  Glucose, capillary     Status: Abnormal   Collection Time: 07/08/15 11:31 AM  Result Value Ref Range   Glucose-Capillary 167 (H) 65 - 99 mg/dL  Glucose, capillary     Status: Abnormal   Collection Time: 07/08/15  3:36 PM  Result Value Ref Range   Glucose-Capillary 146 (H) 65 - 99 mg/dL  Glucose, capillary     Status: Abnormal   Collection Time: 07/08/15  7:48 PM  Result Value Ref Range   Glucose-Capillary 167 (H) 65 - 99 mg/dL  Glucose, capillary     Status: Abnormal   Collection Time: 07/09/15 12:44 AM  Result Value Ref Range   Glucose-Capillary 167 (H) 65 - 99 mg/dL  Basic metabolic panel     Status: Abnormal   Collection Time: 07/09/15  3:40 AM  Result Value Ref Range   Sodium 143 135 - 145 mmol/L   Potassium 5.1 3.5 - 5.1 mmol/L   Chloride 115 (H) 101 - 111 mmol/L   CO2 27 22 - 32 mmol/L   Glucose, Bld 178 (H) 65 - 99 mg/dL   BUN 19 6 - 20 mg/dL   Creatinine, Ser 4.09 0.61 - 1.24 mg/dL   Calcium 8.2 (L) 8.9 - 10.3 mg/dL   GFR calc non Af Amer >60 >60 mL/min   GFR calc Af Amer >60 >60 mL/min   Anion gap 1 (L) 5 - 15  CBC     Status: Abnormal   Collection Time: 07/09/15  3:40 AM  Result Value Ref Range   WBC 19.9 (H) 3.8 - 10.6 K/uL   RBC 4.12 (L) 4.40 - 5.90 MIL/uL   Hemoglobin 12.9 (L) 13.0 - 18.0 g/dL   HCT 81.1 (L) 91.4 - 78.2 %   MCV 94.0 80.0 - 100.0 fL   MCH 31.2 26.0 - 34.0 pg   MCHC  33.2 32.0 - 36.0 g/dL   RDW 95.6 21.3 - 08.6 %   Platelets 188 150 - 440 K/uL  Glucose, capillary     Status: Abnormal   Collection Time: 07/09/15  4:17 AM  Result Value Ref Range   Glucose-Capillary 137 (H) 65 - 99 mg/dL  Glucose, capillary     Status: Abnormal   Collection Time: 07/09/15  7:57 AM  Result Value Ref Range   Glucose-Capillary 182 (H) 65 - 99 mg/dL    SUBJECTIVE:  intubated  OBJECTIVE:  same  IMPRESSION:  epiglotitis  PLAN:  CT this am.    Ronald Montgomery T 07/09/2015, 8:13 AM

## 2015-07-09 NOTE — Progress Notes (Signed)
Patient extubated. Respiratory at side. Family at side. Patient attempting to cough. Stridor noted. Dr. Sung Amabile at bedside.

## 2015-07-09 NOTE — Progress Notes (Signed)
PULMONARY / CRITICAL CARE MEDICINE   Name: Ronald Montgomery MRN: [id number] DOB: 1981/02/07    ADMISSION DATE:  07/07/2015 CONSULTATION DATE:  02/08  REFERRING MD:  Jenne Campus   HISTORY OF PRESENT ILLNESS:   Presented to ED on 3 separate occasions with ST and last time with stridorous respirations and respiratory distress. Required intubation. Went to OR where an epiglottal abscess was drained. Remained intubated post op and PCCM asked to manage vent and related issues   SUBJECTIVE:  Passed SBT. CT neck reveals significant improvement. Discussed with Dr Jenne Campus who approved of trial extubation. Cuff leak test revealed good leak around tube when cuff deflated  VITAL SIGNS: BP 117/68 mmHg  Pulse 43  Temp(Src) 97.9 F (36.6 C) (Oral)  Resp 19  Ht  (1.676 m)  Wt 122.471 kg (270 lb)  BMI 43.60 kg/m2  SpO2 97%  HEMODYNAMICS:    VENTILATOR SETTINGS: Vent Mode:  [-] PRVC FiO2 (%):  [40 %] 40 % Set Rate:  [15 bmp] 15 bmp Vt Set:  [500 mL] 500 mL PEEP:  [5 cmH20] 5 cmH20 Pressure Support:  [10 cmH20] 10 cmH20  INTAKE / OUTPUT: I/O last 3 completed shifts: In: 3220.4 [I.V.:2670.4; IV Piggyback:550] Out: 4250 [Urine:4250]  PHYSICAL EXAMINATION: General: WDWN, RASS -3. + F/C on WUA Neuro: MAEs, no focal deficits, CNs intact HEENT: NCAT, WNL Cardiovascular: reg, no M Lungs: clear Abdomen: soft, NT, + BS Ext: warm, no edema  LABS:  BMET  Recent Labs Lab 07/07/15 0851 07/09/15 0340  NA 132* 143  K 3.5 5.1  CL 101 115*  CO2 24 27  BUN 12 19  CREATININE 0.96 0.99  GLUCOSE 164* 178*    Electrolytes  Recent Labs Lab 07/07/15 0851 07/09/15 0340  CALCIUM 8.5* 8.2*    CBC  Recent Labs Lab 07/07/15 0851 07/09/15 0340  WBC 26.4* 19.9*  HGB 14.5 12.9*  HCT 42.8 38.8*  PLT 160 188    Coag's No results for input(s): APTT, INR in the last 168 hours.  Sepsis Markers No results for input(s): LATICACIDVEN, PROCALCITON, O2SATVEN in the last 168  hours.  ABG  Recent Labs Lab 07/07/15 1840  PHART 7.34*  PCO2ART 41  PO2ART 133*    Liver Enzymes No results for input(s): AST, ALT, ALKPHOS, BILITOT, ALBUMIN in the last 168 hours.  Cardiac Enzymes No results for input(s): TROPONINI, PROBNP in the last 168 hours.  Glucose  Recent Labs Lab 07/08/15 1536 07/08/15 1948 07/09/15 0044 07/09/15 0417 07/09/15 0757 07/09/15 1125  GLUCAP 146* 167* 167* 137* 182* 191*    CXR: LLV, NAD    STUDIES:  02/07 CT neck: Thickened epiglottis and aryepiglottic folds with prominent gas in the epiglottis extending into the left tonsil, concerning for acute epiglottitis from a gas-forming organism 02/09 CT neck: Intubated, with fluid throughout the pharynx. The epiglottis is obscured today. No new pharyngeal or laryngeal abnormality is identified, the parapharyngeal and retropharyngeal spaces remain normal  CULTURES: Wound 02/07 >> group C strep, GNRs >>   ANTIBIOTICS: Amp-sulbactam 02/07 >>   LINES/TUBES: ETT 02/07 >> 02/09   ASSESSMENT / PLAN:  PULMONARY A: UAO, s/p abscess drainage 02/07 Vent status P:   Monitor in ICU post extubation Decrease dexamethasone Humidified face tent PRN racemic epi PRN BiPAP  CARDIOVASCULAR A:  No issues P:  Monitor  RENAL A:   Mild hyponatremia resolved Borderline hypokalemia, resolved P:   Monitor BMET intermittently Monitor I/Os Correct electrolytes as indicated IVFs adjusted  GASTROINTESTINAL A:  No issues P:   SUP: N/I post extubation NPO post extubation  HEMATOLOGIC A:   No issues P:  DVT px: LMWH Monitor CBC intermittently Transfuse per usual guidelines  INFECTIOUS A:   Epiglottitis with abscess P:   Monitor temp, WBC count Micro and abx as above  ENDOCRINE A:   Steroid induced hyperglycemia   P:   SSI while on systemic steroids  NEUROLOGIC A:   Agitation, controlled  Pain P:   RASS goal: 0 Low dose PRN morphine   FAMILY  Wife and  mother updated @ bedside  CCM time: 50 mins The above time includes time spent in consultation with patient and/or family members and reviewing care plan on multidisciplinary rounds. Discussed with Dr Abundio Miu, MD PCCM service Mobile [phone] Pager [phone]   07/09/2015, 1:32 PM

## 2015-07-09 NOTE — Progress Notes (Signed)
RN took over care of patient around 2330 last night.  Patient sedated on diprivan and fentanyl gtt, minimally responsive except raises arms at times and stacking breaths when sedation decreased.  Patient remains on ventilator, settings unchanged.  Vital signs stable except bradycardia HR in 40s at times, Dr Belia Heman notified. Foley in place and patent, output adequate urine.  Wife at bedside throughout shift.  Patient currently in no apparent distress.  Plan to go for CT today to assess for improvement in edema with possibility of cuff deflation and extubation pending result and patient's response per MD's note.

## 2015-07-09 NOTE — Progress Notes (Signed)
07/09/2015 6:49 PM  Ronald Montgomery [id number]  Post-Op Day 2    Temp:  [97.9 F (36.6 C)-99 F (37.2 C)] 97.9 F (36.6 C) (02/09 0800) Pulse Rate:  [41-64] 43 (02/09 1200) BP: (87-117)/(53-73) 117/68 mmHg (02/09 1200) SpO2:  [93 %-100 %] 97 % (02/09 1200) FiO2 (%):  [40 %] 40 % (02/09 1140),     Intake/Output Summary (Last 24 hours) at 07/09/15 1849 Last data filed at 07/09/15 1200  Gross per 24 hour  Intake 1329.5 ml  Output    300 ml  Net 1029.5 ml    Results for orders placed or performed during the hospital encounter of 07/07/15 (from the past 24 hour(s))  Glucose, capillary     Status: Abnormal   Collection Time: 07/08/15  7:48 PM  Result Value Ref Range   Glucose-Capillary 167 (H) 65 - 99 mg/dL  Glucose, capillary     Status: Abnormal   Collection Time: 07/09/15 12:44 AM  Result Value Ref Range   Glucose-Capillary 167 (H) 65 - 99 mg/dL  Basic metabolic panel     Status: Abnormal   Collection Time: 07/09/15  3:40 AM  Result Value Ref Range   Sodium 143 135 - 145 mmol/L   Potassium 5.1 3.5 - 5.1 mmol/L   Chloride 115 (H) 101 - 111 mmol/L   CO2 27 22 - 32 mmol/L   Glucose, Bld 178 (H) 65 - 99 mg/dL   BUN 19 6 - 20 mg/dL   Creatinine, Ser 4.09 0.61 - 1.24 mg/dL   Calcium 8.2 (L) 8.9 - 10.3 mg/dL   GFR calc non Af Amer >60 >60 mL/min   GFR calc Af Amer >60 >60 mL/min   Anion gap 1 (L) 5 - 15  CBC     Status: Abnormal   Collection Time: 07/09/15  3:40 AM  Result Value Ref Range   WBC 19.9 (H) 3.8 - 10.6 K/uL   RBC 4.12 (L) 4.40 - 5.90 MIL/uL   Hemoglobin 12.9 (L) 13.0 - 18.0 g/dL   HCT 81.1 (L) 91.4 - 78.2 %   MCV 94.0 80.0 - 100.0 fL   MCH 31.2 26.0 - 34.0 pg   MCHC 33.2 32.0 - 36.0 g/dL   RDW 95.6 21.3 - 08.6 %   Platelets 188 150 - 440 K/uL  Glucose, capillary     Status: Abnormal   Collection Time: 07/09/15  4:17 AM  Result Value Ref Range   Glucose-Capillary 137 (H) 65 - 99 mg/dL  Glucose, capillary     Status: Abnormal   Collection Time: 07/09/15   7:57 AM  Result Value Ref Range   Glucose-Capillary 182 (H) 65 - 99 mg/dL  Glucose, capillary     Status: Abnormal   Collection Time: 07/09/15 11:25 AM  Result Value Ref Range   Glucose-Capillary 191 (H) 65 - 99 mg/dL  Glucose, capillary     Status: Abnormal   Collection Time: 07/09/15  4:17 PM  Result Value Ref Range   Glucose-Capillary 112 (H) 65 - 99 mg/dL    SUBJECTIVE:  Extubated earlier today.  Awake alert.     OBJECTIVE:  Voice excellent, no stridor noted.  Pain much better than preop.  IMPRESSION:  Epiglottitis appears resolving.   PLAN:  Continue IV meds tonight and continue stay in ICU.  If continues to improve overnight, could transfer to the floor in AM.  I will be in the OR in [place] in the morning.  Will ask ICU team to assess early,  if doing well would ask them to transfer out of ICU.  I will come by to see patient when done in the OR and do bedside laryngoscopy to assess epiglottis.  Possible to DC to home tomorrow afternoon.    Kahliyah Dick T 07/09/2015, 6:49 PM

## 2015-07-10 LAB — CBC
HEMATOCRIT: 38.5 % — AB (ref 40.0–52.0)
HEMOGLOBIN: 13 g/dL (ref 13.0–18.0)
MCH: 31 pg (ref 26.0–34.0)
MCHC: 33.8 g/dL (ref 32.0–36.0)
MCV: 91.7 fL (ref 80.0–100.0)
PLATELETS: 208 10*3/uL (ref 150–440)
RBC: 4.19 MIL/uL — AB (ref 4.40–5.90)
RDW: 13.6 % (ref 11.5–14.5)
WBC: 14.6 10*3/uL — ABNORMAL HIGH (ref 3.8–10.6)

## 2015-07-10 LAB — BASIC METABOLIC PANEL
Anion gap: 5 (ref 5–15)
BUN: 29 mg/dL — ABNORMAL HIGH (ref 6–20)
CHLORIDE: 113 mmol/L — AB (ref 101–111)
CO2: 28 mmol/L (ref 22–32)
CREATININE: 1.12 mg/dL (ref 0.61–1.24)
Calcium: 8.2 mg/dL — ABNORMAL LOW (ref 8.9–10.3)
GFR calc non Af Amer: >60 mL/min (ref >60)
Glucose, Bld: 134 mg/dL — ABNORMAL HIGH (ref 65–99)
Potassium: 4 mmol/L (ref 3.5–5.1)
Sodium: 146 mmol/L — ABNORMAL HIGH (ref 135–145)

## 2015-07-10 LAB — GLUCOSE, CAPILLARY
Glucose-Capillary: 118 mg/dL — ABNORMAL HIGH (ref 65–99)
Glucose-Capillary: 127 mg/dL — ABNORMAL HIGH (ref 65–99)
Glucose-Capillary: 140 mg/dL — ABNORMAL HIGH (ref 65–99)

## 2015-07-10 MED ORDER — DEXAMETHASONE SODIUM PHOSPHATE 10 MG/ML IJ SOLN
10.0000 mg | Freq: Two times a day (BID) | INTRAMUSCULAR | Status: DC
  Administered 2015-07-10 – 2015-07-11 (×3): 10 mg via INTRAVENOUS
  Filled 2015-07-10 (×3): qty 1

## 2015-07-10 MED ORDER — ALUM & MAG HYDROXIDE-SIMETH 200-200-20 MG/5ML PO SUSP
15.0000 mL | Freq: Three times a day (TID) | ORAL | Status: DC | PRN
  Administered 2015-07-10: 15 mL via ORAL
  Filled 2015-07-10: qty 30

## 2015-07-10 MED ORDER — SODIUM CHLORIDE 0.9 % IV SOLN
3.0000 g | Freq: Four times a day (QID) | INTRAVENOUS | Status: DC
  Administered 2015-07-10 – 2015-07-11 (×3): 3 g via INTRAVENOUS
  Filled 2015-07-10 (×4): qty 3

## 2015-07-10 MED ORDER — AMOXICILLIN-POT CLAVULANATE 875-125 MG PO TABS
1.0000 | ORAL_TABLET | Freq: Two times a day (BID) | ORAL | Status: DC
  Administered 2015-07-10 – 2015-07-11 (×3): 1 via ORAL
  Filled 2015-07-10 (×3): qty 1

## 2015-07-10 MED ORDER — ZOLPIDEM TARTRATE 5 MG PO TABS
5.0000 mg | ORAL_TABLET | Freq: Every evening | ORAL | Status: DC | PRN
  Administered 2015-07-10: 5 mg via ORAL
  Filled 2015-07-10: qty 1

## 2015-07-10 MED ORDER — ENOXAPARIN SODIUM 40 MG/0.4ML ~~LOC~~ SOLN
40.0000 mg | Freq: Two times a day (BID) | SUBCUTANEOUS | Status: DC
  Administered 2015-07-10 (×2): 40 mg via SUBCUTANEOUS
  Filled 2015-07-10 (×2): qty 0.4

## 2015-07-10 NOTE — Progress Notes (Signed)
Patient alert, no complaints of pain. Foley removed 9:33 am today, diet ordered. Tx to floor.

## 2015-07-10 NOTE — Progress Notes (Signed)
07/10/2015 11:30 AM  Ronald Montgomery [id number]  Post-Op Day 3    Temp:  [98.5 F (36.9 C)-100.2 F (37.9 C)] 98.5 F (36.9 C) (02/10 1116) Pulse Rate:  [43-85] 50 (02/10 1116) Resp:  [14-24] 17 (02/10 1116) BP: (117-168)/(68-101) 135/68 mmHg (02/10 1116) SpO2:  [93 %-99 %] 96 % (02/10 1116) FiO2 (%):  [35 %-40 %] 35 % (02/10 0200),     Intake/Output Summary (Last 24 hours) at 07/10/15 1130 Last data filed at 07/10/15 0900  Gross per 24 hour  Intake 2294.5 ml  Output   1450 ml  Net  844.5 ml    Results for orders placed or performed during the hospital encounter of 07/07/15 (from the past 24 hour(s))  Glucose, capillary     Status: Abnormal   Collection Time: 07/09/15  4:17 PM  Result Value Ref Range   Glucose-Capillary 112 (H) 65 - 99 mg/dL  Glucose, capillary     Status: Abnormal   Collection Time: 07/09/15  7:59 PM  Result Value Ref Range   Glucose-Capillary 109 (H) 65 - 99 mg/dL  Glucose, capillary     Status: Abnormal   Collection Time: 07/10/15 12:04 AM  Result Value Ref Range   Glucose-Capillary 127 (H) 65 - 99 mg/dL  Glucose, capillary     Status: Abnormal   Collection Time: 07/10/15  4:02 AM  Result Value Ref Range   Glucose-Capillary 118 (H) 65 - 99 mg/dL  Basic metabolic panel     Status: Abnormal   Collection Time: 07/10/15  4:25 AM  Result Value Ref Range   Sodium 146 (H) 135 - 145 mmol/L   Potassium 4.0 3.5 - 5.1 mmol/L   Chloride 113 (H) 101 - 111 mmol/L   CO2 28 22 - 32 mmol/L   Glucose, Bld 134 (H) 65 - 99 mg/dL   BUN 29 (H) 6 - 20 mg/dL   Creatinine, Ser 4.09 0.61 - 1.24 mg/dL   Calcium 8.2 (L) 8.9 - 10.3 mg/dL   GFR calc non Af Amer >60 >60 mL/min   GFR calc Af Amer >60 >60 mL/min   Anion gap 5 5 - 15  CBC     Status: Abnormal   Collection Time: 07/10/15  4:25 AM  Result Value Ref Range   WBC 14.6 (H) 3.8 - 10.6 K/uL   RBC 4.19 (L) 4.40 - 5.90 MIL/uL   Hemoglobin 13.0 13.0 - 18.0 g/dL   HCT 81.1 (L) 91.4 - 78.2 %   MCV 91.7 80.0 -  100.0 fL   MCH 31.0 26.0 - 34.0 pg   MCHC 33.8 32.0 - 36.0 g/dL   RDW 95.6 21.3 - 08.6 %   Platelets 208 150 - 440 K/uL  Glucose, capillary     Status: Abnormal   Collection Time: 07/10/15  7:54 AM  Result Value Ref Range   Glucose-Capillary 140 (H) 65 - 99 mg/dL    SUBJECTIVE:  Feeling great, hungry swallowing with minimal pain  OBJECTIVE:  Laryngoscopy at bedside, epiglottis almost back to normal  Left side/tip with scar from I & D.  Airway patent  IMPRESSION:  Epiglottitis  PLAN:  Continue IV meds today, advance diet.  Plan to D/C tomorrow on PO meds.    [name] 07/10/2015, 11:30 AM

## 2015-07-10 NOTE — Progress Notes (Signed)
PULMONARY / CRITICAL CARE MEDICINE   Name: Ronald Montgomery MRN: [id number] DOB: August 13, 1980    ADMISSION DATE:  07/07/2015 CONSULTATION DATE:  02/08  REFERRING MD:  Jenne Campus   HISTORY OF PRESENT ILLNESS:   Presented to ED on 3 separate occasions with ST and last time with stridorous respirations and respiratory distress. Required intubation. Went to OR where an epiglottal abscess was drained. Remained intubated post op and PCCM asked to manage vent and related issues   SUBJECTIVE:  No distress. No new complaints. No stridor. Strong cough and strong voice  VITAL SIGNS: BP 134/84 mmHg  Pulse 58  Temp(Src) 99.4 F (37.4 C) (Axillary)  Resp 19  Ht  (1.676 m)  Wt 122.471 kg (270 lb)  BMI 43.60 kg/m2  SpO2 97%  HEMODYNAMICS:    VENTILATOR SETTINGS: Vent Mode:  [-] PRVC FiO2 (%):  [35 %-40 %] 35 % Set Rate:  [15 bmp] 15 bmp Vt Set:  [500 mL] 500 mL PEEP:  [5 cmH20] 5 cmH20  INTAKE / OUTPUT: I/O last 3 completed shifts: In: 2394.5 [I.V.:1894.5; IV Piggyback:500] Out: 1350 [Urine:1350]  PHYSICAL EXAMINATION: General: WDWN, RASS 0. + F/C  Neuro: no focal deficits, CNs intact HEENT: NCAT, WNL Cardiovascular: reg, no M Lungs: clear Abdomen: soft, NT, + BS Ext: warm, no edema  LABS:  BMET  Recent Labs Lab 07/07/15 0851 07/09/15 0340 07/10/15 0425  NA 132* 143 146*  K 3.5 5.1 4.0  CL 101 115* 113*  CO2 BUN 12 19 29*  CREATININE 0.96 0.99 1.12  GLUCOSE 164* 178* 134*    Electrolytes  Recent Labs Lab 07/07/15 0851 07/09/15 0340 07/10/15 0425  CALCIUM 8.5* 8.2* 8.2*    CBC  Recent Labs Lab 07/07/15 0851 07/09/15 0340 07/10/15 0425  WBC 26.4* 19.9* 14.6*  HGB 14.5 12.9* 13.0  HCT 42.8 38.8* 38.5*  PLT 160 188 208    Coag's No results for input(s): APTT, INR in the last 168 hours.  Sepsis Markers No results for input(s): LATICACIDVEN, PROCALCITON, O2SATVEN in the last 168 hours.  ABG  Recent Labs Lab 07/07/15 1840  PHART  7.34*  PCO2ART 41  PO2ART 133*    Liver Enzymes No results for input(s): AST, ALT, ALKPHOS, BILITOT, ALBUMIN in the last 168 hours.  Cardiac Enzymes No results for input(s): TROPONINI, PROBNP in the last 168 hours.  Glucose  Recent Labs Lab 07/09/15 1125 07/09/15 1617 07/09/15 1959 07/10/15 0004 07/10/15 0402 07/10/15 0754  GLUCAP 191* 112* 109* 127* 118* 140*    CXR: No new film    STUDIES:  02/07 CT neck: Thickened epiglottis and aryepiglottic folds with prominent gas in the epiglottis extending into the left tonsil, concerning for acute epiglottitis from a gas-forming organism 02/09 CT neck: Intubated, with fluid throughout the pharynx. The epiglottis is obscured today. No new pharyngeal or laryngeal abnormality is identified, the parapharyngeal and retropharyngeal spaces remain normal  CULTURES: Wound 02/07 >> group C strep, GNRs (beta lactamase negative)   ANTIBIOTICS: Amp-sulbactam 02/07 >> 02/10 Amp - clav 02/10 >>   LINES/TUBES: ETT 02/07 >> 02/09   ASSESSMENT / PLAN:  INFECTIOUS A:   Epiglottitis with abscess S/P abscess drainage by ENT P:   Monitor temp, WBC count Micro and abx as above Further eval and mgmt per ENT Changed to Augmentin 02/10. Duration of abx per ENT - I would think 10-14 days would be appropriate  PULMONARY A: UAO, resolved Vent status, resolved P:   DC  dexamethasone DC humidified air DC PRN BIPAP (he never required it) DC PRN racemic epi (never required)  RENAL A:   Mild hyponatremia resolved Borderline hypokalemia, resolved P:   DC IVFs Full liquid diet  ENDOCRINE A:   Steroid induced hyperglycemia, mild   P:   DC systemic steroids DC SSI  NEUROLOGIC A:   Pain P:   Cont low dose PRN morphine  Transfer to med-surg today. ENT to see later in day per Dr Mikey Bussing note yesterday     FAMILY  Mother updated @ bedside  PCCM will sign off. Please call if we can be of further assistance  Billy Fischer, MD PCCM service Mobile [phone] Pager [phone]   07/10/2015, 10:59 AM

## 2015-07-11 LAB — WOUND CULTURE

## 2015-07-11 MED ORDER — AMOXICILLIN-POT CLAVULANATE 875-125 MG PO TABS: 1.0000 | ORAL_TABLET | Freq: Two times a day (BID) | ORAL | Status: DC

## 2015-07-11 NOTE — Progress Notes (Signed)
Pt d/c home; d/c instructions reviewed w/ pt; pt understanding was verbalized; IVs removed catheters in tact, gauze dressings applied; all pt questions answered; pt left unit via wheelchair accompanied by staff 

## 2015-07-11 NOTE — Discharge Summary (Cosign Needed)
07/11/2015 8:49 AM  Ronald Montgomery [id number]  Post-Op Day 4    Temp:  [98.3 F (36.8 C)-98.5 F (36.9 C)] 98.3 F (36.8 C) (02/11 0428) Pulse Rate:  [42-59] 42 (02/11 0428) Resp:  [17-20] 20 (02/11 0428) BP: (118-135)/(57-84) 118/57 mmHg (02/11 0428) SpO2:  [95 %-98 %] 95 % (02/11 0428),     Intake/Output Summary (Last 24 hours) at 07/11/15 0849 Last data filed at 07/10/15 1800  Gross per 24 hour  Intake    900 ml  Output    700 ml  Net    200 ml    No results found for this or any previous visit (from the past 24 hour(s)).  SUBJECTIVE:  Feeling much better, appetite good.  Plan to discharge to home today.    OBJECTIVE:  Voice good, no stridor  IMPRESSION:  Epiglotitis much improved.  Hospital Course: [name] was admitted following emergent intubation and incision and drainage of epiglottic abscess. After being intubated approximate 48 hours which was extubated is doing well status post extubation. By mouth intake has improved pain has essentially resolved. Plan to discharge home today follow-up 1 week  PLAN:  Discharge on Augmentin, F/U 1 week  [name] T 07/11/2015, 8:49 AM

## 2015-07-11 NOTE — Discharge Instructions (Signed)
Follow all MD discharge instructions. Take all medications as prescribed. Keep all follow up appointments. If your symptoms return, call your doctor. If you experience any new symptoms that are of concern to you or that are bothersome to you, call your doctor. For all questions and/or concerns, call your doctor.  If you experience trouble breathing or swallowing, call your doctor immediately. If you experience swelling in your throat, call your doctor.   If you have a medical emergency, call 911

## 2015-07-11 NOTE — Care Management Note (Signed)
Case Management Note  Patient Details  Name: Ronald Montgomery MRN: [id number] Date of Birth: Mar 16, 1981  Subjective/Objective:         Late entry: [name] was out of his room wandering and this Clinical research associate discussed discharge plans with [name]. Provided Mother with a list of local clinics who provide medical and medication assistance to uninsured patients. Provided Mother with the [facility] program coupon and pharmacy list and explained that [name] is to take both the coupon and his prescriptions to a pharmacy on the list. Mother verbalized understanding.            Action/Plan:   Expected Discharge Date:                  Expected Discharge Plan:     In-House Referral:     Discharge planning Services     Post Acute Care Choice:    Choice offered to:     DME Arranged:    DME Agency:     HH Arranged:    HH Agency:     Status of Service:     Medicare Important Message Given:    Date Medicare IM Given:    Medicare IM give by:    Date Additional Medicare IM Given:    Additional Medicare Important Message give by:     If discussed at Long Length of Stay Meetings, dates discussed:    Additional Comments:  Natalynn Pedone A, RN 07/11/2015, 5:03 PM

## 2015-07-15 LAB — ANAEROBIC CULTURE

## 2015-07-18 ENCOUNTER — Emergency Department
Admission: EM | Admit: 2015-07-18 | Discharge: 2015-07-18 | Disposition: A | Discharge status: Home / Self Care | Payer: Self-pay | Attending: Emergency Medicine | Admitting: Emergency Medicine

## 2015-07-18 ENCOUNTER — Emergency Department: Payer: Self-pay

## 2015-07-18 ENCOUNTER — Encounter: Payer: Self-pay | Admitting: Radiology

## 2015-07-18 DIAGNOSIS — F419 Anxiety disorder, unspecified: Secondary | ICD-10-CM | POA: Insufficient documentation

## 2015-07-18 DIAGNOSIS — J029 Acute pharyngitis, unspecified: Secondary | ICD-10-CM | POA: Insufficient documentation

## 2015-07-18 DIAGNOSIS — F17210 Nicotine dependence, cigarettes, uncomplicated: Secondary | ICD-10-CM | POA: Insufficient documentation

## 2015-07-18 DIAGNOSIS — R1310 Dysphagia, unspecified: Secondary | ICD-10-CM

## 2015-07-18 LAB — CBC WITH DIFFERENTIAL/PLATELET
BASOS ABS: 0 10*3/uL (ref 0–0.1)
BASOS PCT: 0 %
EOS ABS: 0.1 10*3/uL (ref 0–0.7)
EOS PCT: 1 %
HCT: 42.9 % (ref 40.0–52.0)
Hemoglobin: 14.5 g/dL (ref 13.0–18.0)
Lymphocytes Relative: 13 %
Lymphs Abs: 1.6 10*3/uL (ref 1.0–3.6)
MCH: 30.9 pg (ref 26.0–34.0)
MCHC: 33.8 g/dL (ref 32.0–36.0)
MCV: 91.5 fL (ref 80.0–100.0)
MONO ABS: 1.1 10*3/uL — AB (ref 0.2–1.0)
Monocytes Relative: 9 %
Neutro Abs: 9.6 10*3/uL — ABNORMAL HIGH (ref 1.4–6.5)
Neutrophils Relative %: 77 %
PLATELETS: 166 10*3/uL (ref 150–440)
RBC: 4.69 MIL/uL (ref 4.40–5.90)
RDW: 13.9 % (ref 11.5–14.5)
WBC: 12.4 10*3/uL — ABNORMAL HIGH (ref 3.8–10.6)

## 2015-07-18 LAB — COMPREHENSIVE METABOLIC PANEL
ALBUMIN: 3.5 g/dL (ref 3.5–5.0)
ALT: 37 U/L (ref 17–63)
AST: 24 U/L (ref 15–41)
Alkaline Phosphatase: 42 U/L (ref 38–126)
Anion gap: 5 (ref 5–15)
BUN: 16 mg/dL (ref 6–20)
CHLORIDE: 109 mmol/L (ref 101–111)
CO2: 23 mmol/L (ref 22–32)
Calcium: 8.5 mg/dL — ABNORMAL LOW (ref 8.9–10.3)
Creatinine, Ser: 0.84 mg/dL (ref 0.61–1.24)
GFR calc Af Amer: >60 mL/min (ref >60)
GLUCOSE: 125 mg/dL — AB (ref 65–99)
POTASSIUM: 4.5 mmol/L (ref 3.5–5.1)
Sodium: 137 mmol/L (ref 135–145)
Total Bilirubin: 0.7 mg/dL (ref 0.3–1.2)
Total Protein: 6.8 g/dL (ref 6.5–8.1)

## 2015-07-18 MED ORDER — LIDOCAINE VISCOUS 2 % MT SOLN: 20.0000 mL | OROMUCOSAL | Status: DC | PRN

## 2015-07-18 MED ORDER — IOHEXOL 300 MG/ML  SOLN
75.0000 mL | Freq: Once | INTRAMUSCULAR | Status: AC | PRN
  Administered 2015-07-18: 75 mL via INTRAVENOUS

## 2015-07-18 MED ORDER — HYDROMORPHONE HCL 1 MG/ML IJ SOLN
1.0000 mg | Freq: Once | INTRAMUSCULAR | Status: AC
  Administered 2015-07-18: 1 mg via INTRAVENOUS
  Filled 2015-07-18: qty 1

## 2015-07-18 NOTE — ED Provider Notes (Signed)
[facility] Emergency Department Provider Note     Time seen: ----------------------------------------- 12:02 PM on 07/18/2015 -----------------------------------------    I have reviewed the triage vital signs and the nursing notes.   HISTORY  Chief Complaint Dysphagia    HPI Ronald Montgomery is a 35 y.o. male who presents to ER for pain with swallowing. Patient states he was up all night last night having difficulty swallowing. Patient was just admitted in the hospital and had surgery for epiglottitis and an abscess on his epiglottis. Patient states he just had nasopharyngoscopy by ENT this past Wednesday 3 days ago and everything looked normal. Patient states she's been eating what he wanted to, has had sudden recurrence and a sore throat and pain with swallowing. She has not had difficulty breathing.   Past Medical History  Diagnosis Date  . Alcohol abuse   . Chlamydia   . Asthma   . Thyroid disorder     during childhood  . Constipation   . Scabies   . Testicular/scrotal pain 02/03/2015    Normal exam and [place] x several.    . Penile pain 02/03/2015    Normal exams x several     Patient Active Problem List   Diagnosis Date Noted  . Epiglottitis 07/07/2015  . Penile pain 02/03/2015  . Testicular/scrotal pain 02/03/2015    Past Surgical History  Procedure Laterality Date  . Abcess drainage      groin area  . Rhytidectomy neck / cheek / chin      due to accident  . Cyst excision      top of head  . Direct laryngoscopy  07/07/2015    Procedure: DIRECT LARYNGOSCOPY;  Surgeon: Linus Salmons, MD;  Location: [facility] ORS;  Service: ENT;;  . Incision and drainage abscess N/A 07/07/2015    Procedure: INCISION AND DRAINAGE ABSCESS;  Surgeon: Linus Salmons, MD;  Location: [facility] ORS;  Service: ENT;  Laterality: N/A;  epiglottic abscess    Allergies Review of patient's allergies indicates no known allergies.  Social History Social History  Substance  Use Topics  . Smoking status: Current Every Day Smoker -- 0.50 packs/day    Types: Cigarettes  . Smokeless tobacco: Not on file  . Alcohol Use: 0.0 oz/week    0 Standard drinks or equivalent per week     Comment: ocasional    Review of Systems Constitutional: Negative for fever. Eyes: Negative for visual changes. ENT: Positive for sore throat and pain with swallowing Cardiovascular: Negative for chest pain. Respiratory: Negative for shortness of breath. Gastrointestinal: Negative for abdominal pain, vomiting and diarrhea. Genitourinary: Negative for dysuria. Musculoskeletal: Negative for back pain. Skin: Negative for rash. Neurological: Negative for headaches, focal weakness or numbness.  10-point ROS otherwise negative.  ____________________________________________   PHYSICAL EXAM:  VITAL SIGNS: ED Triage Vitals  Enc Vitals Group     BP 07/18/15 1137 132/77 mmHg     Pulse Rate 07/18/15 1137 86     Resp 07/18/15 1137 18     Temp 07/18/15 1137 98 F (36.7 C)     Temp Source 07/18/15 1137 Oral     SpO2 07/18/15 1137 96 %     Weight 07/18/15 1137 252 lb (114.306 kg)     Height 07/18/15 1137  (1.676 m)     Head Cir --      Peak Flow --      Pain Score 07/18/15 1138 7     Pain Loc --  Pain Edu? --      Excl. in GC? --     Constitutional: Alert and oriented. Anxious, no acute distress Eyes: Conjunctivae are normal. PERRL. Normal extraocular movements. ENT   Head: Normocephalic and atraumatic.   Nose: No congestion/rhinnorhea.   Mouth/Throat: Mucous membranes are moist, mild pharyngeal erythema   Neck: No stridor. Cardiovascular: Normal rate, regular rhythm. Normal and symmetric distal pulses are present in all extremities. No murmurs, rubs, or gallops. Respiratory: Normal respiratory effort without tachypnea nor retractions. Breath sounds are clear and equal bilaterally. No wheezes/rales/rhonchi. Gastrointestinal: Soft and nontender. No  distention. No abdominal bruits.  Musculoskeletal: Nontender with normal range of motion in all extremities. No joint effusions.  No lower extremity tenderness nor edema. Neurologic:  Normal speech and language. No gross focal neurologic deficits are appreciated. Speech is normal. No gait instability. Skin:  Skin is warm, dry and intact. No rash noted. Psychiatric: Mood and affect are normal. Speech and behavior are normal. Patient exhibits appropriate insight and judgment. ___________________________________________  ED COURSE:  Pertinent labs & imaging results that were available during my care of the patient were reviewed by me and considered in my medical decision making (see chart for details). Patient with recurrent dysphagia after epiglottitis. I will obtain repeat CT of his neck with contrast ____________________________________________    LABS (pertinent positives/negatives)  Labs Reviewed  CBC WITH DIFFERENTIAL/PLATELET - Abnormal; Notable for the following:    WBC 12.4 (*)    Neutro Abs 9.6 (*)    Monocytes Absolute 1.1 (*)    All other components within normal limits  COMPREHENSIVE METABOLIC PANEL - Abnormal; Notable for the following:    Glucose, Bld 125 (*)    Calcium 8.5 (*)    All other components within normal limits    RADIOLOGY Images were viewed by me  CT neck with contrast IMPRESSION: Epiglottis has nearly returned to normal with mild residual edema on the left. No abscess or airway compromise. ____________________________________________  FINAL ASSESSMENT AND PLAN  Dysphagia  Plan: Patient with labs and imaging as dictated above. I will advise the patient to follow up with ENT should his symptoms continue. Currently based on CT he is stable for outpatient follow-up.   Emily Filbert, MD   Emily Filbert, MD 07/18/15 [id number]

## 2015-07-18 NOTE — Discharge Instructions (Signed)

## 2015-07-18 NOTE — ED Notes (Signed)
Pt had throat surgery for abscess on epiglottis on 2/7 by Dr. Jenne Campus. Pt was seen for follow up on 2/14 Pt reports he has been eating anything and last night he was eating pizza and took a few puffs from his cigarette and has had pain when swallowing and when not swallowing. He has more difficulty swallowing with solid foods. Currently in Augmentin BID and has been taking daily. Pt is tearful in triage. Denies difficulty breathing, but does feel congestion.

## 2015-07-18 NOTE — ED Notes (Signed)
Pt ambulatory to bathroom at this time, NAD noted. Pt tolerated well.

## 2015-07-18 NOTE — ED Notes (Signed)
MD Williams at bedside.  

## 2015-10-17 ENCOUNTER — Emergency Department
Admission: EM | Admit: 2015-10-17 | Discharge: 2015-10-17 | Disposition: A | Discharge status: Home / Self Care | Payer: Self-pay | Attending: Emergency Medicine | Admitting: Emergency Medicine

## 2015-10-17 ENCOUNTER — Encounter: Payer: Self-pay | Admitting: Emergency Medicine

## 2015-10-17 DIAGNOSIS — F1290 Cannabis use, unspecified, uncomplicated: Secondary | ICD-10-CM | POA: Insufficient documentation

## 2015-10-17 DIAGNOSIS — F1010 Alcohol abuse, uncomplicated: Secondary | ICD-10-CM | POA: Insufficient documentation

## 2015-10-17 DIAGNOSIS — J45909 Unspecified asthma, uncomplicated: Secondary | ICD-10-CM

## 2015-10-17 DIAGNOSIS — Z792 Long term (current) use of antibiotics: Secondary | ICD-10-CM | POA: Insufficient documentation

## 2015-10-17 DIAGNOSIS — J209 Acute bronchitis, unspecified: Secondary | ICD-10-CM

## 2015-10-17 DIAGNOSIS — Z79899 Other long term (current) drug therapy: Secondary | ICD-10-CM | POA: Insufficient documentation

## 2015-10-17 DIAGNOSIS — J45901 Unspecified asthma with (acute) exacerbation: Secondary | ICD-10-CM | POA: Insufficient documentation

## 2015-10-17 DIAGNOSIS — K219 Gastro-esophageal reflux disease without esophagitis: Secondary | ICD-10-CM | POA: Insufficient documentation

## 2015-10-17 MED ORDER — CEFUROXIME AXETIL 250 MG PO TABS
500.0000 mg | ORAL_TABLET | Freq: Once | ORAL | Status: AC
  Administered 2015-10-17: 500 mg via ORAL
  Filled 2015-10-17: qty 2

## 2015-10-17 MED ORDER — PREDNISONE 10 MG (21) PO TBPK: ORAL_TABLET | ORAL | Status: DC

## 2015-10-17 MED ORDER — CEFUROXIME AXETIL 500 MG PO TABS: 500.0000 mg | ORAL_TABLET | Freq: Two times a day (BID) | ORAL | Status: AC

## 2015-10-17 MED ORDER — PREDNISONE 20 MG PO TABS
60.0000 mg | ORAL_TABLET | Freq: Once | ORAL | Status: AC
  Administered 2015-10-17: 60 mg via ORAL
  Filled 2015-10-17: qty 3

## 2015-10-17 MED ORDER — FAMOTIDINE 20 MG PO TABS: 20.0000 mg | ORAL_TABLET | Freq: Two times a day (BID) | ORAL | Status: DC

## 2015-10-17 MED ORDER — HYDROCODONE-HOMATROPINE 5-1.5 MG/5ML PO SYRP: 5.0000 mL | ORAL_SOLUTION | Freq: Four times a day (QID) | ORAL | Status: DC | PRN

## 2015-10-17 NOTE — ED Notes (Signed)
Pt from home for cough xweek. Pt states he has had intermit fever and sore throat. Pt was seen a few months ago and had epiglottisits and states he is having increased saliva.

## 2015-10-17 NOTE — ED Provider Notes (Signed)
[facility] Emergency Department Provider Note  ____________________________________________  Time seen: Approximately 11:55 PM  I have reviewed the triage vital signs and the nursing notes.   HISTORY  Chief Complaint Cough and Sore Throat    HPI Ronald Montgomery is a 35 y.o. male with one-week history of cough, wheezing, head congestion, sore throat. Is also having a lot of reflux, heartburn symptoms. He is a smoker. He had asthma as a child.   Past Medical History  Diagnosis Date  . Alcohol abuse   . Chlamydia   . Asthma   . Thyroid disorder     during childhood  . Constipation   . Scabies   . Testicular/scrotal pain 02/03/2015    Normal exam and US x several.    . Penile pain 02/03/2015    Normal exams x several     Patient Active Problem List   Diagnosis Date Noted  . Epiglottitis 07/07/2015  . Penile pain 02/03/2015  . Testicular/scrotal pain 02/03/2015    Past Surgical History  Procedure Laterality Date  . Abcess drainage      groin area  . Rhytidectomy neck / cheek / chin      due to accident  . Cyst excision      top of head  . Direct laryngoscopy  07/07/2015    Procedure: DIRECT LARYNGOSCOPY;  Surgeon: Linus Salmonshapman McQueen, MD;  Location: [facility] ORS;  Service: ENT;;  . Incision and drainage abscess N/A 07/07/2015    Procedure: INCISION AND DRAINAGE ABSCESS;  Surgeon: Linus Salmonshapman McQueen, MD;  Location: [facility] ORS;  Service: ENT;  Laterality: N/A;  epiglottic abscess    Current Outpatient Rx  Name  Route  Sig  Dispense  Refill  . albuterol (PROVENTIL HFA;VENTOLIN HFA) 108 (90 Base) MCG/ACT inhaler   Inhalation   Inhale 2 puffs into the lungs every 6 (six) hours as needed for wheezing or shortness of breath. Patient not taking: Reported on 07/08/2015   1 Inhaler   2   . amoxicillin-clavulanate (AUGMENTIN) 875-125 MG tablet   Oral   Take 1 tablet by mouth 2 (two) times daily.   30 tablet   0   . cefUROXime (CEFTIN) 500 MG tablet   Oral  Take 1 tablet (500 mg total) by mouth 2 (two) times daily.   20 tablet   0   . famotidine (PEPCID) 20 MG tablet   Oral   Take 1 tablet (20 mg total) by mouth 2 (two) times daily.   60 tablet   0   . HYDROcodone-homatropine (HYCODAN) 5-1.5 MG/5ML syrup   Oral   Take 5 mLs by mouth every 6 (six) hours as needed for cough.   120 mL   0   . lidocaine (XYLOCAINE) 2 % solution   Mouth/Throat   Use as directed 20 mLs in the mouth or throat as needed for mouth pain.   100 mL   0   . predniSONE (STERAPRED UNI-PAK 21 TAB) 10 MG (21) TBPK tablet      6 tablets on day 1, 5 tablets on day 2, 4 tablets on day 3, etc...   21 tablet   0   . triamcinolone ointment (KENALOG) 0.5 %   Topical   Apply 1 application topically 2 (two) times daily. Patient not taking: Reported on 07/08/2015   30 g   0     Allergies Review of patient's allergies indicates no known allergies.  Family History  Problem Relation Age of Onset  .  Thyroid cancer Maternal Grandfather   . Heart attack Maternal Grandfather   . Diabetes Maternal Grandfather   . Skin cancer Mother   . Diabetes Mother     Social History Social History  Substance Use Topics  . Smoking status: Current Every Day Smoker -- 0.50 packs/day    Types: Cigarettes  . Smokeless tobacco: None  . Alcohol Use: 0.0 oz/week    0 Standard drinks or equivalent per week     Comment: ocasional    Review of Systems Constitutional: fever/chills Eyes: No visual changes. ENT:  sore throat. Cardiovascular: Denies chest pain. Respiratory: mild shortness of breath. Gastrointestinal: per hpi Genitourinary: Negative for dysuria. Musculoskeletal: Negative for back pain. Skin: Negative for rash. Neurological: Negative for headaches, focal weakness or numbness. 10-point ROS otherwise negative.  ____________________________________________   PHYSICAL EXAM:  VITAL SIGNS: ED Triage Vitals  Enc Vitals Group     BP 10/17/15 2231 122/71 mmHg      Pulse Rate 10/17/15 2231 75     Resp 10/17/15 2231 16     Temp 10/17/15 2232 98.7 F (37.1 C)     Temp Source 10/17/15 2231 Oral     SpO2 10/17/15 2231 100 %     Weight --      Height --      Head Cir --      Peak Flow --      Pain Score 10/17/15 2232 9     Pain Loc --      Pain Edu? --      Excl. in GC? --     Constitutional: Alert and oriented. Well appearing and in no acute distress. Eyes: Conjunctivae are normal. PERRL. EOMI. Ears:  Clear with normal landmarks. No erythema. Head: Atraumatic. Nose: No congestion/rhinnorhea. Mouth/Throat: Mucous membranes are moist.  Oropharynx mildly erythematous. No lesions. Neck:  Supple.  No adenopathy.   Cardiovascular: Normal rate, regular rhythm. Grossly normal heart sounds.  Good peripheral circulation. Respiratory: Normal respiratory effort.  No retractions. Wheezing bilaterally, mild. Musculoskeletal: Nml ROM of upper and lower extremity joints. Neurologic:  Normal speech and language. No gross focal neurologic deficits are appreciated. No gait instability. Skin:  Skin is warm, dry and intact. No rash noted. Psychiatric: Mood and affect are normal. Speech and behavior are normal.  ____________________________________________   LABS (all labs ordered are listed, but only abnormal results are displayed)  Labs Reviewed - No data to display ____________________________________________  EKG  See note ____________________________________________  RADIOLOGY    ____________________________________________   PROCEDURES  Procedure(s) performed: None  Critical Care performed: No  ____________________________________________   INITIAL IMPRESSION / ASSESSMENT AND PLAN / ED COURSE  Pertinent labs & imaging results that were available during my care of the patient were reviewed by me and considered in my medical decision making (see chart for details).  35 year old male with history of smoking presents with worsening URI,  cough and wheezing. Treated for bronchitis with Ceftin, prednisone taper and Hycodan cough syrup. He can return for any worsening symptoms. ____________________________________________   FINAL CLINICAL IMPRESSION(S) / ED DIAGNOSES  Final diagnoses:  Bronchitis with asthma, acute  Gastroesophageal reflux disease, esophagitis presence not specified      Ignacia Bayley, PA-C 10/17/15 2357  Sharyn Creamer, MD 10/18/15 [phone]

## 2015-10-17 NOTE — Discharge Instructions (Signed)
Acute Bronchitis Bronchitis is when the airways that extend from the windpipe into the lungs get red, puffy, and painful (inflamed). Bronchitis often causes thick spit (mucus) to develop. This leads to a cough. A cough is the most common symptom of bronchitis. In acute bronchitis, the condition usually begins suddenly and goes away over time (usually in 2 weeks). Smoking, allergies, and asthma can make bronchitis worse. Repeated episodes of bronchitis may cause more lung problems. HOME CARE  Rest.  Drink enough fluids to keep your pee (urine) clear or pale yellow (unless you need to limit fluids as told by your doctor).  Only take over-the-counter or prescription medicines as told by your doctor.  Avoid smoking and secondhand smoke. These can make bronchitis worse. If you are a smoker, think about using nicotine gum or skin patches. Quitting smoking will help your lungs heal faster.  Reduce the chance of getting bronchitis again by:  Washing your hands often.  Avoiding people with cold symptoms.  Trying not to touch your hands to your mouth, nose, or eyes.  Follow up with your doctor as told. GET HELP IF: Your symptoms do not improve after 1 week of treatment. Symptoms include:  Cough.  Fever.  Coughing up thick spit.  Body aches.  Chest congestion.  Chills.  Shortness of breath.  Sore throat. GET HELP RIGHT AWAY IF:   You have an increased fever.  You have chills.  You have severe shortness of breath.  You have bloody thick spit (sputum).  You throw up (vomit) often.  You lose too much body fluid (dehydration).  You have a severe headache.  You faint. MAKE SURE YOU:   Understand these instructions.  Will watch your condition.  Will get help right away if you are not doing well or get worse.   This information is not intended to replace advice given to you by your health care provider. Make sure you discuss any questions you have with your health care  provider.   Document Released: 11/02/2007 Document Revised: 01/16/2013 Document Reviewed: 11/06/2012 Elsevier Interactive Patient Education 2016 [employer].   Take antibiotics and prednisone as directed. Use cough syrup as needed. Return to the emergency room for any worsening symptoms. Gastroesophageal Reflux Disease, Adult Normally, food travels down the esophagus and stays in the stomach to be digested. However, when a person has gastroesophageal reflux disease (GERD), food and stomach acid move back up into the esophagus. When this happens, the esophagus becomes sore and inflamed. Over time, GERD can create small holes (ulcers) in the lining of the esophagus.  CAUSES This condition is caused by a problem with the muscle between the esophagus and the stomach (lower esophageal sphincter, or LES). Normally, the LES muscle closes after food passes through the esophagus to the stomach. When the LES is weakened or abnormal, it does not close properly, and that allows food and stomach acid to go back up into the esophagus. The LES can be weakened by certain dietary substances, medicines, and medical conditions, including:  Tobacco use.  Pregnancy.  Having a hiatal hernia.  Heavy alcohol use.  Certain foods and beverages, such as coffee, chocolate, onions, and peppermint. RISK FACTORS This condition is more likely to develop in:  People who have an increased body weight.  People who have connective tissue disorders.  People who use NSAID medicines. SYMPTOMS Symptoms of this condition include:  Heartburn.  Difficult or painful swallowing.  The feeling of having a lump in the throat.  Abitter taste in the mouth.  Bad breath.  Having a large amount of saliva.  Having an upset or bloated stomach.  Belching.  Chest pain.  Shortness of breath or wheezing.  Ongoing (chronic) cough or a night-time cough.  Wearing away of tooth enamel.  Weight loss. Different  conditions can cause chest pain. Make sure to see your health care provider if you experience chest pain. DIAGNOSIS Your health care provider will take a medical history and perform a physical exam. To determine if you have mild or severe GERD, your health care provider may also monitor how you respond to treatment. You may also have other tests, including:  An endoscopy toexamine your stomach and esophagus with a small camera.  A test thatmeasures the acidity level in your esophagus.  A test thatmeasures how much pressure is on your esophagus.  A barium swallow or modified barium swallow to show the shape, size, and functioning of your esophagus. TREATMENT The goal of treatment is to help relieve your symptoms and to prevent complications. Treatment for this condition may vary depending on how severe your symptoms are. Your health care provider may recommend:  Changes to your diet.  Medicine.  Surgery. HOME CARE INSTRUCTIONS Diet  Follow a diet as recommended by your health care provider. This may involve avoiding foods and drinks such as:  Coffee and tea (with or without caffeine).  Drinks that containalcohol.  Energy drinks and sports drinks.  Carbonated drinks or sodas.  Chocolate and cocoa.  Peppermint and mint flavorings.  Garlic and onions.  Horseradish.  Spicy and acidic foods, including peppers, chili powder, curry powder, vinegar, hot sauces, and barbecue sauce.  Citrus fruit juices and citrus fruits, such as oranges, lemons, and limes.  Tomato-based foods, such as red sauce, chili, salsa, and pizza with red sauce.  Fried and fatty foods, such as donuts, french fries, potato chips, and high-fat dressings.  High-fat meats, such as hot dogs and fatty cuts of red and white meats, such as rib eye steak, sausage, ham, and bacon.  High-fat dairy items, such as whole milk, butter, and cream cheese.  Eat small, frequent meals instead of large meals.  Avoid  drinking large amounts of liquid with your meals.  Avoid eating meals during the 2-3 hours before bedtime.  Avoid lying down right after you eat.  Do not exercise right after you eat. General Instructions  Pay attention to any changes in your symptoms.  Take over-the-counter and prescription medicines only as told by your health care provider. Do not take aspirin, ibuprofen, or other NSAIDs unless your health care provider told you to do so.  Do not use any tobacco products, including cigarettes, chewing tobacco, and e-cigarettes. If you need help quitting, ask your health care provider.  Wear loose-fitting clothing. Do not wear anything tight around your waist that causes pressure on your abdomen.  Raise (elevate) the head of your bed 6 inches (15cm).  Try to reduce your stress, such as with yoga or meditation. If you need help reducing stress, ask your health care provider.  If you are overweight, reduce your weight to an amount that is healthy for you. Ask your health care provider for guidance about a safe weight loss goal.  Keep all follow-up visits as told by your health care provider. This is important. SEEK MEDICAL CARE IF:  You have new symptoms.  You have unexplained weight loss.  You have difficulty swallowing, or it hurts to swallow.  You have  wheezing or a persistent cough.  Your symptoms do not improve with treatment.  You have a hoarse voice. SEEK IMMEDIATE MEDICAL CARE IF:  You have pain in your arms, neck, jaw, teeth, or back.  You feel sweaty, dizzy, or light-headed.  You have chest pain or shortness of breath.  You vomit and your vomit looks like blood or coffee grounds.  You faint.  Your stool is bloody or black.  You cannot swallow, drink, or eat.   This information is not intended to replace advice given to you by your health care provider. Make sure you discuss any questions you have with your health care provider.   Document Released:  02/23/2005 Document Revised: 02/04/2015 Document Reviewed: 09/10/2014 Elsevier Interactive Patient Education [employer] Inc.

## 2015-10-17 NOTE — ED Provider Notes (Signed)
-----------------------------------------   10:51 PM on 10/17/2015 -----------------------------------------  I, Phineas SemenGraydon Rhyleigh Grassel, attending physician, personally viewed and interpreted this EKG  EKG Time: 2248 Rate: 64 Rhythm: normal sinus rhythm Axis: left axis deviation Intervals: qtc 398 QRS: narrow ST changes: no st elevation Impression: abnormal ekg   Phineas SemenGraydon Zaeden Lastinger, MD 10/17/15 2252

## 2016-08-27 ENCOUNTER — Encounter: Payer: Self-pay | Admitting: Emergency Medicine

## 2016-08-27 ENCOUNTER — Emergency Department
Admission: EM | Admit: 2016-08-27 | Discharge: 2016-08-27 | Disposition: A | Discharge status: Home / Self Care | Payer: Self-pay | Attending: Emergency Medicine | Admitting: Emergency Medicine

## 2016-08-27 DIAGNOSIS — R300 Dysuria: Secondary | ICD-10-CM | POA: Insufficient documentation

## 2016-08-27 DIAGNOSIS — J45909 Unspecified asthma, uncomplicated: Secondary | ICD-10-CM | POA: Insufficient documentation

## 2016-08-27 DIAGNOSIS — F17210 Nicotine dependence, cigarettes, uncomplicated: Secondary | ICD-10-CM | POA: Insufficient documentation

## 2016-08-27 DIAGNOSIS — R102 Pelvic and perineal pain: Secondary | ICD-10-CM | POA: Insufficient documentation

## 2016-08-27 DIAGNOSIS — K6289 Other specified diseases of anus and rectum: Secondary | ICD-10-CM | POA: Insufficient documentation

## 2016-08-27 LAB — URINALYSIS, COMPLETE (UACMP) WITH MICROSCOPIC
BILIRUBIN URINE: NEGATIVE
Bacteria, UA: NONE SEEN
GLUCOSE, UA: NEGATIVE mg/dL
Hgb urine dipstick: NEGATIVE
KETONES UR: 5 mg/dL — AB
LEUKOCYTES UA: NEGATIVE
Nitrite: NEGATIVE
PH: 5 (ref 5.0–8.0)
PROTEIN: NEGATIVE mg/dL
RBC / HPF: NONE SEEN RBC/hpf (ref 0–5)
Specific Gravity, Urine: 1.026 (ref 1.005–1.030)
Squamous Epithelial / LPF: NONE SEEN

## 2016-08-27 LAB — CHLAMYDIA/NGC RT PCR (ARMC ONLY)
CHLAMYDIA TR: NOT DETECTED
N gonorrhoeae: NOT DETECTED

## 2016-08-27 MED ORDER — DICLOFENAC POTASSIUM 50 MG PO TABS: 50.0000 mg | ORAL_TABLET | Freq: Three times a day (TID) | ORAL | 0 refills | Status: DC

## 2016-08-27 NOTE — ED Notes (Signed)
Pt also denies any rectal bleeding or discharge of any kind at this time.

## 2016-08-27 NOTE — ED Triage Notes (Signed)
Pt states that on Wednesday "my wife of 16 years and I got a little freaky with another man". Pt states that they met him at a bar. Pt states that he had unprotected anal sex with him x4 hours and everything hurts. Pt reports that he has not had anal sex before "except for a couple of times by myself".  Pt also reports that he did not penetrate the other gentleman. Pt is concerned that due to a blow job he also is worried about his mouth because "it feels a little bit dry". Pt is ambulatory to triage with NAD noted.

## 2016-08-27 NOTE — ED Notes (Addendum)
Pt denies any changes in voiding; pt does states "partial diarrhea" type of stool for the last couple of days; pt denies any blood in stool; pt denies any burning or pain while urinating. Pt also wants to be checked to make sure "don't have an infection in my mouth"; pt denies any blisters, redness or difficulty swallowing.

## 2016-08-27 NOTE — ED Provider Notes (Signed)
[facility] Emergency Department Provider Note  ____________________________________________  Time seen: Approximately 8:59 PM  I have reviewed the triage vital signs and the nursing notes.   HISTORY  Chief Complaint Pelvic Pain and Rectal Pain    HPI Ronald Montgomery is a 36 y.o. male who presents emergency department complaining of rectal/anal pain, perineal pain, testicular pain 3 days. Patient reports that he and his wife "of 16 years" and decided to "get a little freaky" with another male. The patient reports that he met this individual at a bar and does not know patient's sexual history or presence of or absence of STDs.Patient reports that he had unprotected anal sex for greater than 4 hours. He now reports that "everything hurts." Patient reports that he did not perform anal sex on the other male. he denies any rectal bleeding. He denies any penile discharge. He does report some dysuria. Patient reports no constipation but does endorse some loose stools. He states that this is not actual diarrhea just "looser than normal." Patient reports that he also performed a blow job on the other male, patient denies any sore throat but does states that his mouth feels "dry." The patient denies any headache, visual changes, chest pain, shortness of breath, abdominal pain.   Past Medical History:  Diagnosis Date  . Alcohol abuse   . Asthma   . Chlamydia   . Constipation   . Penile pain 02/03/2015   Normal exams x several   . Scabies   . Testicular/scrotal pain 02/03/2015   Normal exam and [place] x several.    . Thyroid disorder    during childhood    Patient Active Problem List   Diagnosis Date Noted  . Epiglottitis 07/07/2015  . Penile pain 02/03/2015  . Testicular/scrotal pain 02/03/2015    Past Surgical History:  Procedure Laterality Date  . ABCESS DRAINAGE     groin area  . CYST EXCISION     top of head  . DIRECT LARYNGOSCOPY  07/07/2015   Procedure: DIRECT  LARYNGOSCOPY;  Surgeon: Beverly Gust, MD;  Location: [facility] ORS;  Service: ENT;;  . INCISION AND DRAINAGE ABSCESS N/A 07/07/2015   Procedure: INCISION AND DRAINAGE ABSCESS;  Surgeon: Beverly Gust, MD;  Location: [facility] ORS;  Service: ENT;  Laterality: N/A;  epiglottic abscess  . RHYTIDECTOMY NECK / CHEEK / CHIN     due to accident    Prior to Admission medications   Medication Sig Start Date End Date Taking? Authorizing Provider  albuterol (PROVENTIL HFA;VENTOLIN HFA) 108 (90 Base) MCG/ACT inhaler Inhale 2 puffs into the lungs every 6 (six) hours as needed for wheezing or shortness of breath. Patient not taking: Reported on 07/08/2015 06/29/15   Johnn Hai, PA-C  amoxicillin-clavulanate (AUGMENTIN) 875-125 MG tablet Take 1 tablet by mouth 2 (two) times daily. 07/11/15   Beverly Gust, MD  famotidine (PEPCID) 20 MG tablet Take 1 tablet (20 mg total) by mouth 2 (two) times daily. 10/17/15 10/16/16  Mortimer Fries, PA-C  HYDROcodone-homatropine (HYCODAN) 5-1.5 MG/5ML syrup Take 5 mLs by mouth every 6 (six) hours as needed for cough. 10/17/15   Mortimer Fries, PA-C  lidocaine (XYLOCAINE) 2 % solution Use as directed 20 mLs in the mouth or throat as needed for mouth pain. 07/18/15   Earleen Newport, MD  predniSONE (STERAPRED UNI-PAK 21 TAB) 10 MG (21) TBPK tablet 6 tablets on day 1, 5 tablets on day 2, 4 tablets on day 3, etc... 10/17/15   Herbie Baltimore  Tumey, PA-C  triamcinolone ointment (KENALOG) 0.5 % Apply 1 application topically 2 (two) times daily. Patient not taking: Reported on 07/08/2015 11/24/14   Earleen Newport, MD    Allergies Patient has no known allergies.  Family History  Problem Relation Age of Onset  . Skin cancer Mother   . Diabetes Mother   . Thyroid cancer Maternal Grandfather   . Heart attack Maternal Grandfather   . Diabetes Maternal Grandfather     Social History Social History  Substance Use Topics  . Smoking status: Current Every Day Smoker    Packs/day: 0.50     Types: Cigarettes  . Smokeless tobacco: Never Used  . Alcohol use 0.0 oz/week     Comment: ocasional     Review of Systems  Constitutional: No fever/chills Eyes: No visual changes. No discharge ENT: She reports his mouth feels "dry." Cardiovascular: no chest pain. Respiratory: no cough. No SOB. Gastrointestinal: No abdominal pain.  No nausea, no vomiting.  No diarrhea but endorses "loose" stools.  No constipation. She reports anal/rectal pain Genitourinary: She reports dysuria. No pyuria or hematuria. No penile discharge. Musculoskeletal: Negative for musculoskeletal pain. Skin: Negative for rash, abrasions, lacerations, ecchymosis. Neurological: Negative for headaches, focal weakness or numbness. 10-point ROS otherwise negative.  ____________________________________________   PHYSICAL EXAM:  VITAL SIGNS: ED Triage Vitals  Enc Vitals Group     BP 08/27/16 1912 (!) 129/94     Pulse Rate 08/27/16 1912 82     Resp 08/27/16 1912 16     Temp 08/27/16 1912 98.8 F (37.1 C)     Temp Source 08/27/16 1912 Oral     SpO2 08/27/16 1912 96 %     Weight 08/27/16 1915 255 lb (115.7 kg)     Height 08/27/16 1915 5' 6"  (1.676 m)     Head Circumference --      Peak Flow --      Pain Score 08/27/16 1912 6     Pain Loc --      Pain Edu? --      Excl. in [place]? --      Constitutional: Alert and oriented. Well appearing and in no acute distress. Eyes: Conjunctivae are normal. PERRL. EOMI. Head: Atraumatic. ENT:      Ears:       Nose: No congestion/rhinnorhea.      Mouth/Throat: Mucous membranes are moist. Oropharynx is nonerythematous and nonedematous. No lesions are noted. Uvula is midline. Neck: No stridor. Neck is supple with full range of motion Hematological/Lymphatic/Immunilogical: No cervical lymphadenopathy. Cardiovascular: Normal rate, regular rhythm. Normal S1 and S2.  Good peripheral circulation. Respiratory: Normal respiratory effort without tachypnea or retractions. Lungs  CTAB. Good air entry to the bases with no decreased or absent breath sounds. Gastrointestinal: Bowel sounds 4 quadrants. Soft and nontender to palpation. No guarding or rigidity. No palpable masses. No distention. No CVA tenderness. Rectal exam increase his level of pain but no palpable fissures, tears. Guaiac testing negative. Musculoskeletal: Full range of motion to all extremities. No gross deformities appreciated. Neurologic:  Normal speech and language. No gross focal neurologic deficits are appreciated.  Skin:  Skin is warm, dry and intact. No rash noted. Psychiatric: Mood and affect are normal. Speech and behavior are normal. Patient exhibits appropriate insight and judgement.   ____________________________________________   LABS (all labs ordered are listed, but only abnormal results are displayed)  Labs Reviewed  CHLAMYDIA/NGC RT PCR ([facility] ONLY)  URINALYSIS, COMPLETE (UACMP) WITH MICROSCOPIC   ____________________________________________  EKG   ____________________________________________  RADIOLOGY   No results found.  ____________________________________________    PROCEDURES  Procedure(s) performed:    Procedures    Medications - No data to display   ____________________________________________   INITIAL IMPRESSION / ASSESSMENT AND PLAN / ED COURSE  Pertinent labs & imaging results that were available during my care of the patient were reviewed by me and considered in my medical decision making (see chart for details).  Review of the [place] CSRS was performed in accordance of the [place] prior to dispensing any controlled drugs.  Clinical Course as of Aug 28 2122  Sat Aug 27, 2016  2123 Patient presents emergency department complaining of pain complaints status post anal sex. Symptoms are consistent with patient's history. He does endorse some dysuria and as such urine is sent for testing for gonorrhea and chlamydia as well as full urinalysis.  This time, there is no symptoms or lesions consistent with anal gonorrhea, chlamydia, syphilis. Patient is advised that if urinalysis returns with reassuring results, he is to follow-up with health Department for further daily testing to include repeat gonorrhea, chlamydia, syphilis, hepatitis, HIV.  [JC]    Clinical Course User Index [JC] Charline Bills Gwenevere Goga, PA-C    Patient's diagnosis is consistent with Anal pain status post anal sex. Rectal exam is reassuring with no indication of tears or hemorrhoids. Guaiac testing negative for bleeding. Patient was endorsing some dysuria and urinalysis returns with no indication of UTI and gonorrhea and chlamydia are negative. At this time, patient will be treated with NSAIDs for his rectal pain and advised to follow-up with health department for further STD testing. Patient is counseled on safe sex practices. Patient is given ED precautions to return to the ED for any worsening or new symptoms.     ____________________________________________  FINAL CLINICAL IMPRESSION(S) / ED DIAGNOSES  Final diagnoses:  None      NEW MEDICATIONS STARTED DURING THIS VISIT:  New Prescriptions   No medications on file        This chart was dictated using voice recognition software/Dragon. Despite best efforts to proofread, errors can occur which can change the meaning. Any change was purely unintentional.    Darletta Moll, PA-C 08/27/16 [place], MD 08/27/16 2340

## 2016-09-02 ENCOUNTER — Emergency Department
Admission: EM | Admit: 2016-09-02 | Discharge: 2016-09-02 | Disposition: A | Discharge status: Home / Self Care | Payer: Self-pay | Attending: Student in an Organized Health Care Education/Training Program | Admitting: Student in an Organized Health Care Education/Training Program

## 2016-09-02 ENCOUNTER — Encounter: Payer: Self-pay | Admitting: Emergency Medicine

## 2016-09-02 DIAGNOSIS — J45909 Unspecified asthma, uncomplicated: Secondary | ICD-10-CM | POA: Insufficient documentation

## 2016-09-02 DIAGNOSIS — F419 Anxiety disorder, unspecified: Secondary | ICD-10-CM | POA: Insufficient documentation

## 2016-09-02 DIAGNOSIS — R21 Rash and other nonspecific skin eruption: Secondary | ICD-10-CM | POA: Insufficient documentation

## 2016-09-02 DIAGNOSIS — F17210 Nicotine dependence, cigarettes, uncomplicated: Secondary | ICD-10-CM | POA: Insufficient documentation

## 2016-09-02 LAB — RAPID HIV SCREEN (HIV 1/2 AB+AG)
HIV 1/2 Antibodies: NONREACTIVE
HIV-1 P24 Antigen - HIV24: NONREACTIVE

## 2016-09-02 MED ORDER — DIAZEPAM 5 MG PO TABS: 5.0000 mg | ORAL_TABLET | Freq: Three times a day (TID) | ORAL | 0 refills | Status: DC | PRN

## 2016-09-02 MED ORDER — LORAZEPAM 1 MG PO TABS
2.0000 mg | ORAL_TABLET | Freq: Once | ORAL | Status: AC
  Administered 2016-09-02: 2 mg via ORAL
  Filled 2016-09-02: qty 2

## 2016-09-02 NOTE — ED Triage Notes (Addendum)
Pt ambulatory to triage in NAD, reports about a week ago had sexual encounter involving anal and oral sex, was seen in ED.  States 2 days ago, started having tingling/burning and redness to face and on inner thighs.  Pt reports using hydrocortisone w/o relief.

## 2016-09-02 NOTE — ED Provider Notes (Signed)
[name] Emergency Department Provider Note    First MD Initiated Contact with Patient 09/02/16 1951     (approximate)  I have reviewed the triage vital signs and the nursing notes.   HISTORY  Chief Complaint Rash    HPI Ronald Montgomery is a 36 y.o. male presents due to concern for burning and tingling to his face and inner thighs. Patient had multiple visits to the ER after sexual encounter roughly 1 week ago. See previous notes for further details. Has been very concerned that he contracted STD. Testing in the ER as well as public health department was negative for sexual transmitted infection. Patient very tearful in triage and crying stating that he thinks he has herpes. Put hydrocortisone on his face this morning without any relief. States it actually caused worsening burning.  Denies SI or HI   Past Medical History:  Diagnosis Date  . Alcohol abuse   . Asthma   . Chlamydia   . Constipation   . Penile pain 02/03/2015   Normal exams x several   . Scabies   . Testicular/scrotal pain 02/03/2015   Normal exam and [place] x several.    . Thyroid disorder    during childhood   Family History  Problem Relation Age of Onset  . Skin cancer Mother   . Diabetes Mother   . Thyroid cancer Maternal Grandfather   . Heart attack Maternal Grandfather   . Diabetes Maternal Grandfather    Past Surgical History:  Procedure Laterality Date  . ABCESS DRAINAGE     groin area  . CYST EXCISION     top of head  . DIRECT LARYNGOSCOPY  07/07/2015   Procedure: DIRECT LARYNGOSCOPY;  Surgeon: Linus Salmons, MD;  Location: [facility] ORS;  Service: ENT;;  . INCISION AND DRAINAGE ABSCESS N/A 07/07/2015   Procedure: INCISION AND DRAINAGE ABSCESS;  Surgeon: Linus Salmons, MD;  Location: [facility] ORS;  Service: ENT;  Laterality: N/A;  epiglottic abscess  . RHYTIDECTOMY NECK / CHEEK / CHIN     due to accident   Patient Active Problem List   Diagnosis Date Noted  . Epiglottitis  07/07/2015  . Penile pain 02/03/2015  . Testicular/scrotal pain 02/03/2015      Prior to Admission medications   Medication Sig Start Date End Date Taking? Authorizing Provider  albuterol (PROVENTIL HFA;VENTOLIN HFA) 108 (90 Base) MCG/ACT inhaler Inhale 2 puffs into the lungs every 6 (six) hours as needed for wheezing or shortness of breath. Patient not taking: Reported on 07/08/2015 06/29/15   Tommi Rumps, PA-C  amoxicillin-clavulanate (AUGMENTIN) 875-125 MG tablet Take 1 tablet by mouth 2 (two) times daily. 07/11/15   Linus Salmons, MD  diazepam (VALIUM) 5 MG tablet Take 1 tablet (5 mg total) by mouth every 8 (eight) hours as needed for anxiety. 09/02/16 09/02/17  Willy Eddy, MD  diclofenac (CATAFLAM) 50 MG tablet Take 1 tablet (50 mg total) by mouth 3 (three) times daily. 08/27/16   Delorise Royals Cuthriell, PA-C  famotidine (PEPCID) 20 MG tablet Take 1 tablet (20 mg total) by mouth 2 (two) times daily. 10/17/15 10/16/16  Ignacia Bayley, PA-C  HYDROcodone-homatropine (HYCODAN) 5-1.5 MG/5ML syrup Take 5 mLs by mouth every 6 (six) hours as needed for cough. 10/17/15   Ignacia Bayley, PA-C  lidocaine (XYLOCAINE) 2 % solution Use as directed 20 mLs in the mouth or throat as needed for mouth pain. 07/18/15   Emily Filbert, MD  predniSONE (STERAPRED UNI-PAK 21 TAB)  10 MG (21) TBPK tablet 6 tablets on day 1, 5 tablets on day 2, 4 tablets on day 3, etc... 10/17/15   Ignacia Bayley, PA-C  triamcinolone ointment (KENALOG) 0.5 % Apply 1 application topically 2 (two) times daily. Patient not taking: Reported on 07/08/2015 11/24/14   Emily Filbert, MD    Allergies Patient has no known allergies.    Social History Social History  Substance Use Topics  . Smoking status: Current Every Day Smoker    Packs/day: 0.50    Types: Cigarettes  . Smokeless tobacco: Never Used  . Alcohol use 0.0 oz/week     Comment: ocasional    Review of Systems Patient denies headaches, rhinorrhea, blurry vision,  numbness, shortness of breath, chest pain, edema, cough, abdominal pain, nausea, vomiting, diarrhea, dysuria, fevers, rashes or hallucinations unless otherwise stated above in HPI. ____________________________________________   PHYSICAL EXAM:  VITAL SIGNS: Vitals:   09/02/16 1915 09/02/16 2050  BP: (!) 144/96 139/89  Pulse: 76 72  Resp: 18 16  Temp: 98.2 F (36.8 C)     Constitutional: Alert and oriented. Tearful and anxious but in no acute distress. Eyes: Conjunctivae are normal. PERRL. EOMI. Head: Atraumatic. Nose: No congestion/rhinnorhea. Mouth/Throat: Mucous membranes are moist.  Oropharynx non-erythematous. Neck: No stridor. Painless ROM. No cervical spine tenderness to palpation Hematological/Lymphatic/Immunilogical: No cervical lymphadenopathy. Cardiovascular: Normal rate, regular rhythm. Grossly normal heart sounds.  Good peripheral circulation. Respiratory: Normal respiratory effort.  No retractions. Lungs CTAB. Gastrointestinal: Soft and nontender. No distention. No abdominal bruits. No CVA tenderness. Genitourinary: normal external genitalia Musculoskeletal: No lower extremity tenderness nor edema.  No joint effusions. Neurologic:  Normal speech and language. No gross focal neurologic deficits are appreciated. No gait instability. Skin:  Skin is warm, dry and intact. No rash noted. Psychiatric: Mood and affect are normal. Speech and behavior are normal.  ____________________________________________   LABS (all labs ordered are listed, but only abnormal results are displayed)  Results for orders placed or performed during the hospital encounter of 09/02/16 (from the past 24 hour(s))  Rapid HIV screen (HIV 1/2 Ab+Ag)     Status: None   Collection Time: 09/02/16  7:48 PM  Result Value Ref Range   HIV-1 P24 Antigen - HIV24 NON REACTIVE NON REACTIVE   HIV 1/2 Antibodies NON REACTIVE NON REACTIVE   Interpretation (HIV Ag Ab)      A non reactive test result means that  HIV 1 or HIV 2 antibodies and HIV 1 p24 antigen were not detected in the specimen.   ____________________________________________ ____________________________________________  RADIOLOGY   ____________________________________________   PROCEDURES  Procedure(s) performed:  Procedures    Critical Care performed: no ____________________________________________   INITIAL IMPRESSION / ASSESSMENT AND PLAN / ED COURSE  Pertinent labs & imaging results that were available during my care of the patient were reviewed by me and considered in my medical decision making (see chart for details).  DDX: anxiety, std, hiv  DERRIN CURREY is a 36 y.o. who presents to the ED with What appears to be anxiety attack. Rapid HIV screen sent as patient with high risk behavior. HIV is negative. He has no evidence of herpes lesions. Patient was given Ativan with improvement in symptoms. We'll provide referral to [facility] as the patient is having increasing symptoms of anxiety and likely benefit from outpatient services.  Have discussed with the patient and available family all diagnostics and treatments performed thus far and all questions were answered to the best of my ability.  The patient demonstrates understanding and agreement with plan.       ____________________________________________   FINAL CLINICAL IMPRESSION(S) / ED DIAGNOSES  Final diagnoses:  Anxiety      NEW MEDICATIONS STARTED DURING THIS VISIT:  New Prescriptions   DIAZEPAM (VALIUM) 5 MG TABLET    Take 1 tablet (5 mg total) by mouth every 8 (eight) hours as needed for anxiety.     Note:  This document was prepared using Dragon voice recognition software and may include unintentional dictation errors.    Willy Eddy, MD 09/02/16 [phone]

## 2016-09-02 NOTE — ED Notes (Signed)
First nurse aware pt is waiting on ride in lobby

## 2016-09-07 ENCOUNTER — Encounter: Payer: Self-pay | Admitting: *Deleted

## 2016-09-07 ENCOUNTER — Emergency Department
Admission: EM | Admit: 2016-09-07 | Discharge: 2016-09-07 | Disposition: A | Discharge status: Home / Self Care | Payer: Self-pay | Attending: Emergency Medicine | Admitting: Emergency Medicine

## 2016-09-07 DIAGNOSIS — J029 Acute pharyngitis, unspecified: Secondary | ICD-10-CM

## 2016-09-07 DIAGNOSIS — K219 Gastro-esophageal reflux disease without esophagitis: Secondary | ICD-10-CM | POA: Insufficient documentation

## 2016-09-07 DIAGNOSIS — F17210 Nicotine dependence, cigarettes, uncomplicated: Secondary | ICD-10-CM | POA: Insufficient documentation

## 2016-09-07 DIAGNOSIS — J45909 Unspecified asthma, uncomplicated: Secondary | ICD-10-CM | POA: Insufficient documentation

## 2016-09-07 LAB — CHLAMYDIA/NGC RT PCR (ARMC ONLY)
Chlamydia Tr: NOT DETECTED
N gonorrhoeae: NOT DETECTED

## 2016-09-07 MED ORDER — MAGIC MOUTHWASH W/LIDOCAINE: 5.0000 mL | Freq: Four times a day (QID) | ORAL | 0 refills | Status: DC

## 2016-09-07 MED ORDER — OMEPRAZOLE 10 MG PO CPDR: 10.0000 mg | DELAYED_RELEASE_CAPSULE | Freq: Every day | ORAL | 0 refills | Status: DC

## 2016-09-07 MED ORDER — FAMOTIDINE 20 MG PO TABS
20.0000 mg | ORAL_TABLET | Freq: Once | ORAL | Status: AC
  Administered 2016-09-07: 20 mg via ORAL
  Filled 2016-09-07: qty 1

## 2016-09-07 MED ORDER — PSEUDOEPH-BROMPHEN-DM 30-2-10 MG/5ML PO SYRP: 10.0000 mL | ORAL_SOLUTION | Freq: Four times a day (QID) | ORAL | 0 refills | Status: DC | PRN

## 2016-09-07 MED ORDER — LIDOCAINE VISCOUS 2 % MT SOLN
15.0000 mL | Freq: Once | OROMUCOSAL | Status: AC
  Administered 2016-09-07: 15 mL via OROMUCOSAL
  Filled 2016-09-07: qty 15

## 2016-09-07 MED ORDER — IBUPROFEN 600 MG PO TABS
600.0000 mg | ORAL_TABLET | Freq: Once | ORAL | Status: AC
  Administered 2016-09-07: 600 mg via ORAL
  Filled 2016-09-07: qty 1

## 2016-09-07 NOTE — ED Provider Notes (Signed)
[facility] Emergency Department Provider Note  ____________________________________________  Time seen: Approximately 10:31 PM  I have reviewed the triage vital signs and the nursing notes.   HISTORY  Chief Complaint Sore Throat    HPI Ronald Montgomery is a 36 y.o. male Who presents emergency department complaining of sore throat. Patient reports that he has had a for the last 2 days. Patient reports subjective, tactile, low-grade fevers.Patient reports minor nasal congestion, cough. He also endorses significant GERD symptoms. Patient denies any headache, visual changes, chest pain, shortness of breath, abdominal pain, nausea vomiting, diarrhea or constipation.  The patient has been seen by myself and other providers recently for possible STD with concern at that time for gonorrhea and Chlamydia orally.  Strep test and GC and chlamydia swabs will be obtained   Past Medical History:  Diagnosis Date  . Alcohol abuse   . Asthma   . Chlamydia   . Constipation   . Penile pain 02/03/2015   Normal exams x several   . Scabies   . Testicular/scrotal pain 02/03/2015   Normal exam and [place] x several.    . Thyroid disorder    during childhood    Patient Active Problem List   Diagnosis Date Noted  . Epiglottitis 07/07/2015  . Penile pain 02/03/2015  . Testicular/scrotal pain 02/03/2015    Past Surgical History:  Procedure Laterality Date  . ABCESS DRAINAGE     groin area  . CYST EXCISION     top of head  . DIRECT LARYNGOSCOPY  07/07/2015   Procedure: DIRECT LARYNGOSCOPY;  Surgeon: Linus Salmons, MD;  Location: [facility] ORS;  Service: ENT;;  . INCISION AND DRAINAGE ABSCESS N/A 07/07/2015   Procedure: INCISION AND DRAINAGE ABSCESS;  Surgeon: Linus Salmons, MD;  Location: [facility] ORS;  Service: ENT;  Laterality: N/A;  epiglottic abscess  . RHYTIDECTOMY NECK / CHEEK / CHIN     due to accident    Prior to Admission medications   Medication Sig Start Date End Date  Taking? Authorizing Provider  albuterol (PROVENTIL HFA;VENTOLIN HFA) 108 (90 Base) MCG/ACT inhaler Inhale 2 puffs into the lungs every 6 (six) hours as needed for wheezing or shortness of breath. Patient not taking: Reported on 07/08/2015 06/29/15   Tommi Rumps, PA-C  amoxicillin-clavulanate (AUGMENTIN) 875-125 MG tablet Take 1 tablet by mouth 2 (two) times daily. 07/11/15   Linus Salmons, MD  brompheniramine-pseudoephedrine-DM 30-2-10 MG/5ML syrup Take 10 mLs by mouth 4 (four) times daily as needed. 09/07/16   Delorise Royals Telisha Zawadzki, PA-C  diazepam (VALIUM) 5 MG tablet Take 1 tablet (5 mg total) by mouth every 8 (eight) hours as needed for anxiety. 09/02/16 09/02/17  Willy Eddy, MD  diclofenac (CATAFLAM) 50 MG tablet Take 1 tablet (50 mg total) by mouth 3 (three) times daily. 08/27/16   Delorise Royals Tyree Fluharty, PA-C  famotidine (PEPCID) 20 MG tablet Take 1 tablet (20 mg total) by mouth 2 (two) times daily. 10/17/15 10/16/16  Ignacia Bayley, PA-C  HYDROcodone-homatropine (HYCODAN) 5-1.5 MG/5ML syrup Take 5 mLs by mouth every 6 (six) hours as needed for cough. 10/17/15   Ignacia Bayley, PA-C  lidocaine (XYLOCAINE) 2 % solution Use as directed 20 mLs in the mouth or throat as needed for mouth pain. 07/18/15   Emily Filbert, MD  magic mouthwash w/lidocaine SOLN Take 5 mLs by mouth 4 (four) times daily. 09/07/16   Delorise Royals Idolina Mantell, PA-C  omeprazole (PRILOSEC) 10 MG capsule Take 1 capsule (10 mg total) by  mouth daily. 09/07/16   Delorise Royals Rut Betterton, PA-C  predniSONE (STERAPRED UNI-PAK 21 TAB) 10 MG (21) TBPK tablet 6 tablets on day 1, 5 tablets on day 2, 4 tablets on day 3, etc... 10/17/15   Ignacia Bayley, PA-C  triamcinolone ointment (KENALOG) 0.5 % Apply 1 application topically 2 (two) times daily. Patient not taking: Reported on 07/08/2015 11/24/14   Emily Filbert, MD    Allergies Patient has no known allergies.  Family History  Problem Relation Age of Onset  . Skin cancer Mother   . Diabetes  Mother   . Thyroid cancer Maternal Grandfather   . Heart attack Maternal Grandfather   . Diabetes Maternal Grandfather     Social History Social History  Substance Use Topics  . Smoking status: Current Every Day Smoker    Packs/day: 0.50    Types: Cigarettes  . Smokeless tobacco: Never Used  . Alcohol use 0.0 oz/week     Comment: ocasional     Review of Systems  Constitutional: Objective fever/chills Eyes: No visual changes. No discharge [id number] for mild nasal congestion and sore throat. Cardiovascular: no chest pain. Respiratory: Mild dry cough. No SOB. Gastrointestinal: No abdominal pain.  No nausea, no vomiting.  No diarrhea.  No constipation. Reflux symptoms. Musculoskeletal: Negative for musculoskeletal pain. Skin: Negative for rash, abrasions, lacerations, ecchymosis. Neurological: Negative for headaches, focal weakness or numbness. 10-point ROS otherwise negative.  ____________________________________________   PHYSICAL EXAM:  VITAL SIGNS: ED Triage Vitals [09/07/16 2004]  Enc Vitals Group     BP 120/78     Pulse Rate 87     Resp 18     Temp 98.4 F (36.9 C)     Temp Source Oral     SpO2      Weight 254 lb (115.2 kg)     Height  (1.676 m)     Head Circumference      Peak Flow      Pain Score 2     Pain Loc      Pain Edu?      Excl. in GC?      Constitutional: Alert and oriented. Well appearing and in no acute distress. Eyes: Conjunctivae are normal. PERRL. EOMI. Head: Atraumatic. ENT:      Ears: EACs and TMs are unremarkable bilaterally.      Nose: Mild clear congestion/rhinnorhea.      Mouth/Throat: Mucous membranes are moist. Oropharynx is mildly erythematous but not edematous. Uvula is midline. Tonsils are unremarkable bilaterally. Neck: No stridor.   Hematological/Lymphatic/Immunilogical: No cervical lymphadenopathy. Cardiovascular: Normal rate, regular rhythm. Normal S1 and S2.  Good peripheral circulation. Respiratory: Normal  respiratory effort without tachypnea or retractions. Lungs CTAB. Good air entry to the bases with no decreased or absent breath sounds. Gastrointestinal: Bowel sounds 4 quadrants. Soft and nontender to palpation. No guarding or rigidity. No palpable masses. No distention. No CVA tenderness. Musculoskeletal: Full range of motion to all extremities. No gross deformities appreciated. Neurologic:  Normal speech and language. No gross focal neurologic deficits are appreciated.  Skin:  Skin is warm, dry and intact. No rash noted. Psychiatric: Mood and affect are normal. Speech and behavior are normal. Patient exhibits appropriate insight and judgement.   ____________________________________________   LABS (all labs ordered are listed, but only abnormal results are displayed)  Labs Reviewed  CHLAMYDIA/NGC RT PCR [facility] ONLY)   ____________________________________________  EKG   ____________________________________________  RADIOLOGY   No results found.  ____________________________________________    PROCEDURES  Procedure(s) performed:    Procedures    Medications  lidocaine (XYLOCAINE) 2 % viscous mouth solution 15 mL (not administered)  ibuprofen (ADVIL,MOTRIN) tablet 600 mg (not administered)  famotidine (PEPCID) tablet 20 mg (not administered)     ____________________________________________   INITIAL IMPRESSION / ASSESSMENT AND PLAN / ED COURSE  Pertinent labs & imaging results that were available during my care of the patient were reviewed by me and considered in my medical decision making (see chart for details).  Review of the [place] CSRS was performed in accordance of the [facility] prior to dispensing any controlled drugs.     Patient's diagnosis is consistent with Sore throat and GERD. Patient complains of sore throat for the past 2 days with increased GERD symptoms for the past week. Patient has been seen multiple times recently for sore throat in  conjunction with possible STDs. Gonorrhea and chlamydia swab is obtained orally which returns with negative results. Strep test is negative. Patient does report subjective fever with cough. Patient will be treated symptomatically for viral illness or sore throat associated with increased GERD symptoms. Patient will be placed on omeprazole for GERD symptoms, magic mouthwash, Bromfed, syrup. Patient to follow-up with primary care as needed. Patient is given ED precautions to return to the ED for any worsening or new symptoms.     ____________________________________________  FINAL CLINICAL IMPRESSION(S) / ED DIAGNOSES  Final diagnoses:  Sore throat  Gastroesophageal reflux disease, esophagitis presence not specified      NEW MEDICATIONS STARTED DURING THIS VISIT:  New Prescriptions   BROMPHENIRAMINE-PSEUDOEPHEDRINE-DM 30-2-10 MG/5ML SYRUP    Take 10 mLs by mouth 4 (four) times daily as needed.   MAGIC MOUTHWASH W/LIDOCAINE SOLN    Take 5 mLs by mouth 4 (four) times daily.   OMEPRAZOLE (PRILOSEC) 10 MG CAPSULE    Take 1 capsule (10 mg total) by mouth daily.        This chart was dictated using voice recognition software/Dragon. Despite best efforts to proofread, errors can occur which can change the meaning. Any change was purely unintentional.    Racheal Patches, PA-C 09/07/16 [id number]    Nita Sickle, MD 09/12/16 [phone]

## 2016-09-07 NOTE — ED Notes (Signed)

## 2016-09-07 NOTE — ED Triage Notes (Signed)
Pt has sore throat and right ear for 2 days.  Pt alert.

## 2016-09-07 NOTE — ED Notes (Signed)
POC STREP NEGATIVE 

## 2016-09-12 ENCOUNTER — Encounter: Payer: Self-pay | Admitting: *Deleted

## 2016-09-12 ENCOUNTER — Emergency Department
Admission: EM | Admit: 2016-09-12 | Discharge: 2016-09-12 | Disposition: A | Discharge status: Home / Self Care | Payer: Self-pay | Attending: Emergency Medicine | Admitting: Emergency Medicine

## 2016-09-12 DIAGNOSIS — N5089 Other specified disorders of the male genital organs: Secondary | ICD-10-CM

## 2016-09-12 DIAGNOSIS — N4889 Other specified disorders of penis: Secondary | ICD-10-CM | POA: Insufficient documentation

## 2016-09-12 DIAGNOSIS — F1290 Cannabis use, unspecified, uncomplicated: Secondary | ICD-10-CM | POA: Insufficient documentation

## 2016-09-12 DIAGNOSIS — F17210 Nicotine dependence, cigarettes, uncomplicated: Secondary | ICD-10-CM | POA: Insufficient documentation

## 2016-09-12 DIAGNOSIS — Z79899 Other long term (current) drug therapy: Secondary | ICD-10-CM | POA: Insufficient documentation

## 2016-09-12 DIAGNOSIS — J45909 Unspecified asthma, uncomplicated: Secondary | ICD-10-CM | POA: Insufficient documentation

## 2016-09-12 DIAGNOSIS — R300 Dysuria: Secondary | ICD-10-CM | POA: Insufficient documentation

## 2016-09-12 LAB — URINALYSIS, COMPLETE (UACMP) WITH MICROSCOPIC
Bilirubin Urine: NEGATIVE
GLUCOSE, UA: NEGATIVE mg/dL
HGB URINE DIPSTICK: NEGATIVE
KETONES UR: 5 mg/dL — AB
Leukocytes, UA: NEGATIVE
NITRITE: NEGATIVE
PROTEIN: 30 mg/dL — AB
Specific Gravity, Urine: 1.033 — ABNORMAL HIGH (ref 1.005–1.030)
pH: 6 (ref 5.0–8.0)

## 2016-09-12 LAB — CHLAMYDIA/NGC RT PCR (ARMC ONLY)
Chlamydia Tr: NOT DETECTED
N gonorrhoeae: NOT DETECTED

## 2016-09-12 NOTE — ED Triage Notes (Signed)
Pt has penile discharge, dysuria and red scrotum and penis.  Sx for 4-5 days.  Pt alert.

## 2016-09-12 NOTE — ED Notes (Signed)
Pt in with burning on urination and penile discharge. Also co generalized pain to scrotum states started a few days ago. Was seen here 2 weeks ago after having unprotected sex states at the time he was not having symptoms and tests were negative. Here because now he is symptomatic and wants to be retested.

## 2016-09-12 NOTE — Discharge Instructions (Signed)
Please seek medical attention for any high fevers, chest pain, shortness of breath, change in behavior, persistent vomiting, bloody stool or any other new or concerning symptoms.  

## 2016-09-12 NOTE — ED Provider Notes (Signed)
[facility] Emergency Department Provider Note   ____________________________________________   I have reviewed the triage vital signs and the nursing notes.   HISTORY  Chief Complaint Penile Discharge   History limited by: Not Limited   HPI Ronald Montgomery is a 36 y.o. male who presents to the emergency department today because of concerns penile pain and discomfort. He states it is been bad for the past few days. He is been seen in the emergency department a few times since an unprotected sexual encounter a couple of weeks ago. He has had multiple gonorrhea chlamydia is checked which were negative. He continues to have concerns. He states he did follow up with the health department for STD checks after the first emergency department visit.    Past Medical History:  Diagnosis Date  . Alcohol abuse   . Asthma   . Chlamydia   . Constipation   . Penile pain 02/03/2015   Normal exams x several   . Scabies   . Testicular/scrotal pain 02/03/2015   Normal exam and [place] x several.    . Thyroid disorder    during childhood    Patient Active Problem List   Diagnosis Date Noted  . Epiglottitis 07/07/2015  . Penile pain 02/03/2015  . Testicular/scrotal pain 02/03/2015    Past Surgical History:  Procedure Laterality Date  . ABCESS DRAINAGE     groin area  . CYST EXCISION     top of head  . DIRECT LARYNGOSCOPY  07/07/2015   Procedure: DIRECT LARYNGOSCOPY;  Surgeon: Linus Salmons, MD;  Location: [facility] ORS;  Service: ENT;;  . INCISION AND DRAINAGE ABSCESS N/A 07/07/2015   Procedure: INCISION AND DRAINAGE ABSCESS;  Surgeon: Linus Salmons, MD;  Location: [facility] ORS;  Service: ENT;  Laterality: N/A;  epiglottic abscess  . RHYTIDECTOMY NECK / CHEEK / CHIN     due to accident    Prior to Admission medications   Medication Sig Start Date End Date Taking? Authorizing Provider  albuterol (PROVENTIL HFA;VENTOLIN HFA) 108 (90 Base) MCG/ACT inhaler Inhale 2 puffs into  the lungs every 6 (six) hours as needed for wheezing or shortness of breath. Patient not taking: Reported on 07/08/2015 06/29/15   Tommi Rumps, PA-C  amoxicillin-clavulanate (AUGMENTIN) 875-125 MG tablet Take 1 tablet by mouth 2 (two) times daily. 07/11/15   Linus Salmons, MD  brompheniramine-pseudoephedrine-DM 30-2-10 MG/5ML syrup Take 10 mLs by mouth 4 (four) times daily as needed. 09/07/16   Delorise Royals Cuthriell, PA-C  diazepam (VALIUM) 5 MG tablet Take 1 tablet (5 mg total) by mouth every 8 (eight) hours as needed for anxiety. 09/02/16 09/02/17  Willy Eddy, MD  diclofenac (CATAFLAM) 50 MG tablet Take 1 tablet (50 mg total) by mouth 3 (three) times daily. 08/27/16   Delorise Royals Cuthriell, PA-C  famotidine (PEPCID) 20 MG tablet Take 1 tablet (20 mg total) by mouth 2 (two) times daily. 10/17/15 10/16/16  Ignacia Bayley, PA-C  HYDROcodone-homatropine (HYCODAN) 5-1.5 MG/5ML syrup Take 5 mLs by mouth every 6 (six) hours as needed for cough. 10/17/15   Ignacia Bayley, PA-C  lidocaine (XYLOCAINE) 2 % solution Use as directed 20 mLs in the mouth or throat as needed for mouth pain. 07/18/15   Emily Filbert, MD  magic mouthwash w/lidocaine SOLN Take 5 mLs by mouth 4 (four) times daily. 09/07/16   Delorise Royals Cuthriell, PA-C  omeprazole (PRILOSEC) 10 MG capsule Take 1 capsule (10 mg total) by mouth daily. 09/07/16   Delorise Royals  Cuthriell, PA-C  predniSONE (STERAPRED UNI-PAK 21 TAB) 10 MG (21) TBPK tablet 6 tablets on day 1, 5 tablets on day 2, 4 tablets on day 3, etc... 10/17/15   Ignacia Bayley, PA-C  triamcinolone ointment (KENALOG) 0.5 % Apply 1 application topically 2 (two) times daily. Patient not taking: Reported on 07/08/2015 11/24/14   Emily Filbert, MD    Allergies Patient has no known allergies.  Family History  Problem Relation Age of Onset  . Skin cancer Mother   . Diabetes Mother   . Thyroid cancer Maternal Grandfather   . Heart attack Maternal Grandfather   . Diabetes Maternal Grandfather      Social History Social History  Substance Use Topics  . Smoking status: Current Every Day Smoker    Packs/day: 0.50    Types: Cigarettes  . Smokeless tobacco: Never Used  . Alcohol use 0.0 oz/week     Comment: ocasional    Review of Systems  Constitutional: Negative for fever. Cardiovascular: Negative for chest pain. Respiratory: Negative for shortness of breath. Gastrointestinal: Negative for abdominal pain, vomiting and diarrhea. Genitourinary: Positive for dysuria. Musculoskeletal: Negative for back pain. Skin: Negative for rash. Neurological: Negative for headaches, focal weakness or numbness.  10-point ROS otherwise negative.  ____________________________________________   PHYSICAL EXAM:  VITAL SIGNS: ED Triage Vitals  Enc Vitals Group     BP 09/12/16 1920 (!) 150/93     Pulse Rate 09/12/16 1920 80     Resp 09/12/16 1920 18     Temp 09/12/16 1920 98.1 F (36.7 C)     Temp Source 09/12/16 1920 Oral     SpO2 09/12/16 1920 97 %     Weight 09/12/16 1921 254 lb (115.2 kg)     Height 09/12/16 1921  (1.676 m)     Head Circumference --      Peak Flow --      Pain Score 09/12/16 1920 5    Constitutional: Alert and oriented. Well appearing and in no distress. Eyes: Conjunctivae are normal. Normal extraocular movements. ENT   Head: Normocephalic and atraumatic.   Nose: No congestion/rhinnorhea.   Mouth/Throat: Mucous membranes are moist.   Neck: No stridor. Hematological/Lymphatic/Immunilogical: No cervical lymphadenopathy. Cardiovascular: Normal rate, regular rhythm.  No murmurs, rubs, or gallops. Respiratory: Normal respiratory effort without tachypnea nor retractions. Breath sounds are clear and equal bilaterally. No wheezes/rales/rhonchi. Gastrointestinal: Soft and non tender. No rebound. No guarding.  Genitourinary: Uncircumcised penis. Bilateral descended testes. No skin changes. No ulcers or lesions appreciated. No tenderness to  palpation of penis or testes.  Musculoskeletal: Normal range of motion in all extremities. No lower extremity edema. Neurologic:  Normal speech and language. No gross focal neurologic deficits are appreciated.  Skin:  Skin is warm, dry and intact. No rash noted. Psychiatric: Mood and affect are normal. Speech and behavior are normal. Patient exhibits appropriate insight and judgment.  ____________________________________________    LABS (pertinent positives/negatives)  Labs Reviewed  URINALYSIS, COMPLETE (UACMP) WITH MICROSCOPIC - Abnormal; Notable for the following:       Result Value   Color, Urine AMBER (*)    APPearance HAZY (*)    Specific Gravity, Urine 1.033 (*)    Ketones, ur 5 (*)    Protein, ur 30 (*)    Bacteria, UA RARE (*)    Squamous Epithelial / LPF 0-5 (*)    All other components within normal limits  CHLAMYDIA/NGC RT PCR ([facility] ONLY)     ____________________________________________  EKG  None  ____________________________________________    RADIOLOGY  None  ____________________________________________   PROCEDURES  Procedures  ____________________________________________   INITIAL IMPRESSION / ASSESSMENT AND PLAN / ED COURSE  Pertinent labs & imaging results that were available during my care of the patient were reviewed by me and considered in my medical decision making (see chart for details).  Patient presented to the emergency department today with continued concerns for genital discomfort after unprotected sex and number weeks ago. Sunday negative workup. Emergency department states that he has followed up with the health Department. No concerning findings on physical exam today. While patient follow-up with health Department.  ____________________________________________   FINAL CLINICAL IMPRESSION(S) / ED DIAGNOSES  Final diagnoses:  Pain of male genitalia     Note: This dictation was prepared with Dragon dictation.  Any transcriptional errors that result from this process are unintentional     Phineas Semen, MD 09/12/16 2013

## 2016-09-14 ENCOUNTER — Encounter: Payer: Self-pay | Admitting: Urology

## 2016-09-14 ENCOUNTER — Ambulatory Visit (INDEPENDENT_AMBULATORY_CARE_PROVIDER_SITE_OTHER): Payer: Self-pay | Admitting: Urology

## 2016-09-14 VITALS — BP 147/82 | HR 91 | Ht 66.0 in | Wt 254.0 lb

## 2016-09-14 DIAGNOSIS — R102 Pelvic and perineal pain: Secondary | ICD-10-CM

## 2016-09-14 LAB — URINALYSIS, COMPLETE
Bilirubin, UA: NEGATIVE
Ketones, UA: NEGATIVE
LEUKOCYTES UA: NEGATIVE
NITRITE UA: NEGATIVE
PH UA: 7.5 (ref 5.0–7.5)
RBC, UA: NEGATIVE
Specific Gravity, UA: 1.02 (ref 1.005–1.030)
UUROB: 1 mg/dL (ref 0.2–1.0)

## 2016-09-14 NOTE — Progress Notes (Signed)
09/14/2016 11:09 AM   Ronald Montgomery 1981-04-09 [id number]  Referring provider: No referring provider defined for this encounter.  Chief Complaint  Patient presents with  . Groin Pain    ER follow up    HPI: The patient is a 36 year old gentleman presents today for follow-up of penile scrotal pain after being seen in the ED.  1. Chronic penoscrotal pain He has been seen multiple times for this condition in the ED recently as well as in our office in 2016. In the emergency department where he has been seen at least 4 times in the last few weeks his main complaint was penile pain and discomfort. He had multiple gonorrhea and Chlamydia checks which were negative. However he continues to have concerns.   He complains mostly of pain in his lower scrotum and thighs. He also has pain that radiates the tip of his penis. Sitting makes it worse. Walking makes it better. He also notes back and flank pain that is intermittent. He rates the pain as dull 4 out of 10 pain. His no dysuria. He has no problems obtaining an erection that he has positional pain with intercourse and occasionally. This is similar to pain he had many years ago and seen 2016.  He was last seen in our office in September 2016 where all exams and pertinent imaging were unremarkable. This included multiple scrotal ultrasounds in 2013 and 2016. Thought that time that this was a manifestation of a chronic pain syndrome spectrum disorder or musculoskeletal strain. He was recommended NSAIDs and staying active and continue stretching.  PMH: Past Medical History:  Diagnosis Date  . Alcohol abuse   . Asthma   . Chlamydia   . Constipation   . Penile pain 02/03/2015   Normal exams x several   . Scabies   . Testicular/scrotal pain 02/03/2015   Normal exam and [place] x several.    . Thyroid disorder    during childhood    Surgical History: Past Surgical History:  Procedure Laterality Date  . ABCESS DRAINAGE     groin area  . CYST  EXCISION     top of head  . DIRECT LARYNGOSCOPY  07/07/2015   Procedure: DIRECT LARYNGOSCOPY;  Surgeon: Linus Salmons, MD;  Location: [facility] ORS;  Service: ENT;;  . INCISION AND DRAINAGE ABSCESS N/A 07/07/2015   Procedure: INCISION AND DRAINAGE ABSCESS;  Surgeon: Linus Salmons, MD;  Location: [facility] ORS;  Service: ENT;  Laterality: N/A;  epiglottic abscess  . RHYTIDECTOMY NECK / CHEEK / CHIN     due to accident    Home Medications:  Allergies as of 09/14/2016   No Known Allergies     Medication List       Accurate as of 09/14/16 11:09 AM. Always use your most recent med list.          albuterol 108 (90 Base) MCG/ACT inhaler Commonly known as:  PROVENTIL HFA;VENTOLIN HFA Inhale 2 puffs into the lungs every 6 (six) hours as needed for wheezing or shortness of breath.   omeprazole 10 MG capsule Commonly known as:  PRILOSEC Take 1 capsule (10 mg total) by mouth daily.       Allergies: No Known Allergies  Family History: Family History  Problem Relation Age of Onset  . Skin cancer Mother   . Diabetes Mother   . Thyroid cancer Maternal Grandfather   . Heart attack Maternal Grandfather   . Diabetes Maternal Grandfather     Social History:  reports  that he has been smoking Cigarettes.  He has been smoking about 0.50 packs per day. He has never used smokeless tobacco. He reports that he drinks alcohol. He reports that he uses drugs, including Marijuana.  ROS: UROLOGY Frequent Urination?: No Hard to postpone urination?: No Burning/pain with urination?: No Get up at night to urinate?: No Leakage of urine?: No Urine stream starts and stops?: No Trouble starting stream?: No Do you have to strain to urinate?: No Blood in urine?: No Urinary tract infection?: Yes Sexually transmitted disease?: Yes Injury to kidneys or bladder?: No Painful intercourse?: Yes Weak stream?: No Erection problems?: No Penile pain?: Yes  Gastrointestinal Nausea?: No Vomiting?:  No Indigestion/heartburn?: No Diarrhea?: No Constipation?: No  Constitutional Fever: Yes Night sweats?: No Weight loss?: No Fatigue?: Yes  Skin Skin rash/lesions?: No Itching?: No  Eyes Blurred vision?: No Double vision?: No  Ears/Nose/Throat Sore throat?: No Sinus problems?: No  Hematologic/Lymphatic Swollen glands?: No Easy bruising?: No  Cardiovascular Leg swelling?: No Chest pain?: No  Respiratory Cough?: No Shortness of breath?: No  Endocrine Excessive thirst?: No  Musculoskeletal Back pain?: No Joint pain?: No  Neurological Headaches?: No Dizziness?: No  Psychologic Depression?: No Anxiety?: No  Physical Exam: BP (!) 147/82   Pulse 91   Ht  (1.676 m)   Wt 254 lb (115.2 kg)   BMI 41.00 kg/m   Constitutional:  Alert and oriented, No acute distress. HEENT: [place] AT, moist mucus membranes.  Trachea midline, no masses. Cardiovascular: No clubbing, cyanosis, or edema. Respiratory: Normal respiratory effort, no increased work of breathing. GI: Abdomen is soft, nontender, nondistended, no abdominal masses GU: No CVA tenderness. Normal uncircumcised phallus. Testicles descended bilaterally. No masses nontender palpation. No inguinal hernias noted. Perineum nontender palpation. Skin: No rashes, bruises or suspicious lesions. Lymph: No cervical or inguinal adenopathy. Neurologic: Grossly intact, no focal deficits, moving all 4 extremities. Psychiatric: Normal mood and affect.  Laboratory Data: Lab Results  Component Value Date   WBC 12.4 (H) 07/18/2015   HGB 14.5 07/18/2015   HCT 42.9 07/18/2015   MCV 91.5 07/18/2015   PLT 166 07/18/2015    Lab Results  Component Value Date   CREATININE 0.84 07/18/2015    No results found for: PSA  No results found for: TESTOSTERONE  No results found for: HGBA1C  Urinalysis    Component Value Date/Time   COLORURINE AMBER (A) 09/12/2016 1946   APPEARANCEUR HAZY (A) 09/12/2016 1946    APPEARANCEUR Clear 12/18/2014 1513   LABSPEC 1.033 (H) 09/12/2016 1946   LABSPEC 1.026 07/05/2014 1536   PHURINE 6.0 09/12/2016 1946   GLUCOSEU NEGATIVE 09/12/2016 1946   GLUCOSEU Negative 07/05/2014 1536   HGBUR NEGATIVE 09/12/2016 1946   BILIRUBINUR NEGATIVE 09/12/2016 1946   BILIRUBINUR Negative 12/18/2014 1513   BILIRUBINUR Negative 07/05/2014 1536   KETONESUR 5 (A) 09/12/2016 1946   PROTEINUR 30 (A) 09/12/2016 1946   NITRITE NEGATIVE 09/12/2016 1946   LEUKOCYTESUR NEGATIVE 09/12/2016 1946   LEUKOCYTESUR Negative 12/18/2014 1513   LEUKOCYTESUR Negative 07/05/2014 1536     Assessment & Plan:    1. Chronic pelvic pain and I discussed the patient that all laboratories have been negative for infection. His pain is more consistent with musculoskeletal in nature and not infectious etiology. I do not think he has infectious problem at this time. I recommended ibuprofen 600 mg 3 times a day for the next few weeks. We'll also arrange for him undergo pelvic floor physical therapy as this will help  with his musculoskeletal base problem. I reassured the patient that there is no underlying infection or problem as the result of his symptoms. He'll follow-up with [place] as needed.  Return if symptoms worsen or fail to improve.  Hildred Laser, MD  [facility] [address] [place], [place] [address] [phone]

## 2017-01-07 ENCOUNTER — Emergency Department (HOSPITAL_COMMUNITY)
Admission: EM | Admit: 2017-01-07 | Discharge: 2017-01-07 | Disposition: A | Discharge status: Home / Self Care | Payer: Self-pay | Attending: Emergency Medicine | Admitting: Emergency Medicine

## 2017-01-07 ENCOUNTER — Encounter (HOSPITAL_COMMUNITY): Payer: Self-pay | Admitting: Emergency Medicine

## 2017-01-07 DIAGNOSIS — Z79899 Other long term (current) drug therapy: Secondary | ICD-10-CM | POA: Insufficient documentation

## 2017-01-07 DIAGNOSIS — N342 Other urethritis: Secondary | ICD-10-CM | POA: Insufficient documentation

## 2017-01-07 DIAGNOSIS — F17210 Nicotine dependence, cigarettes, uncomplicated: Secondary | ICD-10-CM | POA: Insufficient documentation

## 2017-01-07 DIAGNOSIS — J45909 Unspecified asthma, uncomplicated: Secondary | ICD-10-CM | POA: Insufficient documentation

## 2017-01-07 LAB — URINALYSIS, ROUTINE W REFLEX MICROSCOPIC
BILIRUBIN URINE: NEGATIVE
Glucose, UA: NEGATIVE mg/dL
Hgb urine dipstick: NEGATIVE
KETONES UR: NEGATIVE mg/dL
LEUKOCYTES UA: NEGATIVE
NITRITE: NEGATIVE
Protein, ur: NEGATIVE mg/dL
SPECIFIC GRAVITY, URINE: 1.02 (ref 1.005–1.030)
pH: 5 (ref 5.0–8.0)

## 2017-01-07 MED ORDER — LIDOCAINE HCL (PF) 1 % IJ SOLN
INTRAMUSCULAR | Status: AC
  Administered 2017-01-07: 5 mL
  Filled 2017-01-07: qty 5

## 2017-01-07 MED ORDER — BACITRACIN ZINC 500 UNIT/GM EX OINT: 1.0000 "application " | TOPICAL_OINTMENT | Freq: Two times a day (BID) | CUTANEOUS | 0 refills | Status: DC

## 2017-01-07 MED ORDER — DOXYCYCLINE HYCLATE 100 MG PO CAPS: 100.0000 mg | ORAL_CAPSULE | Freq: Two times a day (BID) | ORAL | 0 refills | Status: DC

## 2017-01-07 MED ORDER — CEFTRIAXONE SODIUM 250 MG IJ SOLR
250.0000 mg | Freq: Once | INTRAMUSCULAR | Status: AC
  Administered 2017-01-07: 250 mg via INTRAMUSCULAR
  Filled 2017-01-07: qty 250

## 2017-01-07 NOTE — Discharge Instructions (Signed)

## 2017-01-07 NOTE — ED Triage Notes (Signed)
Pt reports pain and redness at urethra. Pt also reports dysuria and unprotected sex last week. Pt denies any known fevers.

## 2017-01-07 NOTE — ED Provider Notes (Signed)
[facility]-EMER[facility]ENCY DEPT Provider Note   CSN: [id number] Arrival date & time: 01/07/17  1516     History   Chief Complaint Chief Complaint  Patient presents with  . Dysuria    HPI Ronald Montgomery is a 36 y.o. male.  HPI  The patient is a 36 year old male who reports that he has had some dysuria with a burning sensation around the tip of his penis for the last week, gradually worsening, not associated with drainage discharge or bleeding. He does report an unprotected sexual encounter approximately one week ago. He has also been using a new body soap that was left at his house by some relatives. He denies having any change in the color of the skin around the penis, no testicular pain, no gastrointestinal symptoms.  Past Medical History:  Diagnosis Date  . Alcohol abuse   . Asthma   . Chlamydia   . Constipation   . Penile pain 02/03/2015   Normal exams x several   . Scabies   . Testicular/scrotal pain 02/03/2015   Normal exam and [place] x several.    . Thyroid disorder    during childhood    Patient Active Problem List   Diagnosis Date Noted  . Epiglottitis 07/07/2015  . Penile pain 02/03/2015  . Testicular/scrotal pain 02/03/2015    Past Surgical History:  Procedure Laterality Date  . ABCESS DRAINAGE     groin area  . CYST EXCISION     top of head  . DIRECT LARYNGOSCOPY  07/07/2015   Procedure: DIRECT LARYNGOSCOPY;  Surgeon: Linus Salmons, MD;  Location: [facility] ORS;  Service: ENT;;  . INCISION AND DRAINAGE ABSCESS N/A 07/07/2015   Procedure: INCISION AND DRAINAGE ABSCESS;  Surgeon: Linus Salmons, MD;  Location: [facility] ORS;  Service: ENT;  Laterality: N/A;  epiglottic abscess  . RHYTIDECTOMY NECK / CHEEK / CHIN     due to accident       Home Medications    Prior to Admission medications   Medication Sig Start Date End Date Taking? Authorizing Provider  albuterol (PROVENTIL HFA;VENTOLIN HFA) 108 (90 Base) MCG/ACT inhaler Inhale 2 puffs into the lungs every 6 (six) hours as  needed for wheezing or shortness of breath. Patient not taking: Reported on 07/08/2015 06/29/15   Bridget Hartshorn L, PA-C  bacitracin ointment Apply 1 application topically 2 (two) times daily. 01/07/17   Eber Hong, MD  doxycycline (VIBRAMYCIN) 100 MG capsule Take 1 capsule (100 mg total) by mouth 2 (two) times daily. 01/07/17   Eber Hong, MD  omeprazole (PRILOSEC) 10 MG capsule Take 1 capsule (10 mg total) by mouth daily. 09/07/16   Cuthriell, Delorise Royals, PA-C    Family History Family History  Problem Relation Age of Onset  . Skin cancer Mother   . Diabetes Mother   . Thyroid cancer Maternal Grandfather   . Heart attack Maternal Grandfather   . Diabetes Maternal Grandfather     Social History Social History  Substance Use Topics  . Smoking status: Current Every Day Smoker    Packs/day: 0.50    Types: Cigarettes  . Smokeless tobacco: Never Used  . Alcohol use 0.0 oz/week     Comment: occasional     Allergies   Patient has no known allergies.   Review of Systems Review of Systems  Constitutional: Negative for fever.  Genitourinary: Positive for dysuria. Negative for discharge, genital sores, hematuria, penile swelling, scrotal swelling, testicular pain and urgency.  Musculoskeletal: Negative for back pain.  Physical Exam Updated Vital Signs BP (!) 151/84 (BP Location: Right Arm)   Pulse 85   Temp 98.6 F (37 C) (Oral)   Resp 18   Ht 5\' 6"  (1.676 m)   Wt 113.4 kg (250 lb)   SpO2 100%   BMI 40.35 kg/m   Physical Exam  Constitutional: He appears well-developed and well-nourished.  HENT:  Head: Normocephalic and atraumatic.  Eyes: Conjunctivae are normal. Right eye exhibits no discharge. Left eye exhibits no discharge.  Pulmonary/Chest: Effort normal. No respiratory distress.  Genitourinary:  Genitourinary Comments: Normal appearing uncircumcised penis, normal scrotum and testicles, no tenderness or masses, no inguinal hernias palpated, normal-appearing  urethral meatus without discharge drainage redness of bleeding or any other abnormality.  Neurological: He is alert. Coordination normal.  Skin: Skin is warm and dry. No rash noted. He is not diaphoretic. No erythema.  Psychiatric: He has a normal mood and affect.  Nursing note and vitals reviewed.    ED Treatments / Results  Labs (all labs ordered are listed, but only abnormal results are displayed) Labs Reviewed  URINALYSIS, ROUTINE W REFLEX MICROSCOPIC     Radiology No results found.  Procedures Procedures (including critical care time)  Medications Ordered in ED Medications  cefTRIAXone (ROCEPHIN) injection 250 mg (not administered)     Initial Impression / Assessment and Plan / ED Course  I have reviewed the triage vital signs and the nursing notes.  Pertinent labs & imaging results that were available during my care of the patient were reviewed by me and considered in my medical decision making (see chart for details).     Dysuria after unprotected sexual encounter, treated for STDs, topical bacitracin Wandra Scotglesias developing some early balanitis though there does not appear to be any signs of this clinically. Stable for discharge, counseled patient on appropriate barrier contraceptive use. Patient expressed his understanding.  Final Clinical Impressions(s) / ED Diagnoses   Final diagnoses:  Urethritis    New Prescriptions New Prescriptions   BACITRACIN OINTMENT    Apply 1 application topically 2 (two) times daily.   DOXYCYCLINE (VIBRAMYCIN) 100 MG CAPSULE    Take 1 capsule (100 mg total) by mouth 2 (two) times daily.     Eber HongMiller, Kaytie Ratcliffe, MD 01/07/17 [phone]

## 2017-04-22 ENCOUNTER — Emergency Department
Admission: EM | Admit: 2017-04-22 | Discharge: 2017-04-22 | Disposition: A | Discharge status: Home / Self Care | Payer: Self-pay | Attending: Emergency Medicine | Admitting: Emergency Medicine

## 2017-04-22 ENCOUNTER — Other Ambulatory Visit: Payer: Self-pay

## 2017-04-22 ENCOUNTER — Encounter: Payer: Self-pay | Admitting: Emergency Medicine

## 2017-04-22 DIAGNOSIS — J45909 Unspecified asthma, uncomplicated: Secondary | ICD-10-CM | POA: Insufficient documentation

## 2017-04-22 DIAGNOSIS — Z79899 Other long term (current) drug therapy: Secondary | ICD-10-CM | POA: Insufficient documentation

## 2017-04-22 DIAGNOSIS — H6991 Unspecified Eustachian tube disorder, right ear: Secondary | ICD-10-CM | POA: Insufficient documentation

## 2017-04-22 DIAGNOSIS — F17210 Nicotine dependence, cigarettes, uncomplicated: Secondary | ICD-10-CM | POA: Insufficient documentation

## 2017-04-22 MED ORDER — CETIRIZINE HCL 10 MG PO TABS: 10.0000 mg | ORAL_TABLET | Freq: Every day | ORAL | 0 refills | Status: DC

## 2017-04-22 MED ORDER — PREDNISONE 10 MG PO TABS: ORAL_TABLET | ORAL | 0 refills | Status: DC

## 2017-04-22 NOTE — ED Triage Notes (Signed)
Patient to ER for c/o right sided ear pain with itching.

## 2017-04-22 NOTE — ED Provider Notes (Signed)
[facility] Emergency Department Provider Note  ____________________________________________  Time seen: Approximately 9:47 AM  I have reviewed the triage vital signs and the nursing notes.   HISTORY  Chief Complaint Otalgia    HPI Ronald Montgomery is a 36 y.o. male that presents emergency department for evaluation of right ear pressure for 2 weeks.  Patient states that it feels like his ear is under water.  There is no pain.  He is concerned for wax buildup in his ear.  He keeps trying to "dig" in his ear without relief.  No recent illness.  No fever, chills, nasal congestion, cough, dental pain, shortness of breath, chest pain, nausea, vomiting, abdominal pain.  Past Medical History:  Diagnosis Date  . Alcohol abuse   . Asthma   . Chlamydia   . Constipation   . Penile pain 02/03/2015   Normal exams x several   . Scabies   . Testicular/scrotal pain 02/03/2015   Normal exam and [place] x several.    . Thyroid disorder    during childhood    Patient Active Problem List   Diagnosis Date Noted  . Epiglottitis 07/07/2015  . Penile pain 02/03/2015  . Testicular/scrotal pain 02/03/2015    Past Surgical History:  Procedure Laterality Date  . ABCESS DRAINAGE     groin area  . CYST EXCISION     top of head  . DIRECT LARYNGOSCOPY  07/07/2015   Procedure: DIRECT LARYNGOSCOPY;  Surgeon: Linus Salmons, MD;  Location: [facility] ORS;  Service: ENT;;  . INCISION AND DRAINAGE ABSCESS N/A 07/07/2015   Procedure: INCISION AND DRAINAGE ABSCESS;  Surgeon: Linus Salmons, MD;  Location: [facility] ORS;  Service: ENT;  Laterality: N/A;  epiglottic abscess  . RHYTIDECTOMY NECK / CHEEK / CHIN     due to accident    Prior to Admission medications   Medication Sig Start Date End Date Taking? Authorizing Provider  albuterol (PROVENTIL HFA;VENTOLIN HFA) 108 (90 Base) MCG/ACT inhaler Inhale 2 puffs into the lungs every 6 (six) hours as needed for wheezing or shortness of breath. Patient  not taking: Reported on 07/08/2015 06/29/15   Bridget Hartshorn L, PA-C  bacitracin ointment Apply 1 application topically 2 (two) times daily. 01/07/17   Eber Hong, MD  cetirizine (ZYRTEC ALLERGY) 10 MG tablet Take 1 tablet (10 mg total) by mouth daily. 04/22/17   Enid Derry, PA-C  doxycycline (VIBRAMYCIN) 100 MG capsule Take 1 capsule (100 mg total) by mouth 2 (two) times daily. 01/07/17   Eber Hong, MD  omeprazole (PRILOSEC) 10 MG capsule Take 1 capsule (10 mg total) by mouth daily. 09/07/16   Cuthriell, Delorise Royals, PA-C  predniSONE (DELTASONE) 10 MG tablet Take 6 tablets on day 1, take 5 tablets on day 2, take 4 tablets on day 3, take 3 tablets on day 4, take 2 tablets on day 5, take 1 tablet on day 6 04/22/17   Enid Derry, PA-C    Allergies Patient has no known allergies.  Family History  Problem Relation Age of Onset  . Skin cancer Mother   . Diabetes Mother   . Thyroid cancer Maternal Grandfather   . Heart attack Maternal Grandfather   . Diabetes Maternal Grandfather     Social History Social History   Tobacco Use  . Smoking status: Current Every Day Smoker    Packs/day: 0.50    Types: Cigarettes  . Smokeless tobacco: Never Used  Substance Use Topics  . Alcohol use: Yes  Alcohol/week: 0.0 oz    Comment: occasional  . Drug use: No     Review of Systems  Constitutional: No fever/chills Cardiovascular: No chest pain. Respiratory: No SOB. Gastrointestinal: No abdominal pain.  No nausea, no vomiting.  Musculoskeletal: Negative for musculoskeletal pain. Skin: Negative for rash, abrasions, lacerations, ecchymosis. Neurological: Negative for headaches, numbness or tingling   ____________________________________________   PHYSICAL EXAM:  VITAL SIGNS: ED Triage Vitals  Enc Vitals Group     BP 04/22/17 0908 (!) 134/96     Pulse Rate 04/22/17 0908 (!) 109     Resp 04/22/17 0908 20     Temp 04/22/17 0908 98.4 F (36.9 C)     Temp Source 04/22/17 0908  Oral     SpO2 04/22/17 0908 99 %     Weight 04/22/17 0908 264 lb (119.7 kg)     Height 04/22/17 0908 5\' 6"  (1.676 m)     Head Circumference --      Peak Flow --      Pain Score 04/22/17 0907 2     Pain Loc --      Pain Edu? --      Excl. in GC? --      Constitutional: Alert and oriented. Well appearing and in no acute distress. Eyes: Conjunctivae are normal. PERRL. EOMI. Head: Atraumatic. ENT:      Ears: Tympanic membranes pearly with good landmarks.  No tenderness to palpation of pinna or tragus.  No cerumen in ear canal.  No drainage.      Nose: No congestion/rhinnorhea.      Mouth/Throat: Mucous membranes are moist.  Neck: No stridor.  No cervical lymphadenopathy. Cardiovascular: Normal rate, regular rhythm.  Good peripheral circulation. Respiratory: Normal respiratory effort without tachypnea or retractions. Lungs CTAB. Good air entry to the bases with no decreased or absent breath sounds. Musculoskeletal: Full range of motion to all extremities. No gross deformities appreciated. Neurologic:  Normal speech and language. No gross focal neurologic deficits are appreciated.  Skin:  Skin is warm, dry and intact. No rash noted.   ____________________________________________   LABS (all labs ordered are listed, but only abnormal results are displayed)  Labs Reviewed - No data to display ____________________________________________  EKG   ____________________________________________  RADIOLOGY   No results found.  ____________________________________________    PROCEDURES  Procedure(s) performed:    Procedures    Medications - No data to display   ____________________________________________   INITIAL IMPRESSION / ASSESSMENT AND PLAN / ED COURSE  Pertinent labs & imaging results that were available during my care of the patient were reviewed by me and considered in my medical decision making (see chart for details).  Review of the [place] CSRS was  performed in accordance of the [facility] prior to dispensing any controlled drugs.   Patient's diagnosis is consistent with eustachian tube dysfunction.  Vital signs and exam are reassuring. No signs of ear infection.  Patient will be discharged home with prescriptions for prednisone and Zyrtec. Patient is to follow up with PCP as directed. Patient is given ED precautions to return to the ED for any worsening or new symptoms.     ____________________________________________  FINAL CLINICAL IMPRESSION(S) / ED DIAGNOSES  Final diagnoses:  Disorder of right eustachian tube         This chart was dictated using voice recognition software/Dragon. Despite best efforts to proofread, errors can occur which can change the meaning. Any change was purely unintentional.    Enid DerryWagner, Osmara Drummonds, PA-C 04/22/17  1105    Minna AntisPaduchowski, Kevin, MD 04/22/17 1441

## 2017-05-24 ENCOUNTER — Emergency Department
Admission: EM | Admit: 2017-05-24 | Discharge: 2017-05-24 | Disposition: A | Discharge status: Home / Self Care | Payer: Self-pay | Attending: Emergency Medicine | Admitting: Emergency Medicine

## 2017-05-24 ENCOUNTER — Other Ambulatory Visit: Payer: Self-pay

## 2017-05-24 ENCOUNTER — Encounter: Payer: Self-pay | Admitting: Emergency Medicine

## 2017-05-24 DIAGNOSIS — J45909 Unspecified asthma, uncomplicated: Secondary | ICD-10-CM | POA: Insufficient documentation

## 2017-05-24 DIAGNOSIS — Z79899 Other long term (current) drug therapy: Secondary | ICD-10-CM | POA: Insufficient documentation

## 2017-05-24 DIAGNOSIS — J029 Acute pharyngitis, unspecified: Secondary | ICD-10-CM | POA: Insufficient documentation

## 2017-05-24 DIAGNOSIS — F17210 Nicotine dependence, cigarettes, uncomplicated: Secondary | ICD-10-CM | POA: Insufficient documentation

## 2017-05-24 LAB — GROUP A STREP BY PCR: GROUP A STREP BY PCR: NOT DETECTED

## 2017-05-24 MED ORDER — AZITHROMYCIN 1 G PO PACK
1.0000 g | PACK | Freq: Once | ORAL | Status: AC
  Administered 2017-05-24: 1 g via ORAL
  Filled 2017-05-24: qty 1

## 2017-05-24 MED ORDER — DEXAMETHASONE SODIUM PHOSPHATE 10 MG/ML IJ SOLN
10.0000 mg | Freq: Once | INTRAMUSCULAR | Status: AC
  Administered 2017-05-24: 10 mg via INTRAMUSCULAR
  Filled 2017-05-24: qty 1

## 2017-05-24 MED ORDER — AMOXICILLIN 500 MG PO CAPS: 500.0000 mg | ORAL_CAPSULE | Freq: Three times a day (TID) | ORAL | 0 refills | Status: DC

## 2017-05-24 MED ORDER — AZITHROMYCIN 1 G PO PACK
PACK | ORAL | Status: AC
  Administered 2017-05-24: 1 g via ORAL
  Filled 2017-05-24: qty 1

## 2017-05-24 MED ORDER — CEFTRIAXONE SODIUM 250 MG IJ SOLR
250.0000 mg | Freq: Once | INTRAMUSCULAR | Status: AC
  Administered 2017-05-24: 250 mg via INTRAMUSCULAR
  Filled 2017-05-24: qty 250

## 2017-05-24 MED ORDER — MAGIC MOUTHWASH: 5.0000 mL | Freq: Three times a day (TID) | ORAL | 0 refills | Status: DC | PRN

## 2017-05-24 MED ORDER — MAGIC MOUTHWASH
ORAL | Status: AC
  Administered 2017-05-24: 10 mL via ORAL
  Filled 2017-05-24: qty 10

## 2017-05-24 MED ORDER — MAGIC MOUTHWASH
10.0000 mL | Freq: Once | ORAL | Status: AC
  Administered 2017-05-24: 10 mL via ORAL
  Filled 2017-05-24: qty 10

## 2017-05-24 NOTE — Discharge Instructions (Signed)
1.  Take antibiotic as prescribed (amoxicillin 500 mg 3 times daily for 7 days). 2.  You may take Magic mouthwash as needed for throat discomfort. 3.  You have been treated with antibiotics for possible STDs. 4.  Return to the ER for worsening symptoms, persistent vomiting, difficulty breathing or other concerns.

## 2017-05-24 NOTE — ED Provider Notes (Signed)
[facility] Emergency Department Provider Note   ____________________________________________   First MD Initiated Contact with Patient 05/24/17 [phone]     (approximate)  I have reviewed the triage vital signs and the nursing notes.   HISTORY  Chief Complaint Sore Throat    HPI Ronald Montgomery is a 36 y.o. male who presents to the ED from home with a chief complaint of sore throat.  Complains of painful throat without associated difficulty breathing.  Denies associated fever, chills, chest pain, shortness of breath, abdominal pain, nausea or vomiting.  The patient does note that he has a new sexual partner and took part and oral sexual intercourse several days ago.  He denies penile discharge.  Would like to be treated for STDs given his past history of chlamydia.  Denies recent travel or trauma.   Past Medical History:  Diagnosis Date  . Alcohol abuse   . Asthma   . Chlamydia   . Constipation   . Penile pain 02/03/2015   Normal exams x several   . Scabies   . Testicular/scrotal pain 02/03/2015   Normal exam and US x several.    . Thyroid disorder    during childhood    Patient Active Problem List   Diagnosis Date Noted  . Epiglottitis 07/07/2015  . Penile pain 02/03/2015  . Testicular/scrotal pain 02/03/2015    Past Surgical History:  Procedure Laterality Date  . ABCESS DRAINAGE     groin area  . CYST EXCISION     top of head  . DIRECT LARYNGOSCOPY  07/07/2015   Procedure: DIRECT LARYNGOSCOPY;  Surgeon: Linus Salmonshapman McQueen, MD;  Location: [facility] ORS;  Service: ENT;;  . INCISION AND DRAINAGE ABSCESS N/A 07/07/2015   Procedure: INCISION AND DRAINAGE ABSCESS;  Surgeon: Linus Salmonshapman McQueen, MD;  Location: [facility] ORS;  Service: ENT;  Laterality: N/A;  epiglottic abscess  . RHYTIDECTOMY NECK / CHEEK / CHIN     due to accident    Prior to Admission medications   Medication Sig Start Date End Date Taking? Authorizing Provider  albuterol (PROVENTIL HFA;VENTOLIN  HFA) 108 (90 Base) MCG/ACT inhaler Inhale 2 puffs into the lungs every 6 (six) hours as needed for wheezing or shortness of breath. Patient not taking: Reported on 07/08/2015 06/29/15   Tommi RumpsSummers, Rhonda L, PA-C  amoxicillin (AMOXIL) 500 MG capsule Take 1 capsule (500 mg total) by mouth 3 (three) times daily. 05/24/17   Irean HongSung, Katelen Luepke J, MD  bacitracin ointment Apply 1 application topically 2 (two) times daily. 01/07/17   Eber HongMiller, Brian, MD  cetirizine (ZYRTEC ALLERGY) 10 MG tablet Take 1 tablet (10 mg total) by mouth daily. 04/22/17   Enid DerryWagner, Ashley, PA-C  doxycycline (VIBRAMYCIN) 100 MG capsule Take 1 capsule (100 mg total) by mouth 2 (two) times daily. 01/07/17   Eber HongMiller, Brian, MD  magic mouthwash SOLN Take 5 mLs by mouth 3 (three) times daily as needed for mouth pain. 05/24/17   Irean HongSung, Aamira Bischoff J, MD  omeprazole (PRILOSEC) 10 MG capsule Take 1 capsule (10 mg total) by mouth daily. 09/07/16   Cuthriell, Delorise RoyalsJonathan D, PA-C  predniSONE (DELTASONE) 10 MG tablet Take 6 tablets on day 1, take 5 tablets on day 2, take 4 tablets on day 3, take 3 tablets on day 4, take 2 tablets on day 5, take 1 tablet on day 6 04/22/17   Enid DerryWagner, Ashley, PA-C    Allergies Patient has no known allergies.  Family History  Problem Relation Age of Onset  .  Skin cancer Mother   . Diabetes Mother   . Thyroid cancer Maternal Grandfather   . Heart attack Maternal Grandfather   . Diabetes Maternal Grandfather     Social History Social History   Tobacco Use  . Smoking status: Current Every Day Smoker    Packs/day: 0.50    Types: Cigarettes  . Smokeless tobacco: Never Used  Substance Use Topics  . Alcohol use: Yes    Alcohol/week: 0.0 oz    Comment: occasional  . Drug use: No    Review of Systems   Constitutional: No fever/chills. Eyes: No visual changes. ENT: Positive for sore throat. Cardiovascular: Denies chest pain. Respiratory: Denies shortness of breath. Gastrointestinal: No abdominal pain.  No nausea, no vomiting.   No diarrhea.  No constipation. Genitourinary: Negative for dysuria. Musculoskeletal: Negative for back pain. Skin: Negative for rash. Neurological: Negative for headaches, focal weakness or numbness.   ____________________________________________   PHYSICAL EXAM:  VITAL SIGNS: ED Triage Vitals [05/24/17 0350]  Enc Vitals Group     BP 122/82     Pulse Rate 82     Resp 20     Temp 98 F (36.7 C)     Temp Source Oral     SpO2 98 %     Weight 250 lb (113.4 kg)     Height 5\' 6"  (1.676 m)     Head Circumference      Peak Flow      Pain Score 5     Pain Loc      Pain Edu?      Excl. in GC?     Constitutional: Alert and oriented. Well appearing and in no acute distress. Eyes: Conjunctivae are normal. PERRL. EOMI. Head: Atraumatic. Nose: No congestion/rhinnorhea. Mouth/Throat: Mucous membranes are moist.  Oropharynx erythematous with mild tonsillar swelling bilaterally.  No tonsillar exudates or peritonsillar abscess.  There is no hoarse or muffled voice.  There is no drooling. Neck: No stridor.  Supple neck without meningismus. Hematological/Lymphatic/Immunilogical: No cervical lymphadenopathy. Cardiovascular: Normal rate, regular rhythm. Grossly normal heart sounds.  Good peripheral circulation. Respiratory: Normal respiratory effort.  No retractions. Lungs CTAB. Gastrointestinal: Soft and nontender. No distention. No abdominal bruits. No CVA tenderness. Musculoskeletal: No lower extremity tenderness nor edema.  No joint effusions. Neurologic:  Normal speech and language. No gross focal neurologic deficits are appreciated. No gait instability. Skin:  Skin is warm, dry and intact. No rash noted. Psychiatric: Mood and affect are normal. Speech and behavior are normal.  ____________________________________________   LABS (all labs ordered are listed, but only abnormal results are displayed)  Labs Reviewed  GROUP A STREP BY PCR    ____________________________________________  EKG  None ____________________________________________  RADIOLOGY  No results found.  ____________________________________________   PROCEDURES  Procedure(s) performed: None  Procedures  Critical Care performed: No  ____________________________________________   INITIAL IMPRESSION / ASSESSMENT AND PLAN / ED COURSE  As part of my medical decision making, I reviewed the following data within the electronic MEDICAL RECORD NUMBER Nursing notes reviewed and incorporated, Labs reviewed and Notes from prior ED visits.   36 year old male presenting with sore throat.  Rapid strep is negative.  Patient tells me he has had strep throat in the past with negative rapid strep tests.  Will treat with amoxicillin, and treat empirically for STDs given patient's concern.  IM Decadron for inflammation, and Magic mouthwash for throat discomfort.  Strict return precautions given.  Patient verbalizes understanding and agrees with plan of care.  ____________________________________________   FINAL CLINICAL IMPRESSION(S) / ED DIAGNOSES  Final diagnoses:  Sore throat  Pharyngitis, unspecified etiology     ED Discharge Orders        Ordered    amoxicillin (AMOXIL) 500 MG capsule  3 times daily     05/24/17 0531    magic mouthwash SOLN  3 times daily PRN     05/24/17 0531       Note:  This document was prepared using Dragon voice recognition software and may include unintentional dictation errors.    Irean Hong, MD 05/24/17 [phone]

## 2017-05-24 NOTE — ED Triage Notes (Signed)
Patient to ER for c/o sore throat x2 days. Denies any known fevers. Also reports new sex partner, took part in oral sex a few days ago.

## 2017-05-30 ENCOUNTER — Encounter: Payer: Self-pay | Admitting: Emergency Medicine

## 2017-05-30 ENCOUNTER — Emergency Department
Admission: EM | Admit: 2017-05-30 | Discharge: 2017-05-31 | Disposition: A | Discharge status: Home / Self Care | Payer: Self-pay | Attending: Emergency Medicine | Admitting: Emergency Medicine

## 2017-05-30 ENCOUNTER — Emergency Department: Payer: Self-pay

## 2017-05-30 ENCOUNTER — Other Ambulatory Visit: Payer: Self-pay

## 2017-05-30 DIAGNOSIS — E079 Disorder of thyroid, unspecified: Secondary | ICD-10-CM | POA: Insufficient documentation

## 2017-05-30 DIAGNOSIS — J039 Acute tonsillitis, unspecified: Secondary | ICD-10-CM | POA: Insufficient documentation

## 2017-05-30 DIAGNOSIS — F17210 Nicotine dependence, cigarettes, uncomplicated: Secondary | ICD-10-CM | POA: Insufficient documentation

## 2017-05-30 DIAGNOSIS — Z79899 Other long term (current) drug therapy: Secondary | ICD-10-CM | POA: Insufficient documentation

## 2017-05-30 DIAGNOSIS — J36 Peritonsillar abscess: Secondary | ICD-10-CM

## 2017-05-30 DIAGNOSIS — J45909 Unspecified asthma, uncomplicated: Secondary | ICD-10-CM | POA: Insufficient documentation

## 2017-05-30 LAB — COMPREHENSIVE METABOLIC PANEL
ALT: 38 U/L (ref 17–63)
ANION GAP: 7 (ref 5–15)
AST: 27 U/L (ref 15–41)
Albumin: 3.5 g/dL (ref 3.5–5.0)
Alkaline Phosphatase: 48 U/L (ref 38–126)
BUN: 12 mg/dL (ref 6–20)
CO2: 27 mmol/L (ref 22–32)
Calcium: 9 mg/dL (ref 8.9–10.3)
Chloride: 104 mmol/L (ref 101–111)
Creatinine, Ser: 1.06 mg/dL (ref 0.61–1.24)
Glucose, Bld: 118 mg/dL — ABNORMAL HIGH (ref 65–99)
POTASSIUM: 3.9 mmol/L (ref 3.5–5.1)
Sodium: 138 mmol/L (ref 135–145)
Total Bilirubin: 0.6 mg/dL (ref 0.3–1.2)
Total Protein: 6.8 g/dL (ref 6.5–8.1)

## 2017-05-30 LAB — CBC
HEMATOCRIT: 46 % (ref 40.0–52.0)
Hemoglobin: 15.7 g/dL (ref 13.0–18.0)
MCH: 31.9 pg (ref 26.0–34.0)
MCHC: 34.2 g/dL (ref 32.0–36.0)
MCV: 93.1 fL (ref 80.0–100.0)
Platelets: 180 10*3/uL (ref 150–440)
RBC: 4.93 MIL/uL (ref 4.40–5.90)
RDW: 13.8 % (ref 11.5–14.5)
WBC: 10.3 10*3/uL (ref 3.8–10.6)

## 2017-05-30 MED ORDER — IOPAMIDOL (ISOVUE-300) INJECTION 61%
75.0000 mL | Freq: Once | INTRAVENOUS | Status: AC | PRN
  Administered 2017-05-30: 75 mL via INTRAVENOUS
  Filled 2017-05-30: qty 75

## 2017-05-30 MED ORDER — PREDNISONE 10 MG PO TABS: ORAL_TABLET | ORAL | 0 refills | Status: DC

## 2017-05-30 MED ORDER — CLINDAMYCIN HCL 300 MG PO CAPS: 300.0000 mg | ORAL_CAPSULE | Freq: Four times a day (QID) | ORAL | 0 refills | Status: AC

## 2017-05-30 MED ORDER — CLINDAMYCIN PHOSPHATE 600 MG/50ML IV SOLN
600.0000 mg | Freq: Once | INTRAVENOUS | Status: AC
  Administered 2017-05-30: 600 mg via INTRAVENOUS
  Filled 2017-05-30: qty 50

## 2017-05-30 MED ORDER — DEXAMETHASONE SODIUM PHOSPHATE 10 MG/ML IJ SOLN
10.0000 mg | Freq: Once | INTRAMUSCULAR | Status: AC
  Administered 2017-05-30: 10 mg via INTRAVENOUS
  Filled 2017-05-30: qty 1

## 2017-05-30 NOTE — ED Provider Notes (Signed)
[facility] Emergency Department Provider Note  ____________________________________________  Time seen: Approximately 9:46 PM  I have reviewed the triage vital signs and the nursing notes.   HISTORY  Chief Complaint Sore Throat    HPI Ronald Montgomery is a 37 y.o. male with past medical history of epiglottitis presents to emergency department for worsening throat pain.  Patient was seen in the emergency department 6 days ago and was placed on amoxicillin. He has been taking amoxicillin as prescribed but pain is getting worse.  He is having pain with swallowing.  He is unsure of fever.  No shortness of breath, chest pain, nausea, vomiting, abdominal pain.   Past Medical History:  Diagnosis Date  . Alcohol abuse   . Asthma   . Chlamydia   . Constipation   . Penile pain 02/03/2015   Normal exams x several   . Scabies   . Testicular/scrotal pain 02/03/2015   Normal exam and [place] x several.    . Thyroid disorder    during childhood    Patient Active Problem List   Diagnosis Date Noted  . Epiglottitis 07/07/2015  . Penile pain 02/03/2015  . Testicular/scrotal pain 02/03/2015    Past Surgical History:  Procedure Laterality Date  . ABCESS DRAINAGE     groin area  . CYST EXCISION     top of head  . DIRECT LARYNGOSCOPY  07/07/2015   Procedure: DIRECT LARYNGOSCOPY;  Surgeon: Linus Salmons, MD;  Location: [facility] ORS;  Service: ENT;;  . INCISION AND DRAINAGE ABSCESS N/A 07/07/2015   Procedure: INCISION AND DRAINAGE ABSCESS;  Surgeon: Linus Salmons, MD;  Location: [facility] ORS;  Service: ENT;  Laterality: N/A;  epiglottic abscess  . RHYTIDECTOMY NECK / CHEEK / CHIN     due to accident    Prior to Admission medications   Medication Sig Start Date End Date Taking? Authorizing Provider  amoxicillin (AMOXIL) 500 MG capsule Take 1 capsule (500 mg total) by mouth 3 (three) times daily. 05/24/17   Irean Hong, MD  bacitracin ointment Apply 1 application topically 2  (two) times daily. 01/07/17   Eber Hong, MD  cetirizine (ZYRTEC ALLERGY) 10 MG tablet Take 1 tablet (10 mg total) by mouth daily. 04/22/17   Enid Derry, PA-C  clindamycin (CLEOCIN) 300 MG capsule Take 1 capsule (300 mg total) by mouth 4 (four) times daily for 10 days. 05/30/17 06/09/17  Enid Derry, PA-C  doxycycline (VIBRAMYCIN) 100 MG capsule Take 1 capsule (100 mg total) by mouth 2 (two) times daily. 01/07/17   Eber Hong, MD  magic mouthwash SOLN Take 5 mLs by mouth 3 (three) times daily as needed for mouth pain. 05/24/17   Irean Hong, MD  omeprazole (PRILOSEC) 10 MG capsule Take 1 capsule (10 mg total) by mouth daily. 09/07/16   Cuthriell, Delorise Royals, PA-C  predniSONE (DELTASONE) 10 MG tablet Take 6 tablets on day 1, take 5 tablets on day 2, take 4 tablets on day 3, take 3 tablets on day 4, take 2 tablets on day 5, take 1 tablet on day 6 05/30/17   Enid Derry, PA-C    Allergies Patient has no known allergies.  Family History  Problem Relation Age of Onset  . Skin cancer Mother   . Diabetes Mother   . Thyroid cancer Maternal Grandfather   . Heart attack Maternal Grandfather   . Diabetes Maternal Grandfather     Social History Social History   Tobacco Use  . Smoking status:  Current Every Day Smoker    Packs/day: 0.50    Types: Cigarettes  . Smokeless tobacco: Never Used  Substance Use Topics  . Alcohol use: Yes    Alcohol/week: 0.0 oz    Comment: occasional  . Drug use: No     Review of Systems  Eyes: No visual changes. No discharge. ENT: Negative for congestion and rhinorrhea. Cardiovascular: No chest pain. Respiratory: Negative for cough. No SOB. Gastrointestinal: No abdominal pain.  No nausea, no vomiting.  No diarrhea.  No constipation. Musculoskeletal: Negative for musculoskeletal pain. Skin: Negative for rash, abrasions, lacerations, ecchymosis. Neurological: Negative for headaches.   ____________________________________________   PHYSICAL  EXAM:  VITAL SIGNS: ED Triage Vitals  Enc Vitals Group     BP 05/30/17 1857 (!) 139/92     Pulse Rate 05/30/17 1857 (!) 102     Resp 05/30/17 1857 20     Temp 05/30/17 1857 99.1 F (37.3 C)     Temp Source 05/30/17 1857 Oral     SpO2 05/30/17 1857 98 %     Weight 05/30/17 1859 250 lb (113.4 kg)     Height 05/30/17 1859 5\' 6"  (1.676 m)     Head Circumference --      Peak Flow --      Pain Score 05/30/17 1857 9     Pain Loc --      Pain Edu? --      Excl. in GC? --      Constitutional: Alert and oriented. Well appearing and in no acute distress. Eyes: Conjunctivae are normal. PERRL. EOMI. No discharge. Head: Atraumatic. ENT: No frontal and maxillary sinus tenderness.      Ears: Tympanic membranes pearly gray with good landmarks. No discharge.      Nose: Mild congestion/rhinnorhea.      Mouth/Throat: Mucous membranes are moist. Oropharynx erythematous. Tonsils enlarged bilaterally. No exudates. Uvula midline.  No visible drainable abscess. Neck: No stridor.   Hematological/Lymphatic/Immunilogical: Tender anterior cervical lymphadenopathy. Cardiovascular: Normal rate, regular rhythm.  Good peripheral circulation. Respiratory: Normal respiratory effort without tachypnea or retractions. Lungs CTAB. Good air entry to the bases with no decreased or absent breath sounds. Gastrointestinal: Bowel sounds 4 quadrants. Soft and nontender to palpation. No guarding or rigidity. No palpable masses. No distention. Musculoskeletal: Full range of motion to all extremities. No gross deformities appreciated. Neurologic:  Normal speech and language. No gross focal neurologic deficits are appreciated.  Skin:  Skin is warm, dry and intact. No rash noted.  ____________________________________________   LABS (all labs ordered are listed, but only abnormal results are displayed)  Labs Reviewed  COMPREHENSIVE METABOLIC PANEL - Abnormal; Notable for the following components:      Result Value    Glucose, Bld 118 (*)    All other components within normal limits  CBC   ____________________________________________  EKG   ____________________________________________  RADIOLOGY   Ct Soft Tissue Neck W Contrast  Result Date: 05/30/2017 CLINICAL DATA:  Initial evaluation for acute sore throat, difficulty swallowing food. EXAM: CT NECK WITH CONTRAST TECHNIQUE: Multidetector CT imaging of the neck was performed using the standard protocol following the bolus administration of intravenous contrast. CONTRAST:  75mL ISOVUE-300 IOPAMIDOL (ISOVUE-300) INJECTION 61% COMPARISON:  Prior CT from 07/18/2015. FINDINGS: Pharynx and larynx: Oral cavity within normal limits without mass lesion or loculated fluid collection. No acute inflammatory changes seen about the dentition. Palatine tonsils are enlarged and prominent bilaterally, nearly abutting at the midline, suggesting acute tonsillitis. There is a focal  9 mm hypodense collection at the inferior aspect of the left palatine tonsil almost at the level of the vallecula, suspicious for a small tonsillar/peritonsillar abscess (series 7, image 47). Parapharyngeal fat preserved. Adenoidal soft tissues within normal limits. Nasopharynx normal. Epiglottis within normal limits. Lingual tonsils partially efface the vallecula. No retropharyngeal swelling or collection. Remainder of the hypopharynx and supraglottic larynx within normal limits. True cords apposed and not well evaluated. Subglottic airway clear. Salivary glands: Salivary glands including the parotid and submandibular glands are normal. Thyroid: Thyroid normal. Lymph nodes: Mildly enlarged bilateral level II lymph nodes measure up to 15 mm on the left and 16 mm on the right, likely reactive. No other adenopathy within the neck. Vascular: Normal intravascular enhancement seen throughout the neck. Limited intracranial: Unremarkable. Visualized orbits: Visualized globes and orbits within normal limits.  Mastoids and visualized paranasal sinuses: Visualized paranasal sinuses are clear. Visualize mastoids and middle ear cavities are clear. Skeleton: No acute osseous abnormality. No worrisome lytic or blastic osseous lesions. Mild degenerate spondylolysis noted at C5-6. Upper chest: Visualized upper chest within normal limits. Mildly prominent left axillary lymph node measuring up to 1 cm noted, upper limits of normal. Visualized lungs are clear. Other: None. IMPRESSION: 1. Enlargement of the palatine tonsils bilaterally, suggesting acute tonsillitis. Superimposed 9 mm hypodensity at the inferior left tonsil consistent with a small tonsillar/peritonsillar abscess. No significant mass effect. 2. Mildly enlarged bilateral level II adenopathy, likely reactive. Electronically Signed   By: Rise Mu M.D.   On: 05/30/2017 23:05    ____________________________________________    PROCEDURES  Procedure(s) performed:    Procedures    Medications  clindamycin (CLEOCIN) IVPB 600 mg (600 mg Intravenous New Bag/Given 05/30/17 2340)  dexamethasone (DECADRON) injection 10 mg (10 mg Intravenous Given 05/30/17 2122)  iopamidol (ISOVUE-300) 61 % injection 75 mL (75 mLs Intravenous Contrast Given 05/30/17 2223)     ____________________________________________   INITIAL IMPRESSION / ASSESSMENT AND PLAN / ED COURSE  Pertinent labs & imaging results that were available during my care of the patient were reviewed by me and considered in my medical decision making (see chart for details).  Review of the [place] CSRS was performed in accordance of the [facility] prior to dispensing any controlled drugs.   Patient's diagnosis is consistent with tonsillitis and possible 9 mm peritonsillar abscess. Vital signs and exam are reassuring.  CT scan significant with tonsillitis and abscess.  Oropharyngeal swelling improved significantly with Decadron.  IV clindamycin was given.  Patient feels comfortable going home. Patient  will be discharged home with prescriptions for clindamycin.  Patient will call ENT in the morning for appointment. Patient is given ED precautions to return to the ED for any worsening or new symptoms.     ____________________________________________  FINAL CLINICAL IMPRESSION(S) / ED DIAGNOSES  Final diagnoses:  Tonsillitis  Tonsil, abscess      NEW MEDICATIONS STARTED DURING THIS VISIT:  ED Discharge Orders        Ordered    clindamycin (CLEOCIN) 300 MG capsule  4 times daily     05/30/17 2320    predniSONE (DELTASONE) 10 MG tablet     05/30/17 2320          This chart was dictated using voice recognition software/Dragon. Despite best efforts to proofread, errors can occur which can change the meaning. Any change was purely unintentional.    Enid Derry, PA-C 05/31/17 0015    Sharyn Creamer, MD 06/01/17 [phone]

## 2017-05-30 NOTE — Discharge Instructions (Signed)
Call and make an appointment with ENT in the morning.

## 2017-05-30 NOTE — ED Triage Notes (Addendum)
Sore throat x 5 days. States is on amoxicillin but does not feel like he is improving.

## 2017-05-30 NOTE — ED Notes (Signed)
Pt c/o sore throat with difficulty swallowing spit and food for the past 2 days - he was here a few days ago and was placed on Amoxicillin without any relief

## 2017-06-13 ENCOUNTER — Ambulatory Visit: Payer: Self-pay | Admitting: Anesthesiology

## 2017-06-13 ENCOUNTER — Encounter: Payer: Self-pay | Admitting: *Deleted

## 2017-06-13 ENCOUNTER — Other Ambulatory Visit: Payer: Self-pay

## 2017-06-13 ENCOUNTER — Encounter
Admission: RE | Disposition: A | Discharge status: Home / Self Care | Payer: Self-pay | Source: Ambulatory Visit | Attending: Unknown Physician Specialty

## 2017-06-13 ENCOUNTER — Ambulatory Visit
Admission: RE | Admit: 2017-06-13 | Discharge: 2017-06-13 | Disposition: A | Discharge status: Home / Self Care | Payer: Self-pay | Source: Ambulatory Visit | Attending: Unknown Physician Specialty | Admitting: Unknown Physician Specialty

## 2017-06-13 DIAGNOSIS — F17200 Nicotine dependence, unspecified, uncomplicated: Secondary | ICD-10-CM | POA: Insufficient documentation

## 2017-06-13 DIAGNOSIS — J45909 Unspecified asthma, uncomplicated: Secondary | ICD-10-CM | POA: Insufficient documentation

## 2017-06-13 DIAGNOSIS — J3501 Chronic tonsillitis: Secondary | ICD-10-CM | POA: Insufficient documentation

## 2017-06-13 HISTORY — DX: Failed or difficult intubation, initial encounter: T88.4XXA

## 2017-06-13 HISTORY — PX: TONSILLECTOMY: SHX5217

## 2017-06-13 SURGERY — TONSILLECTOMY
Anesthesia: General | Laterality: Bilateral

## 2017-06-13 MED ORDER — ROCURONIUM BROMIDE 100 MG/10ML IV SOLN
INTRAVENOUS | Status: DC | PRN
  Administered 2017-06-13: 30 mg via INTRAVENOUS

## 2017-06-13 MED ORDER — FENTANYL CITRATE (PF) 100 MCG/2ML IJ SOLN
25.0000 ug | INTRAMUSCULAR | Status: AC | PRN
  Administered 2017-06-13 (×2): 25 ug via INTRAVENOUS

## 2017-06-13 MED ORDER — PROPOFOL 10 MG/ML IV BOLUS
INTRAVENOUS | Status: DC | PRN
  Administered 2017-06-13: 200 mg via INTRAVENOUS

## 2017-06-13 MED ORDER — OXYCODONE HCL 5 MG PO TABS
ORAL_TABLET | ORAL | Status: AC
  Filled 2017-06-13: qty 1

## 2017-06-13 MED ORDER — BUPIVACAINE HCL (PF) 0.5 % IJ SOLN
INTRAMUSCULAR | Status: DC | PRN
  Administered 2017-06-13: 6 mL

## 2017-06-13 MED ORDER — BUPIVACAINE HCL (PF) 0.5 % IJ SOLN
INTRAMUSCULAR | Status: AC
  Filled 2017-06-13: qty 30

## 2017-06-13 MED ORDER — ROCURONIUM BROMIDE 50 MG/5ML IV SOLN
INTRAVENOUS | Status: AC
  Filled 2017-06-13: qty 1

## 2017-06-13 MED ORDER — FENTANYL CITRATE (PF) 100 MCG/2ML IJ SOLN
25.0000 ug | INTRAMUSCULAR | Status: AC | PRN
  Administered 2017-06-13 (×6): 25 ug via INTRAVENOUS

## 2017-06-13 MED ORDER — SUCCINYLCHOLINE CHLORIDE 20 MG/ML IJ SOLN
INTRAMUSCULAR | Status: DC | PRN
  Administered 2017-06-13: 160 mg via INTRAVENOUS

## 2017-06-13 MED ORDER — OXYCODONE HCL 5 MG PO TABS
5.0000 mg | ORAL_TABLET | Freq: Once | ORAL | Status: AC | PRN
  Administered 2017-06-13: 5 mg via ORAL

## 2017-06-13 MED ORDER — SUGAMMADEX SODIUM 500 MG/5ML IV SOLN
INTRAVENOUS | Status: DC | PRN
  Administered 2017-06-13: 240 mg via INTRAVENOUS

## 2017-06-13 MED ORDER — ONDANSETRON HCL 4 MG/2ML IJ SOLN
INTRAMUSCULAR | Status: AC
  Filled 2017-06-13: qty 2

## 2017-06-13 MED ORDER — DEXAMETHASONE SODIUM PHOSPHATE 10 MG/ML IJ SOLN
INTRAMUSCULAR | Status: AC
Start: 2017-06-13 — End: ?
  Filled 2017-06-13: qty 1

## 2017-06-13 MED ORDER — DEXAMETHASONE SODIUM PHOSPHATE 10 MG/ML IJ SOLN
INTRAMUSCULAR | Status: DC | PRN
  Administered 2017-06-13: 10 mg via INTRAVENOUS

## 2017-06-13 MED ORDER — FENTANYL CITRATE (PF) 100 MCG/2ML IJ SOLN
INTRAMUSCULAR | Status: AC
  Administered 2017-06-13: 25 ug via INTRAVENOUS
  Filled 2017-06-13: qty 2

## 2017-06-13 MED ORDER — HYDRALAZINE HCL 20 MG/ML IJ SOLN
10.0000 mg | Freq: Once | INTRAMUSCULAR | Status: AC
  Administered 2017-06-13: 10 mg via INTRAVENOUS

## 2017-06-13 MED ORDER — LIDOCAINE HCL (PF) 2 % IJ SOLN
INTRAMUSCULAR | Status: AC
  Filled 2017-06-13: qty 10

## 2017-06-13 MED ORDER — HYDRALAZINE HCL 20 MG/ML IJ SOLN
INTRAMUSCULAR | Status: AC
  Administered 2017-06-13: 10 mg via INTRAVENOUS
  Filled 2017-06-13: qty 1

## 2017-06-13 MED ORDER — FENTANYL CITRATE (PF) 100 MCG/2ML IJ SOLN
INTRAMUSCULAR | Status: AC
  Filled 2017-06-13: qty 2

## 2017-06-13 MED ORDER — LIDOCAINE HCL (CARDIAC) 20 MG/ML IV SOLN
INTRAVENOUS | Status: DC | PRN
  Administered 2017-06-13: 100 mg via INTRAVENOUS

## 2017-06-13 MED ORDER — SUCCINYLCHOLINE CHLORIDE 20 MG/ML IJ SOLN
INTRAMUSCULAR | Status: AC
  Filled 2017-06-13: qty 1

## 2017-06-13 MED ORDER — HYDROCODONE-ACETAMINOPHEN 7.5-325 MG/15ML PO SOLN: 15.0000 mL | Freq: Four times a day (QID) | ORAL | 0 refills | Status: DC | PRN

## 2017-06-13 MED ORDER — MIDAZOLAM HCL 2 MG/2ML IJ SOLN
INTRAMUSCULAR | Status: AC
  Filled 2017-06-13: qty 2

## 2017-06-13 MED ORDER — MIDAZOLAM HCL 2 MG/2ML IJ SOLN
INTRAMUSCULAR | Status: DC | PRN
  Administered 2017-06-13: 2 mg via INTRAVENOUS

## 2017-06-13 MED ORDER — ONDANSETRON HCL 4 MG/2ML IJ SOLN
INTRAMUSCULAR | Status: DC | PRN
  Administered 2017-06-13: 4 mg via INTRAVENOUS

## 2017-06-13 MED ORDER — FENTANYL CITRATE (PF) 100 MCG/2ML IJ SOLN
INTRAMUSCULAR | Status: DC | PRN
  Administered 2017-06-13 (×2): 100 ug via INTRAVENOUS

## 2017-06-13 MED ORDER — PROPOFOL 10 MG/ML IV BOLUS
INTRAVENOUS | Status: AC
  Filled 2017-06-13: qty 20

## 2017-06-13 MED ORDER — OXYCODONE HCL 5 MG/5ML PO SOLN: 5.0000 mg | Freq: Once | ORAL | Status: AC | PRN

## 2017-06-13 MED ORDER — LACTATED RINGERS IV SOLN
INTRAVENOUS | Status: DC
  Administered 2017-06-13 (×2): via INTRAVENOUS

## 2017-06-13 SURGICAL SUPPLY — 13 items
CANISTER SUCT 1200ML W/VALVE (MISCELLANEOUS) ×2 IMPLANT
CATH ROBINSON RED A/P 10FR (CATHETERS) ×2 IMPLANT
COAG SUCT 10F 3.5MM HAND CTRL (MISCELLANEOUS) ×2 IMPLANT
ELECT CAUTERY BLADE TIP 2.5 (TIP) ×2
ELECT REM PT RETURN 9FT ADLT (ELECTROSURGICAL) ×2
ELECTRODE CAUTERY BLDE TIP 2.5 (TIP) ×1 IMPLANT
ELECTRODE REM PT RTRN 9FT ADLT (ELECTROSURGICAL) ×1 IMPLANT
GLOVE BIO SURGEON STRL SZ7.5 (GLOVE) ×2 IMPLANT
HANDLE SUCTION POOLE (INSTRUMENTS) ×1 IMPLANT
LABEL OR SOLS (LABEL) ×2 IMPLANT
NS IRRIG 500ML POUR BTL (IV SOLUTION) ×2 IMPLANT
PACK HEAD/NECK (MISCELLANEOUS) ×2 IMPLANT
SUCTION POOLE HANDLE (INSTRUMENTS) ×2

## 2017-06-13 NOTE — Op Note (Signed)
PREOPERATIVE DIAGNOSIS:  acute tonsillitis, unspecified  POSTOPERATIVE DIAGNOSIS:  Chronic Tonsillitis  OPERATION:  Tonsillectomy.  SURGEON:  Davina Pokehapman T. Charmine Bockrath, MD  ANESTHESIA:  General endotracheal.  OPERATIVE FINDINGS:  Large tonsils.  DESCRIPTION OF THE PROCEDURE: Ronald Montgomery was identified in the holding area and taken to the operating room and placed in the supine position.  After general endotracheal anesthesia, the table was turned 45 degrees and the patient was draped in the usual fashion for a tonsillectomy.  A mouth gag was inserted into the oral cavity.  There were large tonsils.  Beginning on the left-hand side a tenaculum was used to grasp the tonsil and the Bovie cautery was used to dissect it free from the fossa.  In a similar fashion, the right tonsil was removed.  Meticulous hemostasis was achieved using the Bovie cautery.  With both tonsils removed and no active bleeding, 0.5% plain Marcaine was used to inject the anterior and posterior tonsillar pillars bilaterally.  A total of 6ml was used.  The patient tolerated the procedure well and was awakened in the operating room and taken to the recovery room in stable condition.   CULTURES:  None.  SPECIMENS:  Tonsils.  ESTIMATED BLOOD LOSS:  Less than 10 ml.  Davina PokeChapman T Zoeann Mol  06/13/2017  11:09 AM

## 2017-06-13 NOTE — Discharge Instructions (Signed)
AMBULATORY SURGERY  DISCHARGE INSTRUCTIONS   1) The drugs that you were given will stay in your system until tomorrow so for the next 24 hours you should not:  A) Drive an automobile B) Make any legal decisions C) Drink any alcoholic beverage   2) You may resume regular meals tomorrow.  Today it is better to start with liquids and gradually work up to solid foods.  You may eat anything you prefer, but it is better to start with liquids, then soup and crackers, and gradually work up to solid foods.   3) Please notify your doctor immediately if you have any unusual bleeding, trouble breathing, redness and pain at the surgery site, drainage, fever, or pain not relieved by medication.    4) Additional Instructions:  Ice for comfort.  Please contact your physician with any problems or Same Day Surgery at [phone], Monday through Friday 6 am to 4 pm, or [place] at [facility] Main number at [phone].

## 2017-06-13 NOTE — Transfer of Care (Signed)
Immediate Anesthesia Transfer of Care Note  Patient: Ronald Montgomery  Procedure(s) Performed: TONSILLECTOMY (Bilateral )  Patient Location: PACU  Anesthesia Type:General  Level of Consciousness: awake, oriented and patient cooperative  Airway & Oxygen Therapy: Patient Spontanous Breathing and Patient connected to face mask oxygen  Post-op Assessment: Report given to RN, Post -op Vital signs reviewed and stable and Patient moving all extremities X 4  Post vital signs: Reviewed and stable  Last Vitals:  Vitals:   06/13/17 0815  BP: (!) 136/92  Pulse: 96  Resp: 16  Temp: 36.9 C  SpO2: 100%    Last Pain:  Vitals:   06/13/17 0815  TempSrc: Oral  PainSc: 0-No pain         Complications: No apparent anesthesia complications

## 2017-06-13 NOTE — H&P (Signed)
The patient's history has been reviewed, patient examined, no change in status, stable for surgery.  Questions were answered to the patients satisfaction.  

## 2017-06-13 NOTE — Anesthesia Post-op Follow-up Note (Signed)
Anesthesia QCDR form completed.        

## 2017-06-13 NOTE — Anesthesia Procedure Notes (Signed)
Procedure Name: Intubation Date/Time: 06/13/2017 10:39 AM Performed by: Michaele OfferSavage, Kalesha Irving, CRNA Pre-anesthesia Checklist: Patient identified, Emergency Drugs available, Suction available, Patient being monitored and Timeout performed Patient Re-evaluated:Patient Re-evaluated prior to induction Oxygen Delivery Method: Circle system utilized Preoxygenation: Pre-oxygenation with 100% oxygen Induction Type: IV induction Ventilation: Mask ventilation without difficulty Laryngoscope Size: McGraph and 4 Grade View: Grade I Tube type: Oral Rae Tube size: 7.5 mm Number of attempts: 1 Airway Equipment and Method: Rigid stylet Placement Confirmation: ETT inserted through vocal cords under direct vision,  positive ETCO2 and breath sounds checked- equal and bilateral Secured at: 22 cm Tube secured with: Tape Dental Injury: Teeth and Oropharynx as per pre-operative assessment

## 2017-06-13 NOTE — Anesthesia Preprocedure Evaluation (Signed)
Anesthesia Evaluation  Patient identified by MRN, date of birth, ID band Patient awake    Reviewed: Allergy & Precautions, H&P , NPO status , Patient's Chart, lab work & pertinent test results  History of Anesthesia Complications Negative for: history of anesthetic complications  Airway Mallampati: III  TM Distance: >3 FB Neck ROM: full    Dental  (+) Chipped, Poor Dentition   Pulmonary neg shortness of breath, asthma , Current Smoker,           Cardiovascular Exercise Tolerance: Good (-) angina(-) Past MI and (-) DOE negative cardio ROS       Neuro/Psych PSYCHIATRIC DISORDERS negative neurological ROS     GI/Hepatic negative GI ROS, Neg liver ROS, neg GERD  ,  Endo/Other  negative endocrine ROS  Renal/GU      Musculoskeletal   Abdominal   Peds  Hematology negative hematology ROS (+)   Anesthesia Other Findings Signs and symptoms suggestive of sleep apnea   Past Medical History: No date: Alcohol abuse No date: Asthma No date: Chlamydia No date: Constipation 02/03/2015: Penile pain     Comment:  Normal exams x several  No date: Scabies 02/03/2015: Testicular/scrotal pain     Comment:  Normal exam and US x several.   No date: Thyroid disorder     Comment:  during childhood  Past Surgical History: No date: ABCESS DRAINAGE     Comment:  groin area No date: CYST EXCISION     Comment:  top of head 07/07/2015: DIRECT LARYNGOSCOPY     Comment:  Procedure: DIRECT LARYNGOSCOPY;  Surgeon: Linus Salmonshapman               McQueen, MD;  Location: [facility] OR[facility];  Service: ENT;; 07/07/2015: INCISION AND DRAINAGE ABSCESS; N/A     Comment:  Procedure: INCISION AND DRAINAGE ABSCESS;  Surgeon:               Linus Salmonshapman McQueen, MD;  Location: [facility];  Service: ENT;               Laterality: N/A;  epiglottic abscess No date: RHYTIDECTOMY NECK / CHEEK / CHIN     Comment:  due to accident  BMI    Body Mass Index:  40.35 kg/m      Reproductive/Obstetrics negative OB ROS                             Anesthesia Physical Anesthesia Plan  ASA: III  Anesthesia Plan: General ETT   Post-op Pain Management:    Induction: Intravenous  PONV Risk Score and Plan: 2 and Ondansetron, Dexamethasone and Midazolam  Airway Management Planned: Oral ETT and Video Laryngoscope Planned  Additional Equipment:   Intra-op Plan:   Post-operative Plan: Extubation in OR  Informed Consent: I have reviewed the patients History and Physical, chart, labs and discussed the procedure including the risks, benefits and alternatives for the proposed anesthesia with the patient or authorized representative who has indicated his/her understanding and acceptance.   Dental Advisory Given  Plan Discussed with: Anesthesiologist, CRNA and Surgeon  Anesthesia Plan Comments: (Patient consented for risks of anesthesia including but not limited to:  - adverse reactions to medications - damage to teeth, lips or other oral mucosa - sore throat or hoarseness - Damage to heart, brain, lungs or loss of life  Patient voiced understanding.)        Anesthesia Quick Evaluation

## 2017-06-13 NOTE — Anesthesia Postprocedure Evaluation (Signed)
Anesthesia Post Note  Patient: [name]  Procedure(s) Performed: TONSILLECTOMY (Bilateral )  Patient location during evaluation: PACU Anesthesia Type: General Level of consciousness: awake and alert Pain management: pain level controlled Vital Signs Assessment: post-procedure vital signs reviewed and stable Respiratory status: spontaneous breathing, nonlabored ventilation, respiratory function stable and patient connected to nasal cannula oxygen Cardiovascular status: blood pressure returned to baseline and stable Postop Assessment: no apparent nausea or vomiting Anesthetic complications: no     Last Vitals:  Vitals:   06/13/17 1230 06/13/17 1240  BP: (!) 154/114 (!) 156/98  Pulse:  78  Resp:  13  Temp:  (!) 36.4 C  SpO2:  96%    Last Pain:  Vitals:   06/13/17 1240  TempSrc:   PainSc: 4                  Joseph K Piscitello

## 2017-06-15 ENCOUNTER — Other Ambulatory Visit: Payer: Self-pay

## 2017-06-15 ENCOUNTER — Emergency Department
Admission: EM | Admit: 2017-06-15 | Discharge: 2017-06-15 | Disposition: A | Discharge status: Home / Self Care | Payer: Self-pay | Attending: Emergency Medicine | Admitting: Emergency Medicine

## 2017-06-15 DIAGNOSIS — Z79899 Other long term (current) drug therapy: Secondary | ICD-10-CM | POA: Insufficient documentation

## 2017-06-15 DIAGNOSIS — G8918 Other acute postprocedural pain: Secondary | ICD-10-CM | POA: Insufficient documentation

## 2017-06-15 DIAGNOSIS — B370 Candidal stomatitis: Secondary | ICD-10-CM | POA: Insufficient documentation

## 2017-06-15 DIAGNOSIS — J45909 Unspecified asthma, uncomplicated: Secondary | ICD-10-CM | POA: Insufficient documentation

## 2017-06-15 DIAGNOSIS — F17210 Nicotine dependence, cigarettes, uncomplicated: Secondary | ICD-10-CM | POA: Insufficient documentation

## 2017-06-15 LAB — SURGICAL PATHOLOGY

## 2017-06-15 MED ORDER — NYSTATIN 100000 UNIT/ML MT SUSP: 5.0000 mL | Freq: Four times a day (QID) | OROMUCOSAL | 0 refills | Status: AC

## 2017-06-15 NOTE — ED Notes (Signed)
E-signature box not working. Pt verbalized understanding of discharge instructions and denied questions. 

## 2017-06-15 NOTE — ED Provider Notes (Signed)
[facility] Emergency Department Provider Note  Time seen: 8:44 AM  I have reviewed the triage vital signs and the nursing notes.   HISTORY  Chief Complaint Oral Swelling and Post-op Problem    HPI Ronald Montgomery is a 37 y.o. male 2 days status post tonsillectomy presents to the emergency department for throat pain, white on his tongue and a sensation of swelling in his throat.  According to the patient he had a tonsillectomy performed 2 days ago.  He was prescribed oxycodone and steroids but has not filled them because he did not get paid until today.  States he has been having a lot of pain in the back of his throat, feels a sensation of swelling at times which he describes more in his nose like a snoring sensation.  Patient is also notes a significant amount of white on his tongue and was concerned that there could be an infection so he came to the emergency department for evaluation.  Denies any fever.  States occasional nausea since the surgery but denies any vomiting.  Largely negative review of systems.   Past Medical History:  Diagnosis Date  . Alcohol abuse   . Asthma   . Chlamydia   . Constipation   . Difficult intubation   . Penile pain 02/03/2015   Normal exams x several   . Scabies   . Testicular/scrotal pain 02/03/2015   Normal exam and US x several.    . Thyroid disorder    during childhood    Patient Active Problem List   Diagnosis Date Noted  . Epiglottitis 07/07/2015  . Penile pain 02/03/2015  . Testicular/scrotal pain 02/03/2015    Past Surgical History:  Procedure Laterality Date  . ABCESS DRAINAGE     groin area  . CYST EXCISION     top of head  . DIRECT LARYNGOSCOPY  07/07/2015   Procedure: DIRECT LARYNGOSCOPY;  Surgeon: Linus Salmonshapman McQueen, MD;  Location: [facility] ORS;  Service: ENT;;  . INCISION AND DRAINAGE ABSCESS N/A 07/07/2015   Procedure: INCISION AND DRAINAGE ABSCESS;  Surgeon: Linus Salmonshapman McQueen, MD;  Location: [facility] OR[facility];  Service:  ENT;  Laterality: N/A;  epiglottic abscess  . RHYTIDECTOMY NECK / CHEEK / CHIN     due to accident  . TONSILLECTOMY Bilateral 06/13/2017   Procedure: TONSILLECTOMY;  Surgeon: Linus SalmonsMcQueen, Chapman, MD;  Location: [facility] ORS;  Service: ENT;  Laterality: Bilateral;    Prior to Admission medications   Medication Sig Start Date End Date Taking? Authorizing Provider  HYDROcodone-acetaminophen (HYCET) 7.5-325 mg/15 ml solution Take 15 mLs by mouth 4 (four) times daily as needed for moderate pain. 06/13/17 06/13/18  Linus SalmonsMcQueen, Chapman, MD  omeprazole (PRILOSEC) 10 MG capsule Take 1 capsule (10 mg total) by mouth daily. 09/07/16   Cuthriell, Delorise RoyalsJonathan D, PA-C    No Known Allergies  Family History  Problem Relation Age of Onset  . Skin cancer Mother   . Diabetes Mother   . Thyroid cancer Maternal Grandfather   . Heart attack Maternal Grandfather   . Diabetes Maternal Grandfather     Social History Social History   Tobacco Use  . Smoking status: Current Every Day Smoker    Packs/day: 0.50    Types: Cigarettes  . Smokeless tobacco: Never Used  Substance Use Topics  . Alcohol use: Yes    Alcohol/week: 0.0 oz    Comment: occasional  . Drug use: No    Review of Systems Constitutional: Negative for fever. Eyes: Negative  for visual complaints ENT: Pain to his throat, swelling to his soft palate. Cardiovascular: Negative for chest pain. Respiratory: Negative for shortness of breath. Gastrointestinal: Negative for abdominal pain.  Occasional nausea but denies vomiting Genitourinary: Negative for urinary compaints Musculoskeletal: Negative for musculoskeletal complaints Skin: Negative for skin complaints  Neurological: Occasional headaches. All other ROS negative  ____________________________________________   PHYSICAL EXAM:  VITAL SIGNS: ED Triage Vitals [06/15/17 0814]  Enc Vitals Group     BP 137/85     Pulse Rate 92     Resp 18     Temp 98.2 F (36.8 C)     Temp Source Oral      SpO2 98 %     Weight      Height      Head Circumference      Peak Flow      Pain Score      Pain Loc      Pain Edu?      Excl. in GC?    Constitutional: Alert and oriented. Well appearing and in no distress. Eyes: Normal exam ENT   Head: Normocephalic and atraumatic.   Nose: No congestion/rhinnorhea.   Mouth/Throat: Patient has significant granulation tissue exposed to the back of the throat bilaterally as well as the uvula, consistent with 2 days status post tonsillectomy.  No oral edema noted besides mild edema of the uvula itself, but no signs of infection.  Patient does have a white covering his tongue which is difficult to scrape off, possibly indicating thrush. Cardiovascular: Normal rate, regular rhythm. No murmurs, rubs, or gallops. Respiratory: Normal respiratory effort without tachypnea nor retractions. Breath sounds are clear  Gastrointestinal: Soft and nontender. No distention. Musculoskeletal: Nontender with normal range of motion in all extremities Neurologic:  Normal speech and language. No gross focal neurologic deficits  Skin:  Skin is warm, dry and intact.  Psychiatric: Mood and affect are normal.  ____________________________________________   INITIAL IMPRESSION / ASSESSMENT AND PLAN / ED COURSE  Pertinent labs & imaging results that were available during my care of the patient were reviewed by me and considered in my medical decision making (see chart for details).  Patient presents 2 days after tonsillectomy with pain in his throat, snoring and white on his tongue.  Differential would include thrush, postoperative pain, uvulitis.  On exam patient does have white on his tongue which is difficult to scrape off possibly indicating thrush.  We will place on a nystatin rinse for the next 7 days.  Patient's throat appears consistent with expected postoperative changes after a tonsillectomy.  No signs of edema or swelling of the throat itself, tonsillectomy  involves part of the uvula as well with granulation tissue on the posterior uvula, suspect the patient's snoring/swelling sensation is to to postoperative inflammation.  No signs of active infection.  No fever.  Patient states he will fill his steroids and oxycodone today as he got paid this morning.  We will also prescribe a nystatin rinse for the patient and have him follow-up with ENT.  I discussed return precautions, patient is agreeable to this plan.  ____________________________________________   FINAL CLINICAL IMPRESSION(S) / ED DIAGNOSES  Thrush Postoperative pain    Minna Antis, MD 06/15/17 [phone]

## 2017-06-15 NOTE — ED Triage Notes (Signed)
Pt states he had a tonsilectomy 2 days ago and is having increased sensation of swelling in his throat with mild bleeding.

## 2017-06-15 NOTE — Discharge Instructions (Signed)
As we discussed please fill and begin taking your steroids and pain medicine prescribed by your ENT doctor.  Please follow-up with your ENT doctor if you continue to have discomfort over the next 2-3 days.  Return to the emergency department if you develop significant worsening of pain, any trouble breathing, or any other symptom personally concerning to yourself.  Please fill and begin taking your nystatin oral rinse, swish and swallow 4 times daily for the next 7 days.

## 2017-06-18 ENCOUNTER — Emergency Department: Payer: Self-pay

## 2017-06-18 ENCOUNTER — Other Ambulatory Visit: Payer: Self-pay

## 2017-06-18 ENCOUNTER — Emergency Department
Admission: EM | Admit: 2017-06-18 | Discharge: 2017-06-18 | Disposition: A | Discharge status: Home / Self Care | Payer: Self-pay | Attending: Emergency Medicine | Admitting: Emergency Medicine

## 2017-06-18 ENCOUNTER — Encounter: Payer: Self-pay | Admitting: Emergency Medicine

## 2017-06-18 DIAGNOSIS — G8918 Other acute postprocedural pain: Secondary | ICD-10-CM

## 2017-06-18 DIAGNOSIS — F17210 Nicotine dependence, cigarettes, uncomplicated: Secondary | ICD-10-CM | POA: Insufficient documentation

## 2017-06-18 DIAGNOSIS — Z79899 Other long term (current) drug therapy: Secondary | ICD-10-CM | POA: Insufficient documentation

## 2017-06-18 DIAGNOSIS — Z9089 Acquired absence of other organs: Secondary | ICD-10-CM | POA: Insufficient documentation

## 2017-06-18 DIAGNOSIS — J45909 Unspecified asthma, uncomplicated: Secondary | ICD-10-CM | POA: Insufficient documentation

## 2017-06-18 DIAGNOSIS — J029 Acute pharyngitis, unspecified: Secondary | ICD-10-CM

## 2017-06-18 LAB — COMPREHENSIVE METABOLIC PANEL
ALBUMIN: 3.5 g/dL (ref 3.5–5.0)
ALK PHOS: 42 U/L (ref 38–126)
ALT: 32 U/L (ref 17–63)
ANION GAP: 8 (ref 5–15)
AST: 23 U/L (ref 15–41)
BILIRUBIN TOTAL: 0.8 mg/dL (ref 0.3–1.2)
BUN: 16 mg/dL (ref 6–20)
CALCIUM: 8.5 mg/dL — AB (ref 8.9–10.3)
CO2: 26 mmol/L (ref 22–32)
CREATININE: 1.02 mg/dL (ref 0.61–1.24)
Chloride: 102 mmol/L (ref 101–111)
GFR calc Af Amer: >60 mL/min (ref >60)
GFR calc non Af Amer: >60 mL/min (ref >60)
Glucose, Bld: 111 mg/dL — ABNORMAL HIGH (ref 65–99)
Potassium: 3.5 mmol/L (ref 3.5–5.1)
Sodium: 136 mmol/L (ref 135–145)
Total Protein: 6.9 g/dL (ref 6.5–8.1)

## 2017-06-18 LAB — CBC WITH DIFFERENTIAL/PLATELET
BASOS ABS: 0.1 10*3/uL (ref 0–0.1)
Basophils Relative: 1 %
EOS ABS: 0.1 10*3/uL (ref 0–0.7)
EOS PCT: 1 %
HCT: 41.6 % (ref 40.0–52.0)
Hemoglobin: 14.2 g/dL (ref 13.0–18.0)
Lymphocytes Relative: 19 %
Lymphs Abs: 2 10*3/uL (ref 1.0–3.6)
MCH: 31.5 pg (ref 26.0–34.0)
MCHC: 34.1 g/dL (ref 32.0–36.0)
MCV: 92.4 fL (ref 80.0–100.0)
MONO ABS: 1 10*3/uL (ref 0.2–1.0)
MONOS PCT: 9 %
Neutro Abs: 7.7 10*3/uL — ABNORMAL HIGH (ref 1.4–6.5)
Neutrophils Relative %: 70 %
PLATELETS: 189 10*3/uL (ref 150–440)
RBC: 4.5 MIL/uL (ref 4.40–5.90)
RDW: 13.4 % (ref 11.5–14.5)
WBC: 10.9 10*3/uL — ABNORMAL HIGH (ref 3.8–10.6)

## 2017-06-18 LAB — INFLUENZA PANEL BY PCR (TYPE A & B)
INFLAPCR: NEGATIVE
INFLBPCR: NEGATIVE

## 2017-06-18 LAB — LACTIC ACID, PLASMA: Lactic Acid, Venous: 1.1 mmol/L (ref 0.5–1.9)

## 2017-06-18 MED ORDER — SODIUM CHLORIDE 0.9 % IV BOLUS (SEPSIS)
2000.0000 mL | Freq: Once | INTRAVENOUS | Status: AC
  Administered 2017-06-18: 2000 mL via INTRAVENOUS

## 2017-06-18 MED ORDER — KETOROLAC TROMETHAMINE 30 MG/ML IJ SOLN
15.0000 mg | Freq: Once | INTRAMUSCULAR | Status: AC
Start: 2017-06-18 — End: 2017-06-18
  Administered 2017-06-18: 15 mg via INTRAVENOUS
  Filled 2017-06-18: qty 1

## 2017-06-18 MED ORDER — ONDANSETRON HCL 4 MG/2ML IJ SOLN
4.0000 mg | Freq: Once | INTRAMUSCULAR | Status: AC
  Administered 2017-06-18: 4 mg via INTRAVENOUS
  Filled 2017-06-18: qty 2

## 2017-06-18 MED ORDER — AMOXICILLIN-POT CLAVULANATE 250-62.5 MG/5ML PO SUSR: 1000.0000 mg | Freq: Two times a day (BID) | ORAL | 0 refills | Status: DC

## 2017-06-18 MED ORDER — HYDROCODONE-ACETAMINOPHEN 5-325 MG PO TABS: 1.0000 | ORAL_TABLET | Freq: Four times a day (QID) | ORAL | 0 refills | Status: DC | PRN

## 2017-06-18 MED ORDER — IOPAMIDOL (ISOVUE-300) INJECTION 61%
75.0000 mL | Freq: Once | INTRAVENOUS | Status: AC | PRN
  Administered 2017-06-18: 75 mL via INTRAVENOUS

## 2017-06-18 MED ORDER — MORPHINE SULFATE (PF) 4 MG/ML IV SOLN
8.0000 mg | Freq: Once | INTRAVENOUS | Status: AC
  Administered 2017-06-18: 4 mg via INTRAVENOUS
  Filled 2017-06-18: qty 2

## 2017-06-18 NOTE — Discharge Instructions (Signed)
Please take your antibiotics as prescribed and use your pain medication only as needed for severe symptoms.  Return to the ED sooner for any new or worsening symptoms.  Keep your appointment with Dr. Jenne Campus as scheduled.  It was a pleasure to take care of you today, and thank you for coming to our emergency department.  If you have any questions or concerns before leaving please ask the nurse to grab me and I'm more than happy to go through your aftercare instructions again.  If you were prescribed any opioid pain medication today such as Norco, Vicodin, Percocet, morphine, hydrocodone, or oxycodone please make sure you do not drive when you are taking this medication as it can alter your ability to drive safely.  If you have any concerns once you are home that you are not improving or are in fact getting worse before you can make it to your follow-up appointment, please do not hesitate to call 911 and come back for further evaluation.  Merrily Brittle, MD  Results for orders placed or performed during the hospital encounter of 06/18/17  Comprehensive metabolic panel  Result Value Ref Range   Sodium 136 135 - 145 mmol/L   Potassium 3.5 3.5 - 5.1 mmol/L   Chloride 102 101 - 111 mmol/L   CO2 26 22 - 32 mmol/L   Glucose, Bld 111 (H) 65 - 99 mg/dL   BUN 16 6 - 20 mg/dL   Creatinine, Ser 1.61 0.61 - 1.24 mg/dL   Calcium 8.5 (L) 8.9 - 10.3 mg/dL   Total Protein 6.9 6.5 - 8.1 g/dL   Albumin 3.5 3.5 - 5.0 g/dL   AST 23 15 - 41 U/L   ALT 32 17 - 63 U/L   Alkaline Phosphatase 42 38 - 126 U/L   Total Bilirubin 0.8 0.3 - 1.2 mg/dL   GFR calc non Af Amer >60 >60 mL/min   GFR calc Af Amer >60 >60 mL/min   Anion gap 8 5 - 15  CBC with Differential  Result Value Ref Range   WBC 10.9 (H) 3.8 - 10.6 K/uL   RBC 4.50 4.40 - 5.90 MIL/uL   Hemoglobin 14.2 13.0 - 18.0 g/dL   HCT 09.6 04.5 - 40.9 %   MCV 92.4 80.0 - 100.0 fL   MCH 31.5 26.0 - 34.0 pg   MCHC 34.1 32.0 - 36.0 g/dL   RDW 81.1 91.4 - 78.2  %   Platelets 189 150 - 440 K/uL   Neutrophils Relative % 70 %   Neutro Abs 7.7 (H) 1.4 - 6.5 K/uL   Lymphocytes Relative 19 %   Lymphs Abs 2.0 1.0 - 3.6 K/uL   Monocytes Relative 9 %   Monocytes Absolute 1.0 0.2 - 1.0 K/uL   Eosinophils Relative 1 %   Eosinophils Absolute 0.1 0 - 0.7 K/uL   Basophils Relative 1 %   Basophils Absolute 0.1 0 - 0.1 K/uL  Lactic acid, plasma  Result Value Ref Range   Lactic Acid, Venous 1.1 0.5 - 1.9 mmol/L  Influenza panel by PCR (type A & B)  Result Value Ref Range   Influenza A By PCR NEGATIVE NEGATIVE   Influenza B By PCR NEGATIVE NEGATIVE   Dg Chest 2 View  Result Date: 06/18/2017 CLINICAL DATA:  Fever and sore throat.  Recent tonsillectomy EXAM: CHEST  2 VIEW COMPARISON:  July 08, 2015 FINDINGS: Lungs are clear. Heart size and pulmonary vascularity are normal. No adenopathy. No bone lesions. IMPRESSION: No  edema or consolidation. Electronically Signed   By: Bretta BangWilliam  Woodruff III M.D.   On: 06/18/2017 09:41   Ct Soft Tissue Neck W Contrast  Result Date: 06/18/2017 CLINICAL DATA:  37 year old male with sore throat earlier this month. Underwent tonsillectomy, postoperative day 5. Diagnosed with thrush 3 days ago. Subjective fever. EXAM: CT NECK WITH CONTRAST TECHNIQUE: Multidetector CT imaging of the neck was performed using the standard protocol following the bolus administration of intravenous contrast. CONTRAST:  75mL ISOVUE-300 IOPAMIDOL (ISOVUE-300) INJECTION 61% COMPARISON:  Neck CT 05/30/2017 and earlier. FINDINGS: Pharynx and larynx: Negative larynx. There is mild generalized pharyngeal mucosal space soft tissue thickening from the hypopharynx to the nasopharynx. There is superimposed more lobulated soft tissue thickening at the left glossal tonsillar sulcus seen on series 2, image 44. this measures roughly 15 millimeters diameter. There is also generalized enlargement of the uvula. There is no associated mucosal fluid collection to suggest  abscess. The bilateral parapharyngeal spaces remain normal. The retropharyngeal space appears normal. There is no subcutaneous or other deep soft tissue space gas in the neck. Salivary glands: Negative sublingual space. Bilateral submandibular glands and parotid glands are normal. Thyroid: Negative. Lymph nodes: Mild if any retropharyngeal lymph node enlargement since the prior neck CT - perhaps on the right series 2, image 31 measuring 5-6 millimeters. Mildly decreased bilateral maximal level 2 lymph nodes, individually up to 12 millimeters short axis (15-16 millimeters on 05/30/2017). The other bilateral lymph node stations are stable and normal, with fairly diminutive nodes. No cystic or necrotic nodes. Vascular: Major vascular structures in the neck and at the skull base remain patent. Incidental tortuosity of the cervical right ICA just below the skull base is stable. Limited intracranial: Negative. Visualized orbits: Not included. Mastoids and visualized paranasal sinuses: Stable and clear. Skeleton: No acute osseous abnormality identified. 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 There is mildly elongated stylohyoid ligament calcification bilaterally, up to 4 centimeters. Upper chest: Normal superior mediastinum. Visible lungs are clear. Visible axillary lymph nodes are normal. IMPRESSION: 1. Generalized pharyngeal mucosal space edema, including moderate to severe swelling of the uvula. There is a 15 mm lobulated area of residual soft tissue along the left left palatine tonsil at the glossal tonsillar sulcus (series 2, image 44), but no evidence of pharyngeal abscess. The parapharyngeal and retropharyngeal spaces remain normal. 2. Mild if any retropharyngeal lymph node enlargement since the prior neck CT. Chronic mildly enlarged bilateral level 2 lymph nodes have mildly decreased. Electronically Signed   By: Odessa FlemingH  Hall M.D.   On: 06/18/2017 11:01   Ct Soft Tissue Neck W  Contrast  Result Date: 05/30/2017 CLINICAL DATA:  Initial evaluation for acute sore throat, difficulty swallowing food. EXAM: CT NECK WITH CONTRAST TECHNIQUE: Multidetector CT imaging of the neck was performed using the standard protocol following the bolus administration of intravenous contrast. CONTRAST:  75mL ISOVUE-300 IOPAMIDOL (ISOVUE-300) INJECTION 61% COMPARISON:  Prior CT from 07/18/2015. FINDINGS: Pharynx and larynx: Oral cavity within normal limits without mass lesion or loculated fluid collection. No acute inflammatory changes seen about the dentition. Palatine tonsils are enlarged and prominent bilaterally, nearly abutting at the midline, suggesting acute tonsillitis. There is a focal 9 mm hypodense collection at the inferior aspect of the left palatine tonsil almost at the level of the vallecula, suspicious for a small tonsillar/peritonsillar abscess (series 7, image 47). Parapharyngeal fat preserved. Adenoidal soft tissues within normal limits. Nasopharynx normal. Epiglottis within normal limits. Lingual tonsils partially  efface the vallecula. No retropharyngeal swelling or collection. Remainder of the hypopharynx and supraglottic larynx within normal limits. True cords apposed and not well evaluated. Subglottic airway clear. Salivary glands: Salivary glands including the parotid and submandibular glands are normal. Thyroid: Thyroid normal. Lymph nodes: Mildly enlarged bilateral level II lymph nodes measure up to 15 mm on the left and 16 mm on the right, likely reactive. No other adenopathy within the neck. Vascular: Normal intravascular enhancement seen throughout the neck. Limited intracranial: Unremarkable. Visualized orbits: Visualized globes and orbits within normal limits. Mastoids and visualized paranasal sinuses: Visualized paranasal sinuses are clear. Visualize mastoids and middle ear cavities are clear. Skeleton: No acute osseous abnormality. No worrisome lytic or blastic osseous lesions.  Mild degenerate spondylolysis noted at C5-6. Upper chest: Visualized upper chest within normal limits. Mildly prominent left axillary lymph node measuring up to 1 cm noted, upper limits of normal. Visualized lungs are clear. Other: None. IMPRESSION: 1. Enlargement of the palatine tonsils bilaterally, suggesting acute tonsillitis. Superimposed 9 mm hypodensity at the inferior left tonsil consistent with a small tonsillar/peritonsillar abscess. No significant mass effect. 2. Mildly enlarged bilateral level II adenopathy, likely reactive. Electronically Signed   By: Rise Mu M.D.   On: 05/30/2017 23:05     Unfortunately prescriptions medications can be very expensive.  Please consider [facility] as they have a number of medications that are $4 for a 30 day supply.  Https://[email or web address].walmartimages.[email or web address]  Another great option is www.goodrx.com which can help you find the most affordable prices around you.  While here in the ER today you received very powerful medicine that makes it unsafe for you to drive for the rest of the day.  Do not drive until tomorrow.

## 2017-06-18 NOTE — ED Notes (Signed)
Pt states he had a tonsillectomy on 1/15 and was seen here on Friday with a dx of thrush, pt returned today with c/o continued throat pain and swelling. States he had a fever 102 this morning, afebrile on arrival. States the thrush has improved.

## 2017-06-18 NOTE — ED Notes (Signed)
Pt states his brother is coming to pick him up. Waiting for fluids to finish infusing before discharging the pt.

## 2017-06-18 NOTE — ED Provider Notes (Signed)
[facility] Emergency Department Provider Note  ____________________________________________   First MD Initiated Contact with Patient 06/18/17 [phone]     (approximate)  I have reviewed the triage vital signs and the nursing notes.   HISTORY  Chief Complaint Fever    HPI BURTIS IMHOFF is a 37 y.o. male who self presents the emergency department with fever to 102 degrees at home.  He is postoperative day 5 status post bilateral tonsillectomy and had initially done well postoperatively however he is noted a gradually progressive worsening of his pain.  His pain is particularly worse when swallowing solids.  He has no drooling.  Some mild shortness of breath.  Some rhinorrhea and dry cough.  After the temperature this morning he took Tylenol and came to the hospital.  He was actually seen in our hospital 3 days ago and was diagnosed with possible thrush and has been taking nystatin ever since.  The patient is concerned because in the past he had an abscess on his epiglottis that required intubation.  His symptoms currently are gradual onset his pain is now severe nonradiating.  Worsened with eating somewhat improved with oxycodone at home.  Past Medical History:  Diagnosis Date  . Alcohol abuse   . Asthma   . Chlamydia   . Constipation   . Difficult intubation   . Penile pain 02/03/2015   Normal exams x several   . Scabies   . Testicular/scrotal pain 02/03/2015   Normal exam and [place] x several.    . Thyroid disorder    during childhood    Patient Active Problem List   Diagnosis Date Noted  . Epiglottitis 07/07/2015  . Penile pain 02/03/2015  . Testicular/scrotal pain 02/03/2015    Past Surgical History:  Procedure Laterality Date  . ABCESS DRAINAGE     groin area  . CYST EXCISION     top of head  . DIRECT LARYNGOSCOPY  07/07/2015   Procedure: DIRECT LARYNGOSCOPY;  Surgeon: Linus Salmons, MD;  Location: [facility] ORS;  Service: ENT;;  . INCISION AND  DRAINAGE ABSCESS N/A 07/07/2015   Procedure: INCISION AND DRAINAGE ABSCESS;  Surgeon: Linus Salmons, MD;  Location: [facility] ORS;  Service: ENT;  Laterality: N/A;  epiglottic abscess  . RHYTIDECTOMY NECK / CHEEK / CHIN     due to accident  . TONSILLECTOMY Bilateral 06/13/2017   Procedure: TONSILLECTOMY;  Surgeon: Linus Salmons, MD;  Location: [facility] ORS;  Service: ENT;  Laterality: Bilateral;    Prior to Admission medications   Medication Sig Start Date End Date Taking? Authorizing Provider  amoxicillin-clavulanate (AUGMENTIN) 250-62.5 MG/5ML suspension Take 20 mLs (1,000 mg total) by mouth 2 (two) times daily. 06/18/17   Merrily Brittle, MD  HYDROcodone-acetaminophen (HYCET) 7.5-325 mg/15 ml solution Take 15 mLs by mouth 4 (four) times daily as needed for moderate pain. 06/13/17 06/13/18  Linus Salmons, MD  HYDROcodone-acetaminophen (NORCO) 5-325 MG tablet Take 1 tablet by mouth every 6 (six) hours as needed for up to 7 doses for severe pain. 06/18/17   Merrily Brittle, MD  nystatin (MYCOSTATIN) 100000 UNIT/ML suspension Take 5 mLs (500,000 Units total) by mouth 4 (four) times daily for 7 days. 06/15/17 06/22/17  Minna Antis, MD  omeprazole (PRILOSEC) 10 MG capsule Take 1 capsule (10 mg total) by mouth daily. 09/07/16   Cuthriell, Delorise Royals, PA-C    Allergies Patient has no known allergies.  Family History  Problem Relation Age of Onset  . Skin cancer Mother   .  Diabetes Mother   . Thyroid cancer Maternal Grandfather   . Heart attack Maternal Grandfather   . Diabetes Maternal Grandfather     Social History Social History   Tobacco Use  . Smoking status: Current Every Day Smoker    Packs/day: 0.50    Types: Cigarettes  . Smokeless tobacco: Never Used  Substance Use Topics  . Alcohol use: Yes    Alcohol/week: 0.0 oz    Comment: occasional  . Drug use: No    Review of Systems Constitutional: Positive for fever Eyes: No visual changes. ENT: Positive for sore  throat. Cardiovascular: Denies chest pain. Respiratory: Positive for shortness of breath. Gastrointestinal: No abdominal pain.  No nausea, no vomiting.  No diarrhea.  No constipation. Genitourinary: Negative for dysuria. Musculoskeletal: Negative for back pain. Skin: Negative for rash. Neurological: Negative for headaches, focal weakness or numbness.   ____________________________________________   PHYSICAL EXAM:  VITAL SIGNS: ED Triage Vitals  Enc Vitals Group     BP 06/18/17 0848 126/81     Pulse Rate 06/18/17 0848 79     Resp 06/18/17 0848 16     Temp 06/18/17 0848 98 F (36.7 C)     Temp Source 06/18/17 0848 Oral     SpO2 --      Weight 06/18/17 0849 250 lb (113.4 kg)     Height 06/18/17 0849 5\' 6"  (1.676 m)     Head Circumference --      Peak Flow --      Pain Score 06/18/17 0854 9     Pain Loc --      Pain Edu? --      Excl. in GC? --     Constitutional: Alert and oriented x4 anxious appearing lying on his side tearful Eyes: PERRL EOMI. Head: Atraumatic. Nose: No congestion/rhinnorhea. Mouth/Throat: Mild trismus.  Uvula slightly elongated but midline.  Tonsillectomy site bilaterally with no bleeding or obvious signs of infection Neck: No stridor.  No lymphadenopathy Cardiovascular: Normal rate, regular rhythm. Grossly normal heart sounds.  Good peripheral circulation. Respiratory: Normal respiratory effort.  No retractions. Lungs CTAB and moving good air Gastrointestinal: Obese soft nontender Musculoskeletal: No lower extremity edema   Neurologic:  Normal speech and language. No gross focal neurologic deficits are appreciated. Skin:  Skin is warm, dry and intact. No rash noted. Psychiatric: Mood and affect are normal. Speech and behavior are normal.    ____________________________________________   DIFFERENTIAL includes but not limited to  Retropharyngeal abscess, postoperative infection, epiglottitis, parapharyngeal abscess, influenza, pneumonia, upper  respiratory tract infection ____________________________________________   LABS (all labs ordered are listed, but only abnormal results are displayed)  Labs Reviewed  COMPREHENSIVE METABOLIC PANEL - Abnormal; Notable for the following components:      Result Value   Glucose, Bld 111 (*)    Calcium 8.5 (*)    All other components within normal limits  CBC WITH DIFFERENTIAL/PLATELET - Abnormal; Notable for the following components:   WBC 10.9 (*)    Neutro Abs 7.7 (*)    All other components within normal limits  LACTIC ACID, PLASMA  INFLUENZA PANEL BY PCR (TYPE A & B)  LACTIC ACID, PLASMA    Lab work reviewed by me with slightly elevated white count which is nonspecific __________________________________________  EKG   ____________________________________________  RADIOLOGY  CT neck reviewed by me with no evidence of abscess but does have significant swelling ____________________________________________   PROCEDURES  Procedure(s) performed: no  Procedures  Critical Care performed: no  Observation: no ____________________________________________   INITIAL IMPRESSION / ASSESSMENT AND PLAN / ED COURSE  Pertinent labs & imaging results that were available during my care of the patient were reviewed by me and considered in my medical decision making (see chart for details).  The patient arrives somewhat uncomfortable appearing with a fever of 102 degrees at home and is status post Tylenol.  He does have some rhinorrhea and a new cough which very well could be the etiology of his fever, although with reported worsening of his throat pain and being 5 days postoperative I have to be concerned about a postoperative infection.  CT scan of the neck is pending.     ----------------------------------------- 11:49 AM on 06/18/2017 -----------------------------------------  I discussed the case with on-call otolaryngologist Dr. Elenore Rota who made the following  recommendations: He recommends 10 mg dexamethasone IV x1 along with 2 L of fluid.  It is also reasonable to treat the patient with oral antibiotics for another 7 days mostly to prevent infection rather than treatment. ____________________________________________  ----------------------------------------- 12:46 PM on 06/18/2017 -----------------------------------------  The patient is now able to tolerate liquids and solids.  His vitals remained unremarkable.  He is protecting his airway.  He is stable for discharge according to the previous plan.  FINAL CLINICAL IMPRESSION(S) / ED DIAGNOSES  Final diagnoses:  Post-tonsillectomy pain  Pharyngitis, unspecified etiology      NEW MEDICATIONS STARTED DURING THIS VISIT:  New Prescriptions   AMOXICILLIN-CLAVULANATE (AUGMENTIN) 250-62.5 MG/5ML SUSPENSION    Take 20 mLs (1,000 mg total) by mouth 2 (two) times daily.   HYDROCODONE-ACETAMINOPHEN (NORCO) 5-325 MG TABLET    Take 1 tablet by mouth every 6 (six) hours as needed for up to 7 doses for severe pain.     Note:  This document was prepared using Dragon voice recognition software and may include unintentional dictation errors.     Merrily Brittle, MD 06/18/17 1246

## 2017-06-18 NOTE — ED Triage Notes (Signed)
Pt states had tonsillectomy on Tuesday 06/13/17. Pt states was seen on 06/15/17 and was told he had thrush and was prescribed Nystatin. Pt states this morning finished last of prescribed pain medication this morning. Pt states checked fever this morning at home, max temp was 102, no fever on arrival to ED. Pt reports sweats at home, and nausea. Pt reports took Tylenol at 0300 this morning.

## 2017-08-08 ENCOUNTER — Other Ambulatory Visit: Payer: Self-pay

## 2017-08-08 ENCOUNTER — Emergency Department (HOSPITAL_COMMUNITY)
Admission: EM | Admit: 2017-08-08 | Discharge: 2017-08-08 | Disposition: A | Discharge status: Home / Self Care | Payer: Self-pay | Attending: Emergency Medicine | Admitting: Emergency Medicine

## 2017-08-08 ENCOUNTER — Encounter (HOSPITAL_COMMUNITY): Payer: Self-pay | Admitting: Emergency Medicine

## 2017-08-08 DIAGNOSIS — N342 Other urethritis: Secondary | ICD-10-CM | POA: Insufficient documentation

## 2017-08-08 DIAGNOSIS — J45909 Unspecified asthma, uncomplicated: Secondary | ICD-10-CM | POA: Insufficient documentation

## 2017-08-08 DIAGNOSIS — F17210 Nicotine dependence, cigarettes, uncomplicated: Secondary | ICD-10-CM | POA: Insufficient documentation

## 2017-08-08 LAB — URINALYSIS, ROUTINE W REFLEX MICROSCOPIC
Bacteria, UA: NONE SEEN
Bilirubin Urine: NEGATIVE
Hgb urine dipstick: NEGATIVE
Ketones, ur: NEGATIVE mg/dL
Leukocytes, UA: NEGATIVE
NITRITE: NEGATIVE
Protein, ur: NEGATIVE mg/dL
SPECIFIC GRAVITY, URINE: 1.021 (ref 1.005–1.030)
pH: 6 (ref 5.0–8.0)

## 2017-08-08 LAB — CBG MONITORING, ED: GLUCOSE-CAPILLARY: 129 mg/dL — AB (ref 65–99)

## 2017-08-08 MED ORDER — CEFTRIAXONE SODIUM 250 MG IJ SOLR
250.0000 mg | Freq: Once | INTRAMUSCULAR | Status: AC
  Administered 2017-08-08: 250 mg via INTRAMUSCULAR
  Filled 2017-08-08: qty 250

## 2017-08-08 MED ORDER — STERILE WATER FOR INJECTION IJ SOLN
INTRAMUSCULAR | Status: AC
  Administered 2017-08-08: 2.1 mL
  Filled 2017-08-08: qty 10

## 2017-08-08 MED ORDER — AZITHROMYCIN 250 MG PO TABS
1000.0000 mg | ORAL_TABLET | Freq: Once | ORAL | Status: AC
  Administered 2017-08-08: 1000 mg via ORAL
  Filled 2017-08-08: qty 4

## 2017-08-08 NOTE — Discharge Instructions (Signed)
Use a condom each time that you have sex. You were tested for sexually transmitted diseases and given an HIV test tonight.  Results will not be back for a few days.  If your symptoms continue, follow-up at your local health department

## 2017-08-08 NOTE — ED Provider Notes (Signed)
[facility] EMERGENCY DEPARTMENT Provider Note   CSN: [id number] Arrival date & time: 08/08/17  1900     History   Chief Complaint Chief Complaint  Patient presents with  . Dysuria    HPI Ronald Montgomery is a 37 y.o. male.  Signs of dysuria with burning on urination in his penis onset 2 days ago.  He reports clear discharge from his penis earlier today.  Denies fever denies feeling ill.  No other associated symptoms.  No treatment prior to coming here.  No fever.  Worse with urination.  He is presently asymptomatic.  HPI  Past Medical History:  Diagnosis Date  . Alcohol abuse   . Asthma   . Chlamydia   . Constipation   . Difficult intubation   . Penile pain 02/03/2015   Normal exams x several   . Scabies   . Testicular/scrotal pain 02/03/2015   Normal exam and [place] x several.    . Thyroid disorder    during childhood    Patient Active Problem List   Diagnosis Date Noted  . Epiglottitis 07/07/2015  . Penile pain 02/03/2015  . Testicular/scrotal pain 02/03/2015    Past Surgical History:  Procedure Laterality Date  . ABCESS DRAINAGE     groin area  . CYST EXCISION     top of head  . DIRECT LARYNGOSCOPY  07/07/2015   Procedure: DIRECT LARYNGOSCOPY;  Surgeon: Linus Salmons, MD;  Location: [facility] ORS;  Service: ENT;;  . INCISION AND DRAINAGE ABSCESS N/A 07/07/2015   Procedure: INCISION AND DRAINAGE ABSCESS;  Surgeon: Linus Salmons, MD;  Location: [facility] ORS;  Service: ENT;  Laterality: N/A;  epiglottic abscess  . RHYTIDECTOMY NECK / CHEEK / CHIN     due to accident  . TONSILLECTOMY Bilateral 06/13/2017   Procedure: TONSILLECTOMY;  Surgeon: Linus Salmons, MD;  Location: [facility] ORS;  Service: ENT;  Laterality: Bilateral;       Home Medications    Prior to Admission medications   Medication Sig Start Date End Date Taking? Authorizing Provider  amoxicillin-clavulanate (AUGMENTIN) 250-62.5 MG/5ML suspension Take 20 mLs (1,000 mg total) by mouth 2 (two) times  daily. Patient not taking: Reported on 08/08/2017 06/18/17   Merrily Brittle, MD  HYDROcodone-acetaminophen (HYCET) 7.5-325 mg/15 ml solution Take 15 mLs by mouth 4 (four) times daily as needed for moderate pain. Patient not taking: Reported on 08/08/2017 06/13/17 06/13/18  Linus Salmons, MD  HYDROcodone-acetaminophen [facility]) 5-325 MG tablet Take 1 tablet by mouth every 6 (six) hours as needed for up to 7 doses for severe pain. Patient not taking: Reported on 08/08/2017 06/18/17   Merrily Brittle, MD    Family History Family History  Problem Relation Age of Onset  . Skin cancer Mother   . Diabetes Mother   . Thyroid cancer Maternal Grandfather   . Heart attack Maternal Grandfather   . Diabetes Maternal Grandfather     Social History Social History   Tobacco Use  . Smoking status: Current Every Day Smoker    Packs/day: 0.50    Types: Cigarettes  . Smokeless tobacco: Never Used  Substance Use Topics  . Alcohol use: Yes    Alcohol/week: 0.0 oz    Comment: occasional  . Drug use: No     Allergies   Patient has no known allergies.   Review of Systems Review of Systems  Constitutional: Negative.   HENT: Negative.   Respiratory: Negative.   Cardiovascular: Negative.   Gastrointestinal: Negative.   Genitourinary: Positive for  discharge and dysuria.  Musculoskeletal: Negative.   Skin: Negative.   Neurological: Negative.   Psychiatric/Behavioral: Negative.   All other systems reviewed and are negative.    Physical Exam Updated Vital Signs BP 120/72 (BP Location: Right Arm)   Pulse 83   Temp 98.1 F (36.7 C) (Oral)   Resp 16   Ht 5\' 6"  (1.676 m)   Wt 120.7 kg (266 lb)   SpO2 100%   BMI 42.93 kg/m   Physical Exam  Constitutional: He appears well-developed and well-nourished.  HENT:  Head: Normocephalic and atraumatic.  Eyes: Conjunctivae are normal. Pupils are equal, round, and reactive to light.  Neck: Neck supple. No tracheal deviation present. No thyromegaly  present.  Cardiovascular: Normal rate and regular rhythm.  No murmur heard. Pulmonary/Chest: Effort normal and breath sounds normal.  Abdominal: Soft. Bowel sounds are normal. He exhibits no distension. There is no tenderness.  Obese  Genitourinary:  Genitourinary Comments: Uncircumcised.  Penis normal.  Foreskin retracted.  Glans appears normal.  No discharge.  Scrotum normal  Musculoskeletal: Normal range of motion. He exhibits no edema or tenderness.  Neurological: He is alert. Coordination normal.  Skin: Skin is warm and dry. No rash noted.  Psychiatric: He has a normal mood and affect.  Nursing note and vitals reviewed.    ED Treatments / Results  Labs (all labs ordered are listed, but only abnormal results are displayed) Labs Reviewed  URINALYSIS, ROUTINE W REFLEX MICROSCOPIC - Abnormal; Notable for the following components:      Result Value   Glucose, UA >=500 (*)    Squamous Epithelial / LPF 0-5 (*)    All other components within normal limits    EKG  EKG Interpretation None       Radiology No results found.  Procedures Procedures (including critical care time)  Medications Ordered in ED Medications - No data to display Results for orders placed or performed during the hospital encounter of 08/08/17  Urinalysis, Routine w reflex microscopic  Result Value Ref Range   Color, Urine YELLOW YELLOW   APPearance CLEAR CLEAR   Specific Gravity, Urine 1.021 1.005 - 1.030   pH 6.0 5.0 - 8.0   Glucose, UA >=500 (A) NEGATIVE mg/dL   Hgb urine dipstick NEGATIVE NEGATIVE   Bilirubin Urine NEGATIVE NEGATIVE   Ketones, ur NEGATIVE NEGATIVE mg/dL   Protein, ur NEGATIVE NEGATIVE mg/dL   Nitrite NEGATIVE NEGATIVE   Leukocytes, UA NEGATIVE NEGATIVE   RBC / HPF 0-5 0 - 5 RBC/hpf   WBC, UA 0-5 0 - 5 WBC/hpf   Bacteria, UA NONE SEEN NONE SEEN   Squamous Epithelial / LPF 0-5 (A) NONE SEEN   Mucus PRESENT   CBG monitoring, ED  Result Value Ref Range   Glucose-Capillary  129 (H) 65 - 99 mg/dL   No results found.  Initial Impression / Assessment and Plan / ED Course  I have reviewed the triage vital signs and the nursing notes.  Pertinent labs & imaging results that were available during my care of the patient were reviewed by me and considered in my medical decision making (see chart for details).     11:15 PM patient resting comfortably.  No distress. Patient empirically for urethritis, Zithromax, Rocephin.  Safe sex encouraged her to follow-up with health department STD panel pending Final Clinical Impressions(s) / ED Diagnoses  Diagnosis urethritis Final diagnoses:  None    ED Discharge Orders    None  Doug SouJacubowitz, Dorotea Hand, MD 08/08/17 [phone]

## 2017-08-08 NOTE — ED Notes (Signed)
Pt says he has had a rocephin injection before with no reaction. Pt refuses to wait after IM injection given. Pt reports he has to go to work.

## 2017-08-08 NOTE — ED Triage Notes (Signed)
Pt c/o dysuria and states he had some clear discharge from penis earlier today. Pt states his urine is dark and has a foul odor. Pt states his groin feels as if it is swollen.

## 2017-08-10 LAB — URINE CULTURE
Culture: NO GROWTH
SPECIAL REQUESTS: NORMAL

## 2017-08-10 LAB — GC/CHLAMYDIA PROBE AMP (~~LOC~~) NOT AT ARMC
Chlamydia: NEGATIVE
Neisseria Gonorrhea: NEGATIVE

## 2017-08-10 LAB — RPR: RPR: NONREACTIVE

## 2017-08-10 LAB — HIV ANTIBODY (ROUTINE TESTING W REFLEX): HIV Screen 4th Generation wRfx: NONREACTIVE

## 2018-03-28 ENCOUNTER — Emergency Department (HOSPITAL_COMMUNITY): Payer: Self-pay

## 2018-03-28 ENCOUNTER — Other Ambulatory Visit: Payer: Self-pay

## 2018-03-28 ENCOUNTER — Encounter (HOSPITAL_COMMUNITY): Payer: Self-pay

## 2018-03-28 ENCOUNTER — Emergency Department (HOSPITAL_COMMUNITY)
Admission: EM | Admit: 2018-03-28 | Discharge: 2018-03-28 | Disposition: A | Discharge status: Home / Self Care | Payer: Self-pay | Attending: Emergency Medicine | Admitting: Emergency Medicine

## 2018-03-28 DIAGNOSIS — J45909 Unspecified asthma, uncomplicated: Secondary | ICD-10-CM | POA: Insufficient documentation

## 2018-03-28 DIAGNOSIS — M25571 Pain in right ankle and joints of right foot: Secondary | ICD-10-CM

## 2018-03-28 DIAGNOSIS — Y999 Unspecified external cause status: Secondary | ICD-10-CM | POA: Insufficient documentation

## 2018-03-28 DIAGNOSIS — Y929 Unspecified place or not applicable: Secondary | ICD-10-CM | POA: Insufficient documentation

## 2018-03-28 DIAGNOSIS — X58XXXA Exposure to other specified factors, initial encounter: Secondary | ICD-10-CM | POA: Insufficient documentation

## 2018-03-28 DIAGNOSIS — S9001XA Contusion of right ankle, initial encounter: Secondary | ICD-10-CM | POA: Insufficient documentation

## 2018-03-28 DIAGNOSIS — F17210 Nicotine dependence, cigarettes, uncomplicated: Secondary | ICD-10-CM | POA: Insufficient documentation

## 2018-03-28 DIAGNOSIS — Y939 Activity, unspecified: Secondary | ICD-10-CM | POA: Insufficient documentation

## 2018-03-28 MED ORDER — NAPROXEN 500 MG PO TABS: 500.0000 mg | ORAL_TABLET | Freq: Two times a day (BID) | ORAL | 0 refills | Status: DC

## 2018-03-28 MED ORDER — IBUPROFEN 800 MG PO TABS
800.0000 mg | ORAL_TABLET | Freq: Once | ORAL | Status: AC
  Administered 2018-03-28: 800 mg via ORAL
  Filled 2018-03-28: qty 1

## 2018-03-28 NOTE — ED Provider Notes (Signed)
[facility] EMERGENCY DEPARTMENT Provider Note   CSN: [id number] Arrival date & time: 03/28/18  0800     History   Chief Complaint Chief Complaint  Patient presents with  . Ankle Pain    HPI ALEKSI Montgomery is a 37 y.o. male with a h/o of scabies, chlamydia, alcohol use disorder, and asthma who presents to the emergency department with a chief complaint of right ankle pain, bruising, and swelling.  The patient reports that he noticed bruising on the medial aspect of his right ankle last night.  He denies pain at that time.  He reports that he went to work for 12 hours and began to notice the pain in the right ankle with associated swelling.  He is able to bear weight and ambulate, but it is painful.  He does not recall any trauma or injury to the area.  He denies fever, chills, weakness, numbness, chest pain, dyspnea, right knee pain, calf, or foot pain, recent falls, fever, or chills.  He does not take any blood thinners.  No known injuries at work.  Reports he played basketball approximately 1 week ago and developed some left knee pain after the game, but does not recall any injuries to the ankle.  No recent long travel.  No history of active cancer.  No recent immobilization.  No history of right ankle surgery or injury.  The history is provided by the patient. No language interpreter was used.    Past Medical History:  Diagnosis Date  . Alcohol abuse   . Asthma   . Chlamydia   . Constipation   . Difficult intubation   . Penile pain 02/03/2015   Normal exams x several   . Scabies   . Testicular/scrotal pain 02/03/2015   Normal exam and [place] x several.    . Thyroid disorder    during childhood    Patient Active Problem List   Diagnosis Date Noted  . Epiglottitis 07/07/2015  . Penile pain 02/03/2015  . Testicular/scrotal pain 02/03/2015    Past Surgical History:  Procedure Laterality Date  . ABCESS DRAINAGE     groin area  . CYST EXCISION     top of head  . DIRECT  LARYNGOSCOPY  07/07/2015   Procedure: DIRECT LARYNGOSCOPY;  Surgeon: Linus Salmons, MD;  Location: [facility] ORS;  Service: ENT;;  . INCISION AND DRAINAGE ABSCESS N/A 07/07/2015   Procedure: INCISION AND DRAINAGE ABSCESS;  Surgeon: Linus Salmons, MD;  Location: [facility] ORS;  Service: ENT;  Laterality: N/A;  epiglottic abscess  . RHYTIDECTOMY NECK / CHEEK / CHIN     due to accident  . TONSILLECTOMY Bilateral 06/13/2017   Procedure: TONSILLECTOMY;  Surgeon: Linus Salmons, MD;  Location: [facility] ORS;  Service: ENT;  Laterality: Bilateral;        Home Medications    Prior to Admission medications   Medication Sig Start Date End Date Taking? Authorizing Provider  naproxen (NAPROSYN) 500 MG tablet Take 1 tablet (500 mg total) by mouth 2 (two) times daily. 03/28/18   Obie Kallenbach, Coral Else, PA-C    Family History Family History  Problem Relation Age of Onset  . Skin cancer Mother   . Diabetes Mother   . Thyroid cancer Maternal Grandfather   . Heart attack Maternal Grandfather   . Diabetes Maternal Grandfather     Social History Social History   Tobacco Use  . Smoking status: Current Every Day Smoker    Packs/day: 0.50    Types: Cigarettes  .  Smokeless tobacco: Never Used  Substance Use Topics  . Alcohol use: Yes    Alcohol/week: 0.0 standard drinks    Comment: occasional  . Drug use: Not Currently    Types: Marijuana     Allergies   Patient has no known allergies.   Review of Systems Review of Systems  Constitutional: Negative for activity change, chills and fever.  Respiratory: Negative for shortness of breath.   Cardiovascular: Negative for chest pain.  Gastrointestinal: Negative for abdominal pain.  Musculoskeletal: Positive for arthralgias, gait problem, joint swelling and myalgias. Negative for back pain.  Skin: Negative for rash.  Neurological: Negative for weakness and numbness.   Physical Exam Updated Vital Signs BP 132/79 (BP Location: Right Arm)   Pulse 93   Temp  97.9 F (36.6 C) (Oral)   Resp 16   Ht 5\' 6"  (1.676 m)   Wt 131.5 kg   SpO2 100%   BMI 46.81 kg/m   Physical Exam  Constitutional: He appears well-developed. No distress.  HENT:  Head: Normocephalic.  Eyes: Conjunctivae are normal.  Neck: Neck supple.  Cardiovascular: Normal rate and regular rhythm.  No murmur heard. Pulmonary/Chest: Effort normal. No respiratory distress.  Abdominal: Soft. He exhibits no distension.  Musculoskeletal:  Ecchymosis present along the medial malleolus extending up the distal portion of the tibia on the right lower leg.  Mild edema is noted to the circumferential ankle, around the medial and lateral malleolus.  No edema of the foot or calf on the right.  TTP to the inferior medial malleolus. No tenderness to the distal fibula. No tenderness to the right Achilles tendon.  He is neurovascularly intact.  Negative Homans sign.  Full active and passive range of motion of the right ankle.  Able to move all digits on the right foot.  Able to bear weight on the bilateral lower extremities.  Ambulatory without difficulty.  Neurological: He is alert.  Skin: Skin is warm and dry. He is not diaphoretic.  Psychiatric: His behavior is normal.  Nursing note and vitals reviewed.  Right medial ankle     ED Treatments / Results  Labs (all labs ordered are listed, but only abnormal results are displayed) Labs Reviewed - No data to display  EKG None  Radiology Dg Ankle Complete Right  Result Date: 03/28/2018 CLINICAL DATA:  Medial right ankle pain and bruising since awakening last night. The pain got worse at work over night last night. No specific injury. EXAM: RIGHT ANKLE - COMPLETE 3+ VIEW COMPARISON:  None. FINDINGS: The bones are subjectively adequately mineralized. There is no acute or healing fracture. There is no lytic nor blastic lesion. The ankle joint mortise is preserved. The talar dome is intact. The talus and calcaneus exhibit no acute abnormalities.  There is mild diffuse soft tissue swelling. IMPRESSION: There is no acute bony abnormality or significant degenerative change of the right ankle. There is mild soft tissue swelling. Electronically Signed   By: David  [place] M.D.   On: 03/28/2018 09:12    Procedures Procedures (including critical care time)  Medications Ordered in ED Medications  ibuprofen (ADVIL,MOTRIN) tablet 800 mg (800 mg Oral Given 03/28/18 0854)     Initial Impression / Assessment and Plan / ED Course  I have reviewed the triage vital signs and the nursing notes.  Pertinent labs & imaging results that were available during my care of the patient were reviewed by me and considered in my medical decision making (see chart for details).  37 year old male with a h/o of scabies, chlamydia, alcohol use disorder, and asthma presenting with right ankle pain, ecchymosis, and swelling.  On exam, ecchymosis is noted over the medial malleolus and distal portion of the right fibula.  He is tender over the inferior portion of the medial malleolus.  He is not anticoagulated.  Achilles tendon is intact.  He is neurovascularly intact.  No edema.  He is ambulatory.    Initially could not identify any recent trauma, but while he was in x-ray he thinks that he may have gotten the foot caught between his nightstand and headboard.  X-ray with mild edema, but no acute fracture dislocation. DVT unlikely with Wells Criteria.  Patient declined crutches in the ED.  Pain was controlled with ibuprofen.  Will discharge with ASO, naproxen, recomendations for RICE therapy and follow-up to orthopedics if symptoms do not improve in the next week as the patient does not have a PCP.  He is hemodynamically stable and in no acute distress.  He is safe for discharge home with outpatient follow-up at this time.  Final Clinical Impressions(s) / ED Diagnoses   Final diagnoses:  Contusion of right ankle, initial encounter  Acute right ankle pain    ED  Discharge Orders         Ordered    naproxen (NAPROSYN) 500 MG tablet  2 times daily     03/28/18 0935           Frederik Pear A, PA-C 03/28/18 1610    Bethann Berkshire, MD 03/28/18 1523

## 2018-03-28 NOTE — Discharge Instructions (Signed)
Thank you for allowing me to care for you today in the Emergency Department.   To care for your ankle at home, apply an ice pack for 15 to 20 minutes as frequently as needed.  Use a cloth or a barrier to avoid applying the ice directly to the skin.  When you are sitting and resting, elevate your leg so that your toes are at the level of your nose.  This can help with swelling and pain.  Use the ankle brace provided as needed to provide stability in the joint.  Take naproxen with food, 1 tablet by mouth 2 times daily.  Avoid taking other NSAIDs such as ibuprofen, Motrin, or Advil because these medications are work the same way.  If your pain returns before your next dose of Motrin, you can also take 650 mg of Tylenol every 6 hours.  Follow-up with Dr. Romeo Apple if your symptoms do not start to improve with this regimen in the next week.  Return to the emergency department if you develop significant swelling in your right calf, if the right ankle gets red and hot to the touch, if you start to have fevers or chills, or chest pain or shortness of breath.

## 2018-03-28 NOTE — ED Triage Notes (Signed)
Pt c/o pain, swelling, and bruising to left ankle x 2 days.  Denies injury.

## 2018-03-28 NOTE — ED Notes (Signed)
ED Provider at bedside. 

## 2018-06-09 ENCOUNTER — Emergency Department: Payer: Medicaid Other

## 2018-06-09 ENCOUNTER — Other Ambulatory Visit: Payer: Self-pay

## 2018-06-09 ENCOUNTER — Encounter: Payer: Self-pay | Admitting: Emergency Medicine

## 2018-06-09 ENCOUNTER — Emergency Department
Admission: EM | Admit: 2018-06-09 | Discharge: 2018-06-09 | Disposition: A | Discharge status: Home / Self Care | Payer: Medicaid Other | Attending: Emergency Medicine | Admitting: Emergency Medicine

## 2018-06-09 DIAGNOSIS — Y9289 Other specified places as the place of occurrence of the external cause: Secondary | ICD-10-CM | POA: Insufficient documentation

## 2018-06-09 DIAGNOSIS — X58XXXA Exposure to other specified factors, initial encounter: Secondary | ICD-10-CM | POA: Insufficient documentation

## 2018-06-09 DIAGNOSIS — Y9367 Activity, basketball: Secondary | ICD-10-CM | POA: Insufficient documentation

## 2018-06-09 DIAGNOSIS — S93401A Sprain of unspecified ligament of right ankle, initial encounter: Secondary | ICD-10-CM | POA: Insufficient documentation

## 2018-06-09 DIAGNOSIS — Y998 Other external cause status: Secondary | ICD-10-CM | POA: Insufficient documentation

## 2018-06-09 MED ORDER — NAPROXEN 500 MG PO TABS: 500.0000 mg | ORAL_TABLET | Freq: Two times a day (BID) | ORAL | 0 refills | Status: DC

## 2018-06-09 NOTE — ED Notes (Signed)
Pt to ed with c/o right ankle and foot pain.  Pt states he fell several weeks ago while playing basketball and reports pain has progressively become worse especially with standing.  Pt reports walking is difficult.  Pt ambulates with limp.

## 2018-06-09 NOTE — ED Provider Notes (Signed)
[facility] Emergency Department Provider Note   ____________________________________________   First MD Initiated Contact with Patient 06/09/18 [phone]     (approximate)  I have reviewed the triage vital signs and the nursing notes.   HISTORY  Chief Complaint Ankle Pain   HPI Ronald Montgomery is a 38 y.o. male presents to the ED with complaint of ankle pain for 2 weeks.  Patient states that he was playing basketball at the time of his injury.  He has continued to ambulate without any assistance.  He has occasionally taken aspirin and ibuprofen with minimal relief.   Past Medical History:  Diagnosis Date  . Alcohol abuse   . Asthma   . Chlamydia   . Constipation   . Difficult intubation   . Penile pain 02/03/2015   Normal exams x several   . Scabies   . Testicular/scrotal pain 02/03/2015   Normal exam and [place] x several.    . Thyroid disorder    during childhood    Patient Active Problem List   Diagnosis Date Noted  . Epiglottitis 07/07/2015  . Penile pain 02/03/2015  . Testicular/scrotal pain 02/03/2015    Past Surgical History:  Procedure Laterality Date  . ABCESS DRAINAGE     groin area  . CYST EXCISION     top of head  . DIRECT LARYNGOSCOPY  07/07/2015   Procedure: DIRECT LARYNGOSCOPY;  Surgeon: Linus Salmons, MD;  Location: [facility] ORS;  Service: ENT;;  . INCISION AND DRAINAGE ABSCESS N/A 07/07/2015   Procedure: INCISION AND DRAINAGE ABSCESS;  Surgeon: Linus Salmons, MD;  Location: [facility] ORS;  Service: ENT;  Laterality: N/A;  epiglottic abscess  . RHYTIDECTOMY NECK / CHEEK / CHIN     due to accident  . TONSILLECTOMY Bilateral 06/13/2017   Procedure: TONSILLECTOMY;  Surgeon: Linus Salmons, MD;  Location: [facility] ORS;  Service: ENT;  Laterality: Bilateral;    Prior to Admission medications   Medication Sig Start Date End Date Taking? Authorizing Provider  naproxen (NAPROSYN) 500 MG tablet Take 1 tablet (500 mg total) by mouth 2 (two) times  daily with a meal. 06/09/18   Tommi Rumps, PA-C    Allergies Patient has no known allergies.  Family History  Problem Relation Age of Onset  . Skin cancer Mother   . Diabetes Mother   . Thyroid cancer Maternal Grandfather   . Heart attack Maternal Grandfather   . Diabetes Maternal Grandfather     Social History Social History   Tobacco Use  . Smoking status: Current Every Day Smoker    Packs/day: 0.50    Types: Cigarettes  . Smokeless tobacco: Never Used  Substance Use Topics  . Alcohol use: Yes    Alcohol/week: 0.0 standard drinks    Comment: occasional  . Drug use: Not Currently    Types: Marijuana    Review of Systems Constitutional: No fever/chills Cardiovascular: Denies chest pain. Respiratory: Denies shortness of breath. Musculoskeletal: Positive for right ankle pain. Skin: Negative for rash. Neurological: Negative for  focal weakness or numbness. ___________________________________________   PHYSICAL EXAM:  VITAL SIGNS: ED Triage Vitals  Enc Vitals Group     BP 06/09/18 0710 (!) 148/98     Pulse Rate 06/09/18 0710 87     Resp 06/09/18 0708 18     Temp 06/09/18 0710 98.8 F (37.1 C)     Temp Source 06/09/18 0708 Oral     SpO2 06/09/18 0710 96 %     Weight  06/09/18 0710 280 lb (127 kg)     Height 06/09/18 0710 5\' 6"  (1.676 m)     Head Circumference --      Peak Flow --      Pain Score 06/09/18 0710 5     Pain Loc --      Pain Edu? --      Excl. in GC? --     Constitutional: Alert and oriented. Well appearing and in no acute distress. Eyes: Conjunctivae are normal.  Head: Atraumatic. Neck: No stridor.   Cardiovascular: Normal rate, regular rhythm. Grossly normal heart sounds.  Good peripheral circulation. Respiratory: Normal respiratory effort.  No retractions. Lungs CTAB. Gastrointestinal: Soft and nontender. No distention.  Obese. Musculoskeletal: Examination of the right ankle there is tenderness on palpation of the medial aspect  without gross deformity.  There is minimal soft tissue swelling bilaterally.  Minimal tenderness on palpation of the Achilles tendon without soft tissue edema or injury noted.  Thompson sign is negative for Achilles rupture.  Pulses present.  Motor sensory function intact.  No resolving ecchymosis or abrasions are seen. Neurologic:  Normal speech and language. No gross focal neurologic deficits are appreciated.  Skin:  Skin is warm, dry and intact. No rash noted. Psychiatric: Mood and affect are normal. Speech and behavior are normal.  ____________________________________________   LABS (all labs ordered are listed, but only abnormal results are displayed)  Labs Reviewed - No data to display   RADIOLOGY  ED MD interpretation:  Right ankle x-ray is negative for acute bony injury.  Official radiology report(s): Dg Ankle Complete Right  Result Date: 06/09/2018 CLINICAL DATA:  Right pain radiating up the right leg. Initial encounter. EXAM: RIGHT ANKLE - COMPLETE 3+ VIEW COMPARISON:  None. FINDINGS: Normal anatomic alignment. No evidence for acute fracture or dislocation. Talar dome is intact. Regional soft tissues unremarkable. Posterior calcaneal spurring. IMPRESSION: No acute osseous abnormality. Electronically Signed   By: Annia Beltrew  Davis M.D.   On: 06/09/2018 07:59    ____________________________________________   PROCEDURES  Procedure(s) performed: Ace wrap was applied by RN Vikki PortsValerie.  Procedures  Critical Care performed: No  ____________________________________________   INITIAL IMPRESSION / ASSESSMENT AND PLAN / ED COURSE  As part of my medical decision making, I reviewed the following data within the electronic MEDICAL RECORD NUMBER Notes from prior ED visits and [place] Controlled Substance Database  Patient presents to the ED with complaint of right ankle pain from an injury 2 weeks ago while playing basketball.  He is infrequently taken aspirin and ibuprofen.  He continues to  ambulate without any assistance.  He states it is worse when he is at work.  Physical exam was unremarkable.  X-rays confirmed that there was no bony injury.  Patient was made aware.  He was placed in an Ace wrap and given a prescription for naproxen 500 g twice daily with food.  He is to follow-up with Dr. Ether GriffinsFowler who is on-call for podiatry if he continues to have problems.  ____________________________________________   FINAL CLINICAL IMPRESSION(S) / ED DIAGNOSES  Final diagnoses:  Sprain of right ankle, unspecified ligament, initial encounter     ED Discharge Orders         Ordered    naproxen (NAPROSYN) 500 MG tablet  2 times daily with meals     06/09/18 0813           Note:  This document was prepared using Dragon voice recognition software and may include unintentional dictation errors.  Tommi RumpsSummers, Rhonda L, PA-C 06/09/18 1157    Jeanmarie PlantMcShane, James A, MD 06/09/18 [phone]

## 2018-06-09 NOTE — Discharge Instructions (Addendum)
Follow-up with Dr. Ether Griffins if any continued problems with your ankle.  Begin taking naproxen 500 mg twice daily every day with food.  Ice as needed for pain.  Wear Ace wrap when you are up walking.  You do not need to wear it when you are sleeping.  Wear supportive shoes anytime you are walking.

## 2018-06-09 NOTE — ED Triage Notes (Signed)
R ankle pain x 2 weeks. States original injury while playing basketball.

## 2018-06-09 NOTE — ED Notes (Addendum)
Ace wrap applied to right ankle. Educated pt on use and symptoms to watch for if too tight and how to apply. Verbalized understanding.

## 2018-09-12 ENCOUNTER — Encounter: Payer: Self-pay | Admitting: Emergency Medicine

## 2018-09-12 ENCOUNTER — Emergency Department: Payer: Self-pay

## 2018-09-12 ENCOUNTER — Emergency Department
Admission: EM | Admit: 2018-09-12 | Discharge: 2018-09-12 | Disposition: A | Discharge status: Home / Self Care | Payer: Self-pay | Attending: Emergency Medicine | Admitting: Emergency Medicine

## 2018-09-12 ENCOUNTER — Other Ambulatory Visit: Payer: Self-pay

## 2018-09-12 DIAGNOSIS — R1030 Lower abdominal pain, unspecified: Secondary | ICD-10-CM | POA: Insufficient documentation

## 2018-09-12 DIAGNOSIS — R109 Unspecified abdominal pain: Secondary | ICD-10-CM

## 2018-09-12 DIAGNOSIS — J45909 Unspecified asthma, uncomplicated: Secondary | ICD-10-CM | POA: Insufficient documentation

## 2018-09-12 DIAGNOSIS — R7401 Elevation of levels of liver transaminase levels: Secondary | ICD-10-CM

## 2018-09-12 DIAGNOSIS — R740 Nonspecific elevation of levels of transaminase and lactic acid dehydrogenase [LDH]: Secondary | ICD-10-CM | POA: Insufficient documentation

## 2018-09-12 DIAGNOSIS — F17210 Nicotine dependence, cigarettes, uncomplicated: Secondary | ICD-10-CM | POA: Insufficient documentation

## 2018-09-12 DIAGNOSIS — E119 Type 2 diabetes mellitus without complications: Secondary | ICD-10-CM | POA: Insufficient documentation

## 2018-09-12 LAB — CBC WITH DIFFERENTIAL/PLATELET
Abs Immature Granulocytes: 0.05 10*3/uL (ref 0.00–0.07)
Basophils Absolute: 0 10*3/uL (ref 0.0–0.1)
Basophils Relative: 1 %
Eosinophils Absolute: 0.1 10*3/uL (ref 0.0–0.5)
Eosinophils Relative: 1 %
HCT: 47.4 % (ref 39.0–52.0)
Hemoglobin: 16 g/dL (ref 13.0–17.0)
Immature Granulocytes: 1 %
Lymphocytes Relative: 15 %
Lymphs Abs: 1.2 10*3/uL (ref 0.7–4.0)
MCH: 32.3 pg (ref 26.0–34.0)
MCHC: 33.8 g/dL (ref 30.0–36.0)
MCV: 95.8 fL (ref 80.0–100.0)
Monocytes Absolute: 0.4 10*3/uL (ref 0.1–1.0)
Monocytes Relative: 5 %
Neutro Abs: 6.2 10*3/uL (ref 1.7–7.7)
Neutrophils Relative %: 77 %
Platelets: 176 10*3/uL (ref 150–400)
RBC: 4.95 MIL/uL (ref 4.22–5.81)
RDW: 12.5 % (ref 11.5–15.5)
WBC: 8 10*3/uL (ref 4.0–10.5)
nRBC: 0 % (ref 0.0–0.2)

## 2018-09-12 LAB — COMPREHENSIVE METABOLIC PANEL
ALT: 141 U/L — ABNORMAL HIGH (ref 0–44)
AST: 146 U/L — ABNORMAL HIGH (ref 15–41)
Albumin: 3.7 g/dL (ref 3.5–5.0)
Alkaline Phosphatase: 82 U/L (ref 38–126)
Anion gap: 10 (ref 5–15)
BUN: 14 mg/dL (ref 6–20)
CO2: 24 mmol/L (ref 22–32)
Calcium: 8.5 mg/dL — ABNORMAL LOW (ref 8.9–10.3)
Chloride: 102 mmol/L (ref 98–111)
Creatinine, Ser: 1.14 mg/dL (ref 0.61–1.24)
GFR calc Af Amer: >60 mL/min (ref >60)
GFR calc non Af Amer: >60 mL/min (ref >60)
Glucose, Bld: 342 mg/dL — ABNORMAL HIGH (ref 70–99)
Potassium: 3.8 mmol/L (ref 3.5–5.1)
Sodium: 136 mmol/L (ref 135–145)
Total Bilirubin: 0.5 mg/dL (ref 0.3–1.2)
Total Protein: 7.4 g/dL (ref 6.5–8.1)

## 2018-09-12 LAB — URINALYSIS, COMPLETE (UACMP) WITH MICROSCOPIC
Bacteria, UA: NONE SEEN
Bilirubin Urine: NEGATIVE
Glucose, UA: >=500 mg/dL — AB
Ketones, ur: NEGATIVE mg/dL
Leukocytes,Ua: NEGATIVE
Nitrite: NEGATIVE
Protein, ur: NEGATIVE mg/dL
Specific Gravity, Urine: 1.013 (ref 1.005–1.030)
pH: 5 (ref 5.0–8.0)

## 2018-09-12 LAB — GLUCOSE, CAPILLARY: Glucose-Capillary: 204 mg/dL — ABNORMAL HIGH (ref 70–99)

## 2018-09-12 LAB — LIPASE, BLOOD: Lipase: 38 U/L (ref 11–51)

## 2018-09-12 MED ORDER — ONDANSETRON HCL 4 MG/2ML IJ SOLN
4.0000 mg | Freq: Once | INTRAMUSCULAR | Status: AC
  Administered 2018-09-12: 06:00:00 4 mg via INTRAVENOUS
  Filled 2018-09-12: qty 2

## 2018-09-12 MED ORDER — MORPHINE SULFATE (PF) 4 MG/ML IV SOLN
4.0000 mg | Freq: Once | INTRAVENOUS | Status: AC
  Administered 2018-09-12: 06:00:00 4 mg via INTRAVENOUS
  Filled 2018-09-12: qty 1

## 2018-09-12 MED ORDER — METFORMIN HCL 500 MG PO TABS: 500.0000 mg | ORAL_TABLET | Freq: Two times a day (BID) | ORAL | 1 refills | Status: DC

## 2018-09-12 MED ORDER — IOHEXOL 300 MG/ML  SOLN
100.0000 mL | Freq: Once | INTRAMUSCULAR | Status: AC | PRN
  Administered 2018-09-12: 08:00:00 100 mL via INTRAVENOUS

## 2018-09-12 MED ORDER — IOHEXOL 240 MG/ML SOLN
50.0000 mL | Freq: Once | INTRAMUSCULAR | Status: AC | PRN
  Administered 2018-09-12: 50 mL via ORAL

## 2018-09-12 MED ORDER — SODIUM CHLORIDE 0.9 % IV BOLUS
1000.0000 mL | Freq: Once | INTRAVENOUS | Status: AC
  Administered 2018-09-12: 06:00:00 1000 mL via INTRAVENOUS

## 2018-09-12 NOTE — ED Triage Notes (Addendum)
Patient ambulatory to triage with steady gait, without difficulty or distress noted; pt reports since Saturday having bilat flank pain, now with lower abd pain, no accomp symptoms

## 2018-09-12 NOTE — Discharge Instructions (Addendum)
As I explained to you, your liver enzymes are elevated which is most likely due to your alcohol use. Your labs also show you have new onset diabetes. Avoid alcohol, diet and exercise to help keep your diabetes in check and allow your liver to heal. Follow up with primary care in the next week for further management of your diabetes and re-evaluation of your liver. Return to the ER for new or worsening abdominal pain, fever, chest pain, shortness of breath. Take metformin twice a day as prescribed everyday for your sugars.

## 2018-09-12 NOTE — ED Notes (Signed)
Pt resting comfortably in bed, states pain is better but still at a 2, bed locked and in lowest position, call bell in reach

## 2018-09-12 NOTE — ED Provider Notes (Addendum)
-----------------------------------------   7:29 AM on 09/12/2018 -----------------------------------------   Blood pressure (!) 150/104, pulse 80, temperature 98.3 F (36.8 C), resp. rate 20, height 5\' 6"  (1.676 m), weight 131.5 kg, SpO2 99 %.  Assuming care from Dr. Dolores Frame of [facility] is a 38 y.o. male with a chief complaint of Abdominal Pain and Flank Pain .    Please refer to H&P by previous MD for further details.  The current plan of care is to.   Clinical Course as of Sep 12 927  Wed Sep 12, 2018  0726 Care transferred to Dr. Don Perking at change of shift. Pending result of CT scan and disposition.   [JS]    Clinical Course User Index [JS] Irean Hong, MD      _________________________ 9:29 AM on 09/12/2018 ----------------------------------------- CT negative. Labs showing mildly elevated LFTs. Patient has a h/o alcohol abuse. No abdominal tenderness on the RUQ or epigastric region. Upon my evaluation patient had no tenderness throughout his abdomen. Discussed alcohol cessation with patient and f/u for repeat LFTs with PCP. Labs also consistent with new onset DM. Patient with strong family history. BG trended down after IVF. Patient started on metformin. Counseling and materials about diet, exercise, and basics of diabetes provided to patient. Discussed my standard return precautions with patient and recommended close f/u for DM care.     I have personally reviewed the images performed during this visit and I agree with the Radiologist's read.   Interpretation by Radiologist:  Ct Abdomen Pelvis W Contrast  Result Date: 09/12/2018 CLINICAL DATA:  Bilateral flank and lower abdominal pain for 4 days. EXAM: CT ABDOMEN AND PELVIS WITH CONTRAST TECHNIQUE: Multidetector CT imaging of the abdomen and pelvis was performed using the standard protocol following bolus administration of intravenous contrast. CONTRAST:  OMNIPAQUE IOHEXOL 300 MG/ML  SOLN COMPARISON:  None.  FINDINGS: Lower Chest: No acute findings. Hepatobiliary: No hepatic masses identified. Severe diffuse hepatic steatosis, with focal fatty sparing seen in the central left lobe adjacent to the porta hepatis. Gallbladder is unremarkable. No evidence of biliary ductal dilatation. Pancreas:  No mass or inflammatory changes. Spleen: Within normal limits in size and appearance. Adrenals/Urinary Tract: No masses identified. No evidence of urolithiasis or hydronephrosis. Stomach/Bowel: No evidence of obstruction, inflammatory process or abnormal fluid collections. Normal appendix visualized. Vascular/Lymphatic: No pathologically enlarged lymph nodes. No abdominal aortic aneurysm. Reproductive:  No mass or other significant abnormality. Other:  None. Musculoskeletal:  No suspicious bone lesions identified. IMPRESSION: 1. No acute findings within the abdomen or pelvis. 2. Severe hepatic steatosis. Electronically Signed   By: Myles Rosenthal M.D.   On: 09/12/2018 08:12      Nita Sickle, MD 09/12/18 5732    Nita Sickle, MD 09/12/18 [phone]

## 2018-09-12 NOTE — ED Provider Notes (Signed)
[facility] Emergency Department Provider Note   ____________________________________________   First MD Initiated Contact with Patient 09/12/18 [phone]     (approximate)  I have reviewed the triage vital signs and the nursing notes.   HISTORY  Chief Complaint Abdominal Pain and Flank Pain    HPI Ronald Montgomery is a 38 y.o. male who presents to the ED from home with a chief complaint of bilateral flank and lower abdominal pain.  Symptoms started 4 days ago.  Denies associated fever, cough, chest pain, shortness of breath, nausea, vomiting, dysuria or diarrhea.  Denies recent travel, trauma or exposure to persons diagnosed with coronavirus.       Past Medical History:  Diagnosis Date  . Alcohol abuse   . Asthma   . Chlamydia   . Constipation   . Difficult intubation   . Penile pain 02/03/2015   Normal exams x several   . Scabies   . Testicular/scrotal pain 02/03/2015   Normal exam and US x several.    . Thyroid disorder    during childhood    Patient Active Problem List   Diagnosis Date Noted  . Epiglottitis 07/07/2015  . Penile pain 02/03/2015  . Testicular/scrotal pain 02/03/2015    Past Surgical History:  Procedure Laterality Date  . ABCESS DRAINAGE     groin area  . CYST EXCISION     top of head  . DIRECT LARYNGOSCOPY  07/07/2015   Procedure: DIRECT LARYNGOSCOPY;  Surgeon: Linus Salmonshapman McQueen, MD;  Location: [facility] ORS;  Service: ENT;;  . INCISION AND DRAINAGE ABSCESS N/A 07/07/2015   Procedure: INCISION AND DRAINAGE ABSCESS;  Surgeon: Linus Salmonshapman McQueen, MD;  Location: [facility] ORS;  Service: ENT;  Laterality: N/A;  epiglottic abscess  . RHYTIDECTOMY NECK / CHEEK / CHIN     due to accident  . TONSILLECTOMY Bilateral 06/13/2017   Procedure: TONSILLECTOMY;  Surgeon: Linus SalmonsMcQueen, Chapman, MD;  Location: [facility] ORS;  Service: ENT;  Laterality: Bilateral;    Prior to Admission medications   Medication Sig Start Date End Date Taking? Authorizing Provider   naproxen (NAPROSYN) 500 MG tablet Take 1 tablet (500 mg total) by mouth 2 (two) times daily with a meal. 06/09/18   Tommi RumpsSummers, Rhonda L, PA-C    Allergies Patient has no known allergies.  Family History  Problem Relation Age of Onset  . Skin cancer Mother   . Diabetes Mother   . Thyroid cancer Maternal Grandfather   . Heart attack Maternal Grandfather   . Diabetes Maternal Grandfather     Social History Social History   Tobacco Use  . Smoking status: Current Every Day Smoker    Packs/day: 0.50    Types: Cigarettes  . Smokeless tobacco: Never Used  Substance Use Topics  . Alcohol use: Yes    Alcohol/week: 0.0 standard drinks    Comment: occasional  . Drug use: Not Currently    Types: Marijuana    Review of Systems  Constitutional: No fever/chills Eyes: No visual changes. ENT: No sore throat. Cardiovascular: Denies chest pain. Respiratory: Denies shortness of breath. Gastrointestinal: Positive for bilateral flank and bilateral lower abdominal pain.  No nausea, no vomiting.  No diarrhea.  No constipation. Genitourinary: Negative for dysuria. Musculoskeletal: Negative for back pain. Skin: Negative for rash. Neurological: Negative for headaches, focal weakness or numbness.   ____________________________________________   PHYSICAL EXAM:  VITAL SIGNS: ED Triage Vitals  Enc Vitals Group     BP 09/12/18 0546 (!) 151/98  Pulse Rate 09/12/18 0546 98     Resp 09/12/18 0546 20     Temp 09/12/18 0546 98.3 F (36.8 C)     Temp src --      SpO2 09/12/18 0546 100 %     Weight 09/12/18 0544 290 lb (131.5 kg)     Height 09/12/18 0544  (1.676 m)     Head Circumference --      Peak Flow --      Pain Score 09/12/18 0543 6     Pain Loc --      Pain Edu? --      Excl. in GC? --     Constitutional: Alert and oriented. Well appearing and in no acute distress. Eyes: Conjunctivae are normal. PERRL. EOMI. Head: Atraumatic. Nose: No congestion/rhinnorhea.  Mouth/Throat: Mucous membranes are moist.  Oropharynx non-erythematous. Neck: No stridor.   Cardiovascular: Normal rate, regular rhythm. Grossly normal heart sounds.  Good peripheral circulation. Respiratory: Normal respiratory effort.  No retractions. Lungs CTAB. Gastrointestinal: Soft and mildly tender to palpation lower abdomen without rebound or guarding. No distention. No abdominal bruits. No CVA tenderness. Musculoskeletal: No lower extremity tenderness nor edema.  No joint effusions. Neurologic:  Normal speech and language. No gross focal neurologic deficits are appreciated. No gait instability. Skin:  Skin is warm, dry and intact. No rash noted. Psychiatric: Mood and affect are normal. Speech and behavior are normal.  ____________________________________________   LABS (all labs ordered are listed, but only abnormal results are displayed)  Labs Reviewed - No data to display ____________________________________________  EKG  None ____________________________________________  RADIOLOGY  ED MD interpretation:  Pending  Official radiology report(s): No results found.  ____________________________________________   PROCEDURES  Procedure(s) performed (including Critical Care):  Procedures   ____________________________________________   INITIAL IMPRESSION / ASSESSMENT AND PLAN / ED COURSE  As part of my medical decision making, I reviewed the following data within the electronic MEDICAL RECORD NUMBER Nursing notes reviewed and incorporated, Labs reviewed, Old chart reviewed and Notes from prior ED visits        38 year old male who presents with bilateral flank and lower abdominal pain. Differential diagnosis includes, but is not limited to, acute appendicitis, renal colic, testicular torsion, urinary tract infection/pyelonephritis, prostatitis,  epididymitis, diverticulitis, small bowel obstruction or ileus, colitis, abdominal aortic aneurysm, gastroenteritis, hernia,  etc.  Ronald Cutter was evaluated in Emergency Department on 09/12/2018 for the symptoms described in the history of present illness. He was evaluated in the context of the global COVID-19 pandemic, which necessitated consideration that the patient might be at risk for infection with the SARS-CoV-2 virus that causes COVID-19. Institutional protocols and algorithms that pertain to the evaluation of patients at risk for COVID-19 are in a state of rapid change based on information released by regulatory bodies including the CDC and federal and state organizations. These policies and algorithms were followed during the patient's care in the ED.  Will obtain basic lab work, urinalysis and proceed with CT abdomen/pelvis.  Clinical Course as of Sep 11 725  Wed Sep 12, 2018  0726 Care transferred to Dr. Don Perking at change of shift. Pending result of CT scan and disposition.   [JS]    Clinical Course User Index [JS] Irean Hong, MD     ____________________________________________   FINAL CLINICAL IMPRESSION(S) / ED DIAGNOSES  Final diagnoses:  None     ED Discharge Orders    None       Note:  This  document was prepared using Conservation officer, historic buildings and may include unintentional dictation errors.   Irean Hong, MD 09/12/18 [phone]

## 2018-10-02 ENCOUNTER — Other Ambulatory Visit: Payer: Self-pay

## 2018-10-02 ENCOUNTER — Emergency Department
Admission: EM | Admit: 2018-10-02 | Discharge: 2018-10-02 | Disposition: A | Discharge status: Home / Self Care | Payer: HRSA Program | Attending: Emergency Medicine | Admitting: Emergency Medicine

## 2018-10-02 ENCOUNTER — Emergency Department: Payer: HRSA Program

## 2018-10-02 DIAGNOSIS — J45909 Unspecified asthma, uncomplicated: Secondary | ICD-10-CM | POA: Insufficient documentation

## 2018-10-02 DIAGNOSIS — F17210 Nicotine dependence, cigarettes, uncomplicated: Secondary | ICD-10-CM | POA: Insufficient documentation

## 2018-10-02 DIAGNOSIS — Z79899 Other long term (current) drug therapy: Secondary | ICD-10-CM | POA: Insufficient documentation

## 2018-10-02 DIAGNOSIS — Z20828 Contact with and (suspected) exposure to other viral communicable diseases: Secondary | ICD-10-CM | POA: Diagnosis not present

## 2018-10-02 DIAGNOSIS — J40 Bronchitis, not specified as acute or chronic: Secondary | ICD-10-CM | POA: Diagnosis not present

## 2018-10-02 DIAGNOSIS — R05 Cough: Secondary | ICD-10-CM | POA: Diagnosis present

## 2018-10-02 LAB — SARS CORONAVIRUS 2 BY RT PCR (HOSPITAL ORDER, PERFORMED IN ~~LOC~~ HOSPITAL LAB): SARS Coronavirus 2: NEGATIVE

## 2018-10-02 MED ORDER — GUAIFENESIN-CODEINE 100-10 MG/5ML PO SOLN: 5.0000 mL | Freq: Four times a day (QID) | ORAL | 0 refills | Status: DC | PRN

## 2018-10-02 MED ORDER — DOXYCYCLINE HYCLATE 100 MG PO TABS: 100.0000 mg | ORAL_TABLET | Freq: Two times a day (BID) | ORAL | 0 refills | Status: DC

## 2018-10-02 NOTE — ED Notes (Signed)
Pt states side pain from coughing

## 2018-10-02 NOTE — ED Triage Notes (Signed)
Pt c/o cough with SOB and body aches for the past 5-6 days. Pt is in NAD on arrival

## 2018-10-02 NOTE — ED Provider Notes (Signed)
[facility] Emergency Department Provider Note  Time seen: 5:29 PM  I have reviewed the triage vital signs and the nursing notes.   HISTORY  Chief Complaint Cough   HPI Ronald Montgomery is a 38 y.o. male with a past medical history alcohol use, bronchitis, presents emergency department for a dry cough.  According to the patient over the past 5 days he has had a dry cough, worse today, could not go to work so he came to the emergency department.  Patient states his son is also coughing at home but denies any fever with himself or his son.  Denies any congestion.  States a history of bronchitis in the past which this feels somewhat similar.  Patient denies any vomiting or diarrhea.  Does state frequent cough and is not able to sleep.  Frequent dry cough during our exam.  Past Medical History:  Diagnosis Date  . Alcohol abuse   . Asthma   . Chlamydia   . Constipation   . Difficult intubation   . Penile pain 02/03/2015   Normal exams x several   . Scabies   . Testicular/scrotal pain 02/03/2015   Normal exam and [place] x several.    . Thyroid disorder    during childhood    Patient Active Problem List   Diagnosis Date Noted  . Epiglottitis 07/07/2015  . Penile pain 02/03/2015  . Testicular/scrotal pain 02/03/2015    Past Surgical History:  Procedure Laterality Date  . ABCESS DRAINAGE     groin area  . CYST EXCISION     top of head  . DIRECT LARYNGOSCOPY  07/07/2015   Procedure: DIRECT LARYNGOSCOPY;  Surgeon: Linus Salmons, MD;  Location: [facility] ORS;  Service: ENT;;  . INCISION AND DRAINAGE ABSCESS N/A 07/07/2015   Procedure: INCISION AND DRAINAGE ABSCESS;  Surgeon: Linus Salmons, MD;  Location: [facility] ORS;  Service: ENT;  Laterality: N/A;  epiglottic abscess  . RHYTIDECTOMY NECK / CHEEK / CHIN     due to accident  . TONSILLECTOMY Bilateral 06/13/2017   Procedure: TONSILLECTOMY;  Surgeon: Linus Salmons, MD;  Location: [facility] ORS;  Service: ENT;  Laterality:  Bilateral;    Prior to Admission medications   Medication Sig Start Date End Date Taking? Authorizing Provider  metFORMIN (GLUCOPHAGE) 500 MG tablet Take 1 tablet (500 mg total) by mouth 2 (two) times daily with a meal. 09/12/18   Don Perking, [place], MD  naproxen (NAPROSYN) 500 MG tablet Take 1 tablet (500 mg total) by mouth 2 (two) times daily with a meal. 06/09/18   Bridget Hartshorn L, PA-C    No Known Allergies  Family History  Problem Relation Age of Onset  . Skin cancer Mother   . Diabetes Mother   . Thyroid cancer Maternal Grandfather   . Heart attack Maternal Grandfather   . Diabetes Maternal Grandfather     Social History Social History   Tobacco Use  . Smoking status: Current Every Day Smoker    Packs/day: 0.50    Types: Cigarettes  . Smokeless tobacco: Never Used  Substance Use Topics  . Alcohol use: Yes    Alcohol/week: 0.0 standard drinks    Comment: occasional  . Drug use: Not Currently    Types: Marijuana    Review of Systems Constitutional: Negative for fever. ENT: Negative for recent illness/congestion Cardiovascular: Negative for chest pain. Respiratory: Negative for shortness of breath.  Positive for dry cough. Gastrointestinal: Negative for abdominal pain, vomiting Musculoskeletal: Negative for musculoskeletal complaints  Neurological: Negative for headache All other ROS negative  ____________________________________________   PHYSICAL EXAM:  VITAL SIGNS: ED Triage Vitals [10/02/18 1702]  Enc Vitals Group     BP      Pulse      Resp      Temp      Temp src      SpO2      Weight 280 lb (127 kg)     Height 5\' 6"  (1.676 m)     Head Circumference      Peak Flow      Pain Score 3     Pain Loc      Pain Edu?      Excl. in GC?    Constitutional: Alert and oriented. Well appearing and in no distress. Eyes: Normal exam ENT      Head: Normocephalic and atraumatic.      Mouth/Throat: Mucous membranes are moist. Cardiovascular: Normal rate,  regular rhythm.  Respiratory: Normal respiratory effort without tachypnea nor retractions. Breath sounds are clear  Gastrointestinal: Soft and nontender. No distention.   Musculoskeletal: Nontender with normal range of motion in all extremities.  Neurologic:  Normal speech and language. No gross focal neurologic deficits Skin:  Skin is warm, dry and intact.  Psychiatric: Mood and affect are normal.   ____________________________________________    RADIOLOGY  Chest x-ray negative  ____________________________________________   INITIAL IMPRESSION / ASSESSMENT AND PLAN / ED COURSE  Pertinent labs & imaging results that were available during my care of the patient were reviewed by me and considered in my medical decision making (see chart for details).   Patient presents to the emergency department for dry cough x5 days.  Cannot go to work today so he came to the emergency department.  Differential would include bronchitis, respiratory infection, viral infection, pneumonia.  Overall the patient appears well, clear lung sounds, frequent dry cough.  We will obtain a chest x-ray as a precaution.  His lungs the patient's chest x-ray and vitals are normal anticipate likely discharge home.  We will swab for coronavirus as a precaution, outpatient testing.  Patient agreeable to plan of care.  Will provide a work note for today and tomorrow.  I discussed quarantine/isolation precautions.  X-ray negative.  Patient continues to appear well.  We will discharge on doxycycline and codeine cough medication.  Patient to follow-up with his doctor.  Discussed isolation precautions.  Ronald Montgomery was evaluated in Emergency Department on 10/02/2018 for the symptoms described in the history of present illness. He was evaluated in the context of the global COVID-19 pandemic, which necessitated consideration that the patient might be at risk for infection with the SARS-CoV-2 virus that causes COVID-19. Institutional  protocols and algorithms that pertain to the evaluation of patients at risk for COVID-19 are in a state of rapid change based on information released by regulatory bodies including the CDC and federal and state organizations. These policies and algorithms were followed during the patient's care in the ED.  ____________________________________________   FINAL CLINICAL IMPRESSION(S) / ED DIAGNOSES  Bronchitis   Minna AntisPaduchowski, [facility], MD 10/02/18 1827

## 2019-01-25 IMAGING — DX DG ANKLE COMPLETE 3+V*R*
3 series · 3 of 3 positions shown · non-contrast
Comparison: None.

CLINICAL DATA: Right pain radiating up the right leg. Initial
encounter.

EXAM:
RIGHT ANKLE - COMPLETE 3+ VIEW

[ankle ap]
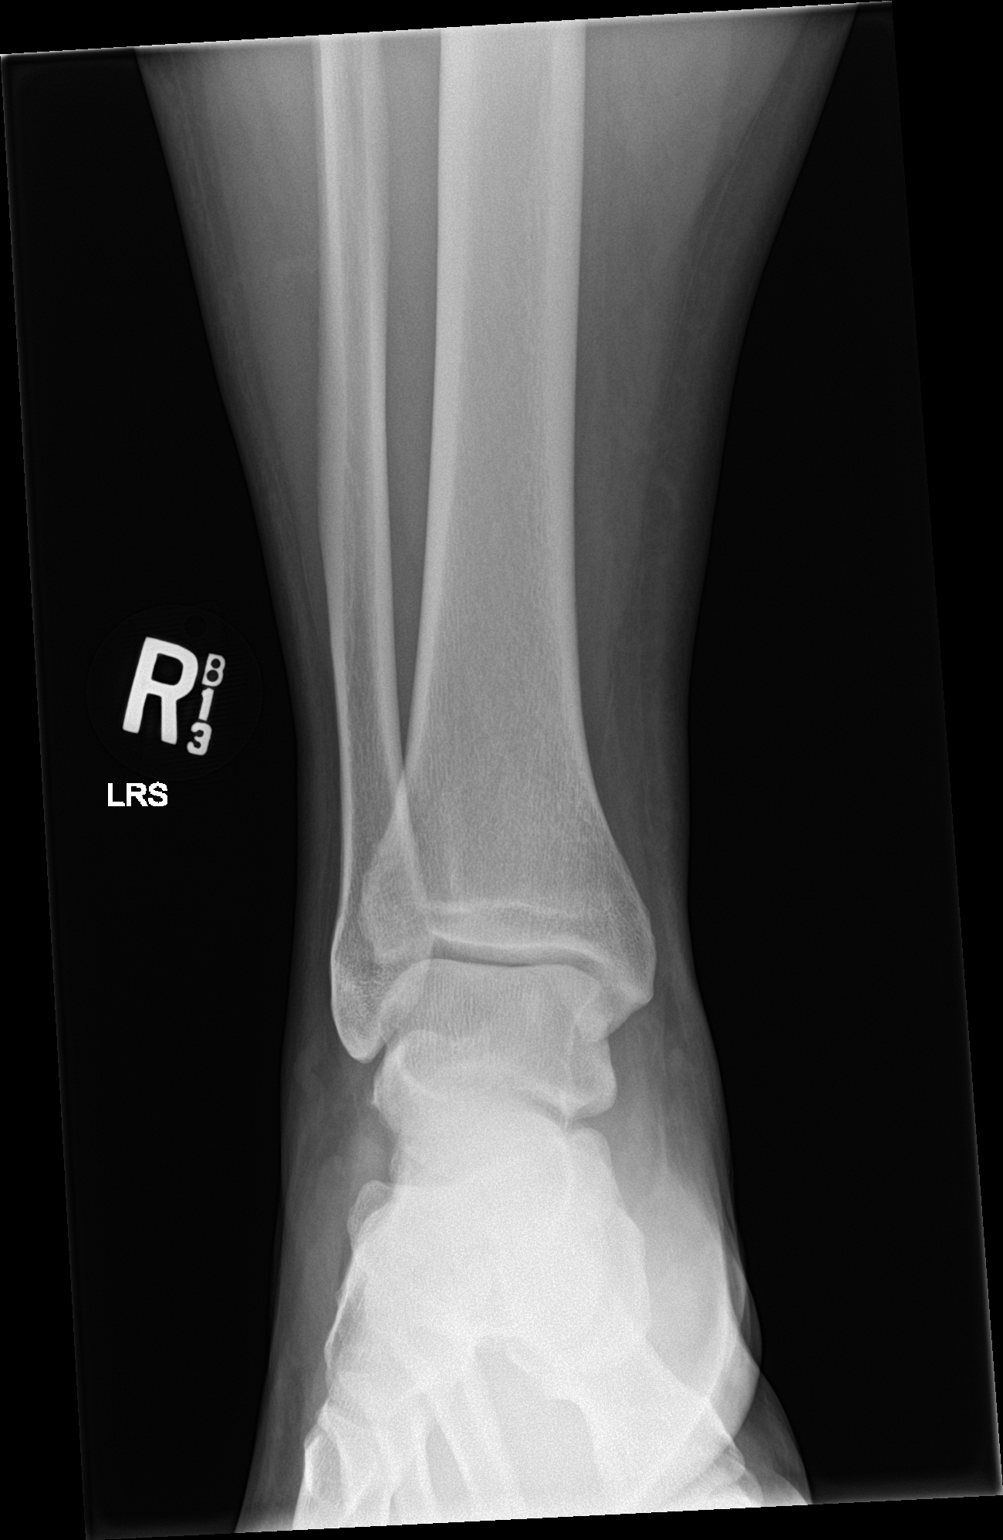

[ankle obl]
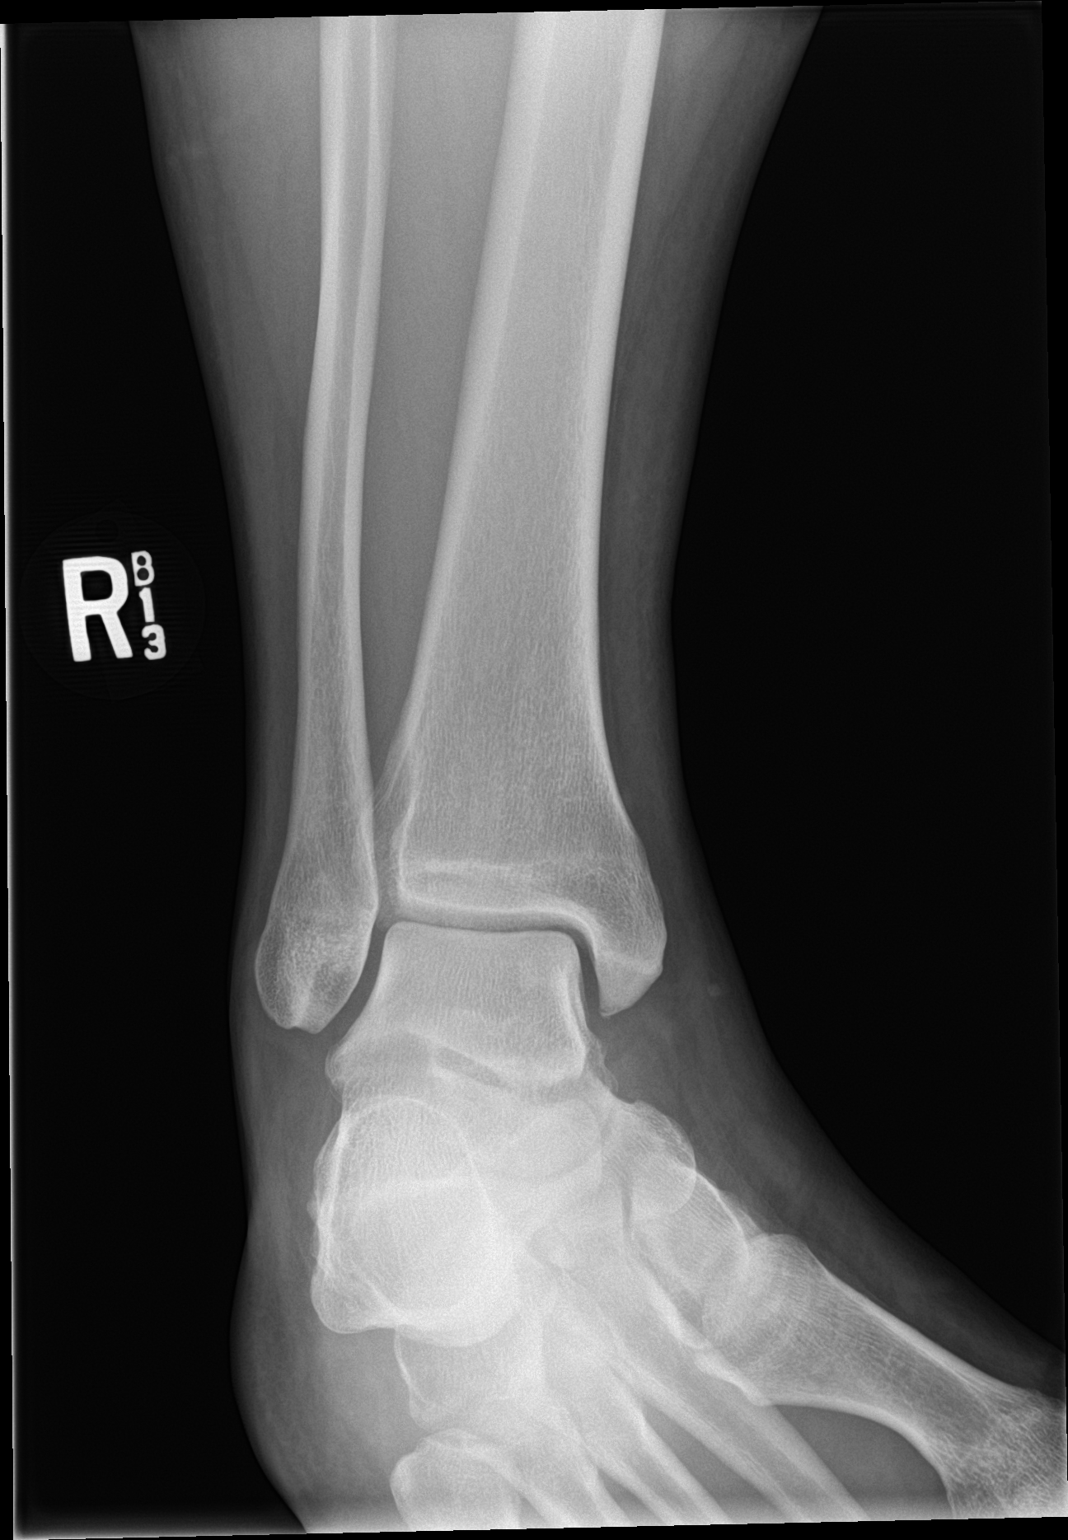

[ankle lat]
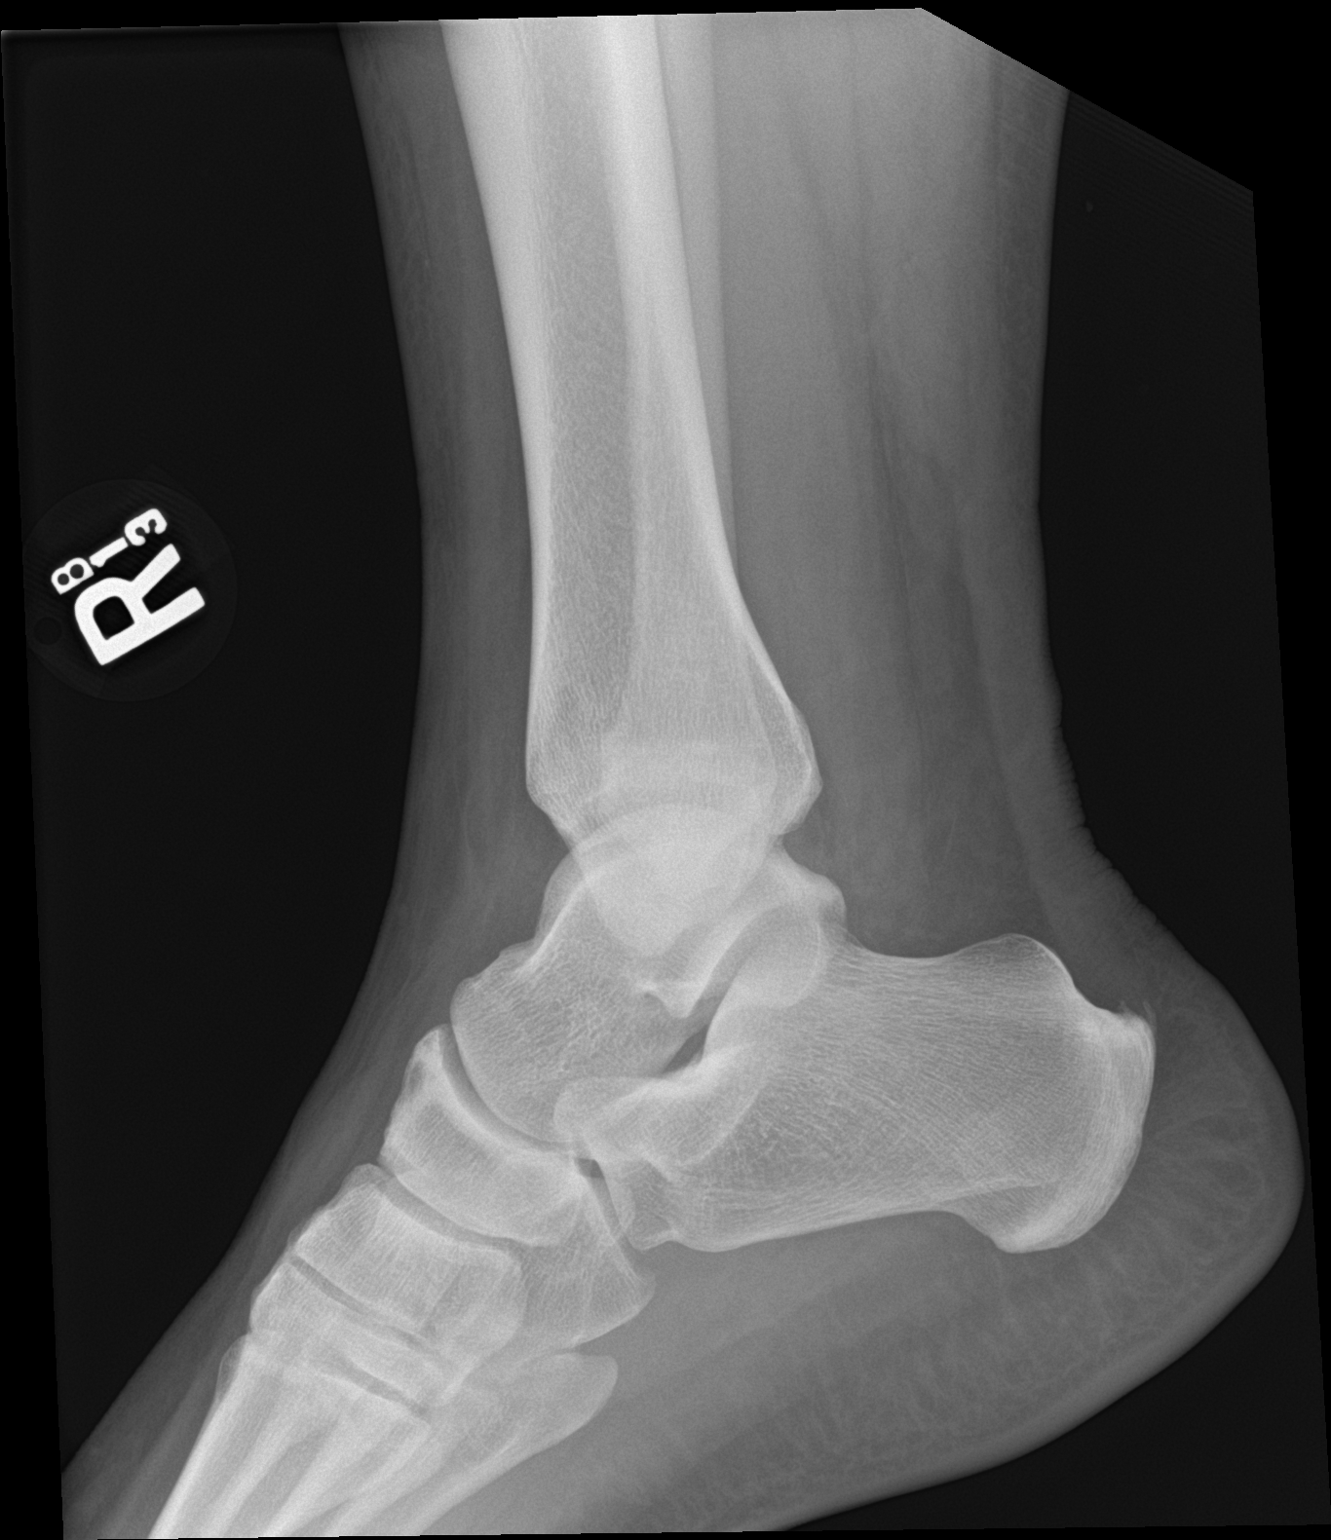

[3 of 3 positions shown; findings below may reference images not displayed]

FINDINGS: Normal anatomic alignment. No evidence for acute fracture or
dislocation. Talar dome is intact. Regional soft tissues
unremarkable. Posterior calcaneal spurring.
IMPRESSION: No acute osseous abnormality.

## 2019-02-25 ENCOUNTER — Encounter: Payer: Self-pay | Admitting: *Deleted

## 2019-02-25 ENCOUNTER — Emergency Department
Admission: EM | Admit: 2019-02-25 | Discharge: 2019-02-25 | Disposition: A | Discharge status: Home / Self Care | Payer: Medicaid Other | Attending: Emergency Medicine | Admitting: Emergency Medicine

## 2019-02-25 ENCOUNTER — Other Ambulatory Visit: Payer: Self-pay

## 2019-02-25 DIAGNOSIS — J45909 Unspecified asthma, uncomplicated: Secondary | ICD-10-CM | POA: Insufficient documentation

## 2019-02-25 DIAGNOSIS — B309 Viral conjunctivitis, unspecified: Secondary | ICD-10-CM | POA: Insufficient documentation

## 2019-02-25 DIAGNOSIS — F17210 Nicotine dependence, cigarettes, uncomplicated: Secondary | ICD-10-CM | POA: Insufficient documentation

## 2019-02-25 DIAGNOSIS — Z7984 Long term (current) use of oral hypoglycemic drugs: Secondary | ICD-10-CM | POA: Insufficient documentation

## 2019-02-25 DIAGNOSIS — E119 Type 2 diabetes mellitus without complications: Secondary | ICD-10-CM | POA: Insufficient documentation

## 2019-02-25 HISTORY — DX: Type 2 diabetes mellitus without complications: E11.9

## 2019-02-25 MED ORDER — KETOROLAC TROMETHAMINE 0.5 % OP SOLN: 1.0000 [drp] | Freq: Four times a day (QID) | OPHTHALMIC | 0 refills | Status: DC

## 2019-02-25 MED ORDER — FLUORESCEIN SODIUM 1 MG OP STRP
1.0000 | ORAL_STRIP | Freq: Once | OPHTHALMIC | Status: AC
  Administered 2019-02-25: 14:00:00 1 via OPHTHALMIC
  Filled 2019-02-25: qty 1

## 2019-02-25 MED ORDER — ERYTHROMYCIN 5 MG/GM OP OINT: 1.0000 "application " | TOPICAL_OINTMENT | Freq: Four times a day (QID) | OPHTHALMIC | 0 refills | Status: DC

## 2019-02-25 MED ORDER — ERYTHROMYCIN 5 MG/GM OP OINT
1.0000 "application " | TOPICAL_OINTMENT | Freq: Four times a day (QID) | OPHTHALMIC | Status: DC
  Administered 2019-02-25: 1 via OPHTHALMIC
  Filled 2019-02-25: qty 1

## 2019-02-25 MED ORDER — TETRACAINE HCL 0.5 % OP SOLN
2.0000 [drp] | Freq: Once | OPHTHALMIC | Status: AC
  Administered 2019-02-25: 14:00:00 2 [drp] via OPHTHALMIC
  Filled 2019-02-25: qty 4

## 2019-02-25 NOTE — Discharge Instructions (Signed)
You are being treated for a viral infection and conjunctivitis to the eye. Keep your hands clean before and after putting drops/ointment in the eye. Avoid touching the tip of the applicators to your lashes or lids. Follow-up with Dr. Edison Pace or return as needed.

## 2019-02-25 NOTE — ED Provider Notes (Signed)
[facility] Emergency Department Provider Note ____________________________________________  Time seen: 1302  I have reviewed the triage vital signs and the nursing notes.  HISTORY  Chief Complaint  Eye Pain  HPI Ronald Montgomery is a 38 y.o. male presents himself to the ED for evaluation of foreign body sensation to the right eye.  Patient describes on Friday, 3 days prior to arrival,  he felt like he had a "hair" in his right eye.  Since that time he has had light sensitivity and discharge from the eye.  He denies any other injury at this time.  Past Medical History:  Diagnosis Date  . Alcohol abuse   . Asthma   . Chlamydia   . Constipation   . Diabetes mellitus without complication (HCC)   . Difficult intubation   . Penile pain 02/03/2015   Normal exams x several   . Scabies   . Testicular/scrotal pain 02/03/2015   Normal exam and [place] x several.    . Thyroid disorder    during childhood    Patient Active Problem List   Diagnosis Date Noted  . Epiglottitis 07/07/2015  . Penile pain 02/03/2015  . Testicular/scrotal pain 02/03/2015    Past Surgical History:  Procedure Laterality Date  . ABCESS DRAINAGE     groin area  . CYST EXCISION     top of head  . DIRECT LARYNGOSCOPY  07/07/2015   Procedure: DIRECT LARYNGOSCOPY;  Surgeon: Linus Salmons, MD;  Location: [facility] ORS;  Service: ENT;;  . INCISION AND DRAINAGE ABSCESS N/A 07/07/2015   Procedure: INCISION AND DRAINAGE ABSCESS;  Surgeon: Linus Salmons, MD;  Location: [facility] ORS;  Service: ENT;  Laterality: N/A;  epiglottic abscess  . RHYTIDECTOMY NECK / CHEEK / CHIN     due to accident  . TONSILLECTOMY Bilateral 06/13/2017   Procedure: TONSILLECTOMY;  Surgeon: Linus Salmons, MD;  Location: [facility] ORS;  Service: ENT;  Laterality: Bilateral;    Prior to Admission medications   Medication Sig Start Date End Date Taking? Authorizing Provider  erythromycin ophthalmic ointment Place 1 application into the  right eye 4 (four) times daily. 02/25/19   Niveah Boerner, Charlesetta Ivory, PA-C  ketorolac (ACULAR) 0.5 % ophthalmic solution Place 1 drop into the right eye 4 (four) times daily. 02/25/19   Cahterine Heinzel, Charlesetta Ivory, PA-C  metFORMIN (GLUCOPHAGE) 500 MG tablet Take 1 tablet (500 mg total) by mouth 2 (two) times daily with a meal. 09/12/18   Nita Sickle, MD    Allergies Patient has no known allergies.  Family History  Problem Relation Age of Onset  . Skin cancer Mother   . Diabetes Mother   . Thyroid cancer Maternal Grandfather   . Heart attack Maternal Grandfather   . Diabetes Maternal Grandfather     Social History Social History   Tobacco Use  . Smoking status: Current Every Day Smoker    Packs/day: 0.50    Types: Cigarettes  . Smokeless tobacco: Never Used  Substance Use Topics  . Alcohol use: Yes    Alcohol/week: 0.0 standard drinks    Comment: occasional  . Drug use: Not Currently    Types: Marijuana    Review of Systems  Constitutional: Negative for fever. Eyes: Negative for visual changes.  Right eye foreign body sensation as above. ENT: Negative for sore throat. Cardiovascular: Negative for chest pain. Respiratory: Negative for shortness of breath. Gastrointestinal: Negative for abdominal pain, vomiting and diarrhea. Genitourinary: Negative for dysuria. Musculoskeletal: Negative for back pain.  Skin: Negative for rash. Neurological: Negative for headaches, focal weakness or numbness. ____________________________________________  PHYSICAL EXAM:  VITAL SIGNS: ED Triage Vitals  Enc Vitals Group     BP 02/25/19 1253 (!) 135/91     Pulse Rate 02/25/19 1253 91     Resp 02/25/19 1253 18     Temp 02/25/19 1253 98.5 F (36.9 C)     Temp Source 02/25/19 1253 Oral     SpO2 02/25/19 1253 96 %     Weight --      Height --      Head Circumference --      Peak Flow --      Pain Score 02/25/19 1255 6     Pain Loc --      Pain Edu? --      Excl. in [place]? --      Constitutional: Alert and oriented. Well appearing and in no distress. Head: Normocephalic and atraumatic. Eyes: Conjunctivae are injected and edematous on the right.  The palpebral conjunctiva is also injected.  Fluorescein dye uptake on exam.  Gross foreign body appreciated. PERRL. Normal extraocular movements Hematological/Lymphatic/Immunological: Palpable preauricular lymphadenopathy on the right. Cardiovascular: Normal rate, regular rhythm. Normal distal pulses. Respiratory: Normal respiratory effort. No wheezes/rales/rhonchi. Gastrointestinal: Soft and nontender. No distention. Musculoskeletal: Nontender with normal range of motion in all extremities.  Neurologic:  Normal gait without ataxia. Normal speech and language. No gross focal neurologic deficits are appreciated. Skin:  Skin is warm, dry and intact. No rash noted. ____________________________________________  PROCEDURES  Visual Acuity  Right Eye Distance: 20/30 uncorrected Left Eye Distance: 20/15 uncorrected Bilateral Distance: 20/15 uncorrected ____________________________________________  INITIAL IMPRESSION / ASSESSMENT AND PLAN / ED COURSE  Patient with ED evaluation of suspected foreign body sensation to the right eye.  He is found to have a probable viral conjunctivitis secondary to pathopneumonic previously lymphadenopathy.  No fluorescein dye uptake is appreciated on exam.  Patient is discharged with prescription for emergent ointment as well as Acular drops to take as directed.  He has some mild irritation to the upper lid and is encouraged to apply topical hydrocortisone cream versus antibiotic ointment to help with skin irritation.  He is referred to ophthalmology for any ongoing symptoms.  Work note provided for 1 day as is appropriate.  Ronald Montgomery was evaluated in Emergency Department on 02/25/2019 for the symptoms described in the history of present illness. He was evaluated in the context of the global  COVID-19 pandemic, which necessitated consideration that the patient might be at risk for infection with the SARS-CoV-2 virus that causes COVID-19. Institutional protocols and algorithms that pertain to the evaluation of patients at risk for COVID-19 are in a state of rapid change based on information released by regulatory bodies including the CDC and federal and state organizations. These policies and algorithms were followed during the patient's care in the ED. ____________________________________________  FINAL CLINICAL IMPRESSION(S) / ED DIAGNOSES  Final diagnoses:  Acute viral conjunctivitis of right eye      Carmie End, Dannielle Karvonen, PA-C 02/25/19 1333    Vanessa Union, MD 02/27/19 Einar Crow

## 2019-02-25 NOTE — ED Triage Notes (Signed)
Per patient's report, patient's right eye began itching, felt like a "hair in it" on Friday. Patient states he rubbed it and now has difficulty with light and has a discharge.

## 2019-06-24 ENCOUNTER — Other Ambulatory Visit: Payer: Self-pay

## 2019-06-24 ENCOUNTER — Ambulatory Visit: Payer: Medicaid Other | Attending: Internal Medicine

## 2019-06-24 ENCOUNTER — Encounter: Payer: Self-pay | Admitting: Emergency Medicine

## 2019-06-24 ENCOUNTER — Emergency Department: Payer: Medicaid Other

## 2019-06-24 ENCOUNTER — Emergency Department
Admission: EM | Admit: 2019-06-24 | Discharge: 2019-06-24 | Disposition: A | Discharge status: Home / Self Care | Payer: Medicaid Other | Attending: Emergency Medicine | Admitting: Emergency Medicine

## 2019-06-24 DIAGNOSIS — E119 Type 2 diabetes mellitus without complications: Secondary | ICD-10-CM | POA: Insufficient documentation

## 2019-06-24 DIAGNOSIS — J45909 Unspecified asthma, uncomplicated: Secondary | ICD-10-CM | POA: Insufficient documentation

## 2019-06-24 DIAGNOSIS — F17210 Nicotine dependence, cigarettes, uncomplicated: Secondary | ICD-10-CM | POA: Insufficient documentation

## 2019-06-24 DIAGNOSIS — B349 Viral infection, unspecified: Secondary | ICD-10-CM | POA: Insufficient documentation

## 2019-06-24 DIAGNOSIS — Z79899 Other long term (current) drug therapy: Secondary | ICD-10-CM | POA: Insufficient documentation

## 2019-06-24 DIAGNOSIS — Z20822 Contact with and (suspected) exposure to covid-19: Secondary | ICD-10-CM

## 2019-06-24 DIAGNOSIS — J029 Acute pharyngitis, unspecified: Secondary | ICD-10-CM

## 2019-06-24 LAB — GLUCOSE, CAPILLARY: Glucose-Capillary: 224 mg/dL — ABNORMAL HIGH (ref 70–99)

## 2019-06-24 LAB — GROUP A STREP BY PCR: Group A Strep by PCR: NOT DETECTED

## 2019-06-24 MED ORDER — LIDOCAINE VISCOUS HCL 2 % MT SOLN
15.0000 mL | Freq: Once | OROMUCOSAL | Status: AC
  Administered 2019-06-24: 15 mL via OROMUCOSAL
  Filled 2019-06-24: qty 15

## 2019-06-24 MED ORDER — IBUPROFEN 600 MG PO TABS
600.0000 mg | ORAL_TABLET | Freq: Once | ORAL | Status: AC
  Administered 2019-06-24: 600 mg via ORAL
  Filled 2019-06-24: qty 1

## 2019-06-24 MED ORDER — LIDOCAINE VISCOUS HCL 2 % MT SOLN: 5.0000 mL | Freq: Four times a day (QID) | OROMUCOSAL | 0 refills | Status: DC | PRN

## 2019-06-24 NOTE — ED Provider Notes (Signed)
[facility] Emergency Department Provider Note   ____________________________________________   First MD Initiated Contact with Patient 06/24/19 1134     (approximate)  I have reviewed the triage vital signs and the nursing notes.   HISTORY  Chief Complaint Sore Throat    HPI Ronald Montgomery is a 39 y.o. male patient complaining of 3 days of sore throat, dry mouth, difficulty swallowing solid foods.  Patient suspected a fever couple days ago.  Patient denies recent travel or known contact with COVID-19.  Given of COVID-19 test this morning results are pending.  Patient also complained of body aches and fatigue.         Past Medical History:  Diagnosis Date  . Alcohol abuse   . Asthma   . Chlamydia   . Constipation   . Diabetes mellitus without complication (HCC)   . Difficult intubation   . Penile pain 02/03/2015   Normal exams x several   . Scabies   . Testicular/scrotal pain 02/03/2015   Normal exam and [place] x several.    . Thyroid disorder    during childhood    Patient Active Problem List   Diagnosis Date Noted  . Epiglottitis 07/07/2015  . Penile pain 02/03/2015  . Testicular/scrotal pain 02/03/2015    Past Surgical History:  Procedure Laterality Date  . ABCESS DRAINAGE     groin area  . CYST EXCISION     top of head  . DIRECT LARYNGOSCOPY  07/07/2015   Procedure: DIRECT LARYNGOSCOPY;  Surgeon: Linus Salmons, MD;  Location: [facility] ORS;  Service: ENT;;  . INCISION AND DRAINAGE ABSCESS N/A 07/07/2015   Procedure: INCISION AND DRAINAGE ABSCESS;  Surgeon: Linus Salmons, MD;  Location: [facility] ORS;  Service: ENT;  Laterality: N/A;  epiglottic abscess  . RHYTIDECTOMY NECK / CHEEK / CHIN     due to accident  . TONSILLECTOMY Bilateral 06/13/2017   Procedure: TONSILLECTOMY;  Surgeon: Linus Salmons, MD;  Location: [facility] ORS;  Service: ENT;  Laterality: Bilateral;    Prior to Admission medications   Medication Sig Start Date End Date Taking?  Authorizing Provider  lidocaine (XYLOCAINE) 2 % solution Use as directed 5 mLs in the mouth or throat every 6 (six) hours as needed for mouth pain. 06/24/19   Joni Reining, PA-C  metFORMIN (GLUCOPHAGE) 500 MG tablet Take 1 tablet (500 mg total) by mouth 2 (two) times daily with a meal. 09/12/18   Nita Sickle, MD    Allergies Patient has no known allergies.  Family History  Problem Relation Age of Onset  . Skin cancer Mother   . Diabetes Mother   . Thyroid cancer Maternal Grandfather   . Heart attack Maternal Grandfather   . Diabetes Maternal Grandfather     Social History Social History   Tobacco Use  . Smoking status: Current Every Day Smoker    Packs/day: 0.50    Types: Cigarettes  . Smokeless tobacco: Never Used  Substance Use Topics  . Alcohol use: Yes    Alcohol/week: 0.0 standard drinks    Comment: occasional  . Drug use: Not Currently    Types: Marijuana    Review of Systems Constitutional: Objective fever.  Fatigue and body aches.   Eyes: No visual changes. ENT: Sore throat. Cardiovascular: Denies chest pain. Respiratory: Denies shortness of breath. Gastrointestinal: No abdominal pain.  No nausea, no vomiting.  No diarrhea.  No constipation. Genitourinary: Negative for dysuria. Musculoskeletal: Negative for back pain. Skin: Negative for rash.  Neurological: Negative for headaches, focal weakness or numbness. Psychiatric:  EtOH abuse Endocrine:  Diabetes and hypothyroidism.   ____________________________________________   PHYSICAL EXAM:  VITAL SIGNS: ED Triage Vitals  Enc Vitals Group     BP 06/24/19 1141 (!) 153/98     Pulse Rate 06/24/19 1141 89     Resp 06/24/19 1141 19     Temp 06/24/19 1141 98.5 F (36.9 C)     Temp Source 06/24/19 1141 Oral     SpO2 06/24/19 1141 97 %     Weight 06/24/19 1143 265 lb (120.2 kg)     Height 06/24/19 1143 5\' 6"  (1.676 m)     Head Circumference --      Peak Flow --      Pain Score 06/24/19 1142 1      Pain Loc --      Pain Edu? --      Excl. in GC? --    Constitutional: Alert and oriented. Well appearing and in no acute distress. Eyes: Conjunctivae are normal. PERRL. EOMI. Head: Atraumatic. Nose: No congestion/rhinnorhea. Mouth/Throat: Mucous membranes are moist.  Oropharynx non-erythematous. Neck: No stridor.  Hematological/Lymphatic/Immunilogical: No cervical lymphadenopathy. Cardiovascular: Normal rate, regular rhythm. Grossly normal heart sounds.  Good peripheral circulation.  Elevated blood pressure. Respiratory: Normal respiratory effort.  No retractions. Lungs CTAB. Neurologic:  Normal speech and language. No gross focal neurologic deficits are appreciated. No gait instability. Skin:  Skin is warm, dry and intact. No rash noted. Psychiatric: Mood and affect are normal. Speech and behavior are normal.  ____________________________________________   LABS (all labs ordered are listed, but only abnormal results are displayed)  Labs Reviewed  GLUCOSE, CAPILLARY - Abnormal; Notable for the following components:      Result Value   Glucose-Capillary 224 (*)    All other components within normal limits  GROUP A STREP BY PCR  CBG MONITORING, ED   ____________________________________________  EKG   ____________________________________________  RADIOLOGY  ED MD interpretation:    Official radiology report(s): DG Neck Soft Tissue  Result Date: 06/24/2019 CLINICAL DATA:  Increasing dysphagia EXAM: NECK SOFT TISSUES - 1+ VIEW COMPARISON:  None. FINDINGS: There is no evidence of retropharyngeal soft tissue swelling or epiglottic enlargement. The cervical airway is unremarkable and no radio-opaque foreign body identified. IMPRESSION: Negative. Electronically Signed   By: 06/26/2019 M.D.   On: 06/24/2019 12:13    ____________________________________________   PROCEDURES  Procedure(s) performed (including Critical  Care):  Procedures   ____________________________________________   INITIAL IMPRESSION / ASSESSMENT AND PLAN / ED COURSE  As part of my medical decision making, I reviewed the following data within the electronic MEDICAL RECORD NUMBER    Patient presents with sore throat and dry mouth.  Patient also complaining of fatigue and body aches.  Patient has a COVID-19 test results pending.  Discussed negative x-ray and strep results with patient.  Patient physical exam consistent with viral illness and viral pharyngitis.  Patient given discharge care instruction advised take medication as directed.  Patient advised self quarantine pending results of COVID-19 test.  Patient advised test is positive must quarantine for additional 10 days.  Ad[facility].    YAHYA BOLDMAN was evaluated in Emergency Department on 06/24/2019 for the symptoms described in the history of present illness. He was evaluated in the context of the global COVID-19 pandemic, which necessitated consideration that the patient might be at risk for infection with the SARS-CoV-2 virus that causes COVID-19. Institutional protocols  and algorithms that pertain to the evaluation of patients at risk for COVID-19 are in a state of rapid change based on information released by regulatory bodies including the CDC and federal and state organizations. These policies and algorithms were followed during the patient's care in the ED.       ____________________________________________   FINAL CLINICAL IMPRESSION(S) / ED DIAGNOSES  Final diagnoses:  Viral illness  Sore throat     ED Discharge Orders         Ordered    lidocaine (XYLOCAINE) 2 % solution  Every 6 hours PRN     06/24/19 1310           Note:  This document was prepared using Dragon voice recognition software and may include unintentional dictation errors.    Sable Feil, PA-C 06/24/19 1314    Blake Divine, MD 06/25/19 [phone]

## 2019-06-24 NOTE — Discharge Instructions (Signed)
Follow discharge care instructions.  Advised self quarantine pending results of COVID-19 test.

## 2019-06-24 NOTE — ED Triage Notes (Signed)
Presents with sore throat and dry mouth   Subjective fever couple of days ago

## 2019-06-25 LAB — NOVEL CORONAVIRUS, NAA: SARS-CoV-2, NAA: NOT DETECTED

## 2019-07-17 ENCOUNTER — Emergency Department
Admission: EM | Admit: 2019-07-17 | Discharge: 2019-07-17 | Disposition: A | Discharge status: Home / Self Care | Payer: Medicaid Other | Attending: Emergency Medicine | Admitting: Emergency Medicine

## 2019-07-17 ENCOUNTER — Other Ambulatory Visit: Payer: Self-pay

## 2019-07-17 ENCOUNTER — Encounter: Payer: Self-pay | Admitting: Emergency Medicine

## 2019-07-17 DIAGNOSIS — N341 Nonspecific urethritis: Secondary | ICD-10-CM | POA: Insufficient documentation

## 2019-07-17 DIAGNOSIS — N342 Other urethritis: Secondary | ICD-10-CM

## 2019-07-17 DIAGNOSIS — E119 Type 2 diabetes mellitus without complications: Secondary | ICD-10-CM | POA: Insufficient documentation

## 2019-07-17 DIAGNOSIS — Z7984 Long term (current) use of oral hypoglycemic drugs: Secondary | ICD-10-CM | POA: Insufficient documentation

## 2019-07-17 DIAGNOSIS — J45909 Unspecified asthma, uncomplicated: Secondary | ICD-10-CM | POA: Insufficient documentation

## 2019-07-17 DIAGNOSIS — F17210 Nicotine dependence, cigarettes, uncomplicated: Secondary | ICD-10-CM | POA: Insufficient documentation

## 2019-07-17 LAB — URINALYSIS, COMPLETE (UACMP) WITH MICROSCOPIC
Bacteria, UA: NONE SEEN
Bilirubin Urine: NEGATIVE
Glucose, UA: NEGATIVE mg/dL
Hgb urine dipstick: NEGATIVE
Ketones, ur: NEGATIVE mg/dL
Leukocytes,Ua: NEGATIVE
Nitrite: NEGATIVE
Protein, ur: NEGATIVE mg/dL
Specific Gravity, Urine: 1.026 (ref 1.005–1.030)
pH: 5 (ref 5.0–8.0)

## 2019-07-17 LAB — CHLAMYDIA/NGC RT PCR (ARMC ONLY)
Chlamydia Tr: NOT DETECTED
N gonorrhoeae: NOT DETECTED

## 2019-07-17 MED ORDER — CEFTRIAXONE SODIUM 1 G IJ SOLR
500.0000 mg | Freq: Once | INTRAMUSCULAR | Status: AC
  Administered 2019-07-17: 500 mg via INTRAMUSCULAR
  Filled 2019-07-17 (×2): qty 10

## 2019-07-17 MED ORDER — AZITHROMYCIN 500 MG PO TABS
1000.0000 mg | ORAL_TABLET | Freq: Once | ORAL | Status: AC
  Administered 2019-07-17: 10:00:00 1000 mg via ORAL
  Filled 2019-07-17: qty 2

## 2019-07-17 MED ORDER — LIDOCAINE HCL (PF) 1 % IJ SOLN
INTRAMUSCULAR | Status: AC
  Filled 2019-07-17: qty 5

## 2019-07-17 NOTE — ED Triage Notes (Signed)
Pt reports for the last week has had some burning when he urinated. Pt reports may have a slight nit of discharge but not a lot. Pt reports that he had a new sexual partner a cuple weeks ago. Pt states that it always feels like he has to urinate even if he does not.

## 2019-07-17 NOTE — ED Notes (Signed)
Pt c/o painful urination for a few days, with redness to the tip of his penis. States he did have a new partner about 3 weeks ago. Pt also c/o rash around BL eyes with itching.

## 2019-07-17 NOTE — ED Provider Notes (Signed)
[facility] Emergency Department Provider Note   ____________________________________________   First MD Initiated Contact with Patient 07/17/19 [phone]     (approximate)  I have reviewed the triage vital signs and the nursing notes.   HISTORY  Chief Complaint Penile Discharge and Dysuria    HPI Ronald Montgomery is a 39 y.o. male presents to the ED with complaint of painful urination for a couple days and redness to the tip of his penis.  Patient states that he he has not seen any penile discharge.  He states that he did have a new sexual partner approximately 3 weeks ago that was unprotected.  Patient states he has not spoken to this person since her sexual encounter 3 weeks ago.  He does not know if she is having any symptoms.  Patient has had a history of STDs but he is also been seen by [facility] urological for urinary symptoms and urethritis.  He denies any fever, chills, nausea or vomiting.  There is been no hematuria.  He rates his pain as a 4 out of 10.     Past Medical History:  Diagnosis Date  . Alcohol abuse   . Asthma   . Chlamydia   . Constipation   . Diabetes mellitus without complication ([place])   . Difficult intubation   . Penile pain 02/03/2015   Normal exams x several   . Scabies   . Testicular/scrotal pain 02/03/2015   Normal exam and [place] x several.    . Thyroid disorder    during childhood    Patient Active Problem List   Diagnosis Date Noted  . Epiglottitis 07/07/2015  . Penile pain 02/03/2015  . Testicular/scrotal pain 02/03/2015    Past Surgical History:  Procedure Laterality Date  . ABCESS DRAINAGE     groin area  . CYST EXCISION     top of head  . DIRECT LARYNGOSCOPY  07/07/2015   Procedure: DIRECT LARYNGOSCOPY;  Surgeon: Beverly Gust, MD;  Location: [facility] ORS;  Service: ENT;;  . INCISION AND DRAINAGE ABSCESS N/A 07/07/2015   Procedure: INCISION AND DRAINAGE ABSCESS;  Surgeon: Beverly Gust, MD;  Location: [facility] ORS;   Service: ENT;  Laterality: N/A;  epiglottic abscess  . RHYTIDECTOMY NECK / CHEEK / CHIN     due to accident  . TONSILLECTOMY Bilateral 06/13/2017   Procedure: TONSILLECTOMY;  Surgeon: Beverly Gust, MD;  Location: [facility] ORS;  Service: ENT;  Laterality: Bilateral;    Prior to Admission medications   Medication Sig Start Date End Date Taking? Authorizing Provider  lidocaine (XYLOCAINE) 2 % solution Use as directed 5 mLs in the mouth or throat every 6 (six) hours as needed for mouth pain. 06/24/19   Sable Feil, PA-C  metFORMIN (GLUCOPHAGE) 500 MG tablet Take 1 tablet (500 mg total) by mouth 2 (two) times daily with a meal. 09/12/18   Rudene Re, MD    Allergies Patient has no known allergies.  Family History  Problem Relation Age of Onset  . Skin cancer Mother   . Diabetes Mother   . Thyroid cancer Maternal Grandfather   . Heart attack Maternal Grandfather   . Diabetes Maternal Grandfather     Social History Social History   Tobacco Use  . Smoking status: Current Every Day Smoker    Packs/day: 0.50    Types: Cigarettes  . Smokeless tobacco: Never Used  Substance Use Topics  . Alcohol use: Yes    Alcohol/week: 0.0 standard drinks  Comment: occasional  . Drug use: Not Currently    Types: Marijuana    Review of Systems Constitutional: No fever/chills Eyes: No visual changes. ENT: No sore throat. Cardiovascular: Denies chest pain. Respiratory: Denies shortness of breath. Gastrointestinal: No abdominal pain.  No nausea, no vomiting. Genitourinary: Positive for dysuria.  Positive for unprotected sex. Musculoskeletal: Negative for back pain. Skin: Negative for rash. Neurological: Negative for headaches, focal weakness or numbness.  ____________________________________________   PHYSICAL EXAM:  VITAL SIGNS: ED Triage Vitals  Enc Vitals Group     BP 07/17/19 0742 (!) 149/91     Pulse Rate 07/17/19 0742 83     Resp 07/17/19 0742 20     Temp 07/17/19  0742 98.9 F (37.2 C)     Temp Source 07/17/19 0742 Oral     SpO2 07/17/19 0742 100 %     Weight 07/17/19 0736 265 lb (120.2 kg)     Height 07/17/19 0736 5\' 6"  (1.676 m)     Head Circumference --      Peak Flow --      Pain Score 07/17/19 0735 4     Pain Loc --      Pain Edu? --      Excl. in GC? --     Constitutional: Alert and oriented. Well appearing and in no acute distress. Eyes: Conjunctivae are normal.  Head: Atraumatic. Neck: No stridor.   Cardiovascular: Normal rate, regular rhythm. Grossly normal heart sounds.  Good peripheral circulation. Respiratory: Normal respiratory effort.  No retractions. Lungs CTAB. Musculoskeletal: Moves upper and lower extremities without any difficulty.  Normal gait was noted. Neurologic:  Normal speech and language. No gross focal neurologic deficits are appreciated. No gait instability. Skin:  Skin is warm, dry and intact. No rash noted. Psychiatric: Mood and affect are normal. Speech and behavior are normal.  ____________________________________________   LABS (all labs ordered are listed, but only abnormal results are displayed)  Labs Reviewed  URINALYSIS, COMPLETE (UACMP) WITH MICROSCOPIC - Abnormal; Notable for the following components:      Result Value   Color, Urine YELLOW (*)    APPearance CLEAR (*)    All other components within normal limits  CHLAMYDIA/NGC RT PCR ([facility] ONLY)     PROCEDURES  Procedure(s) performed (including Critical Care):  Procedures   ____________________________________________   INITIAL IMPRESSION / ASSESSMENT AND PLAN / ED COURSE  As part of my medical decision making, I reviewed the following data within the electronic MEDICAL RECORD NUMBER Notes from prior ED visits and [place] Controlled Substance Database  39 year old male presents to the ED with complaint of dysuria without history of penile discharge.  He states there has been some redness near the tip of his penis.  He also gives  history of a new sexual partner approximately 3 weeks ago and had unprotected sex.  Patient has not spoken with this person since their sexual encounter and it is worrisome for an STD.  Patient was given Rocephin 500 mg IM and 1 g of Zithromax p.o.  Patient is to follow-up with [facility] urological as he is an established patient there for further work-up if any continued problems.  His GC and Chlamydia tests were reported as negative just after patient was discharged.  ____________________________________________   FINAL CLINICAL IMPRESSION(S) / ED DIAGNOSES  Final diagnoses:  Urethritis     ED Discharge Orders    None       Note:  This document was prepared using Dragon voice  recognition software and may include unintentional dictation errors.    Tommi Rumps, PA-C 07/17/19 1513    Minna Antis, MD 07/17/19 1515

## 2019-07-17 NOTE — Discharge Instructions (Signed)
Follow-up with [facility] urological or [facility] department if any continued problems.  You were treated for chlamydia and gonorrhea while in the emergency department.  Your cultures will not result until most likely 6 hours from now.  You can see the test results on my chart.  If you continue to have problems you will need to have further work-up most likely with [facility] as you have done in the past.  Also read the information about safe sex.

## 2019-08-05 ENCOUNTER — Other Ambulatory Visit: Payer: Self-pay

## 2019-08-05 ENCOUNTER — Emergency Department
Admission: EM | Admit: 2019-08-05 | Discharge: 2019-08-05 | Disposition: A | Discharge status: Home / Self Care | Payer: Medicaid Other | Attending: Emergency Medicine | Admitting: Emergency Medicine

## 2019-08-05 ENCOUNTER — Encounter: Payer: Self-pay | Admitting: Emergency Medicine

## 2019-08-05 DIAGNOSIS — Z7984 Long term (current) use of oral hypoglycemic drugs: Secondary | ICD-10-CM | POA: Insufficient documentation

## 2019-08-05 DIAGNOSIS — F17210 Nicotine dependence, cigarettes, uncomplicated: Secondary | ICD-10-CM | POA: Insufficient documentation

## 2019-08-05 DIAGNOSIS — R300 Dysuria: Secondary | ICD-10-CM | POA: Insufficient documentation

## 2019-08-05 DIAGNOSIS — E119 Type 2 diabetes mellitus without complications: Secondary | ICD-10-CM | POA: Insufficient documentation

## 2019-08-05 DIAGNOSIS — J45909 Unspecified asthma, uncomplicated: Secondary | ICD-10-CM | POA: Insufficient documentation

## 2019-08-05 LAB — URINALYSIS, COMPLETE (UACMP) WITH MICROSCOPIC
Bacteria, UA: NONE SEEN
Bilirubin Urine: NEGATIVE
Glucose, UA: NEGATIVE mg/dL
Hgb urine dipstick: NEGATIVE
Ketones, ur: NEGATIVE mg/dL
Leukocytes,Ua: NEGATIVE
Nitrite: NEGATIVE
Protein, ur: NEGATIVE mg/dL
Specific Gravity, Urine: 1.02 (ref 1.005–1.030)
pH: 5 (ref 5.0–8.0)

## 2019-08-05 LAB — CHLAMYDIA/NGC RT PCR (ARMC ONLY)
Chlamydia Tr: NOT DETECTED
N gonorrhoeae: NOT DETECTED

## 2019-08-05 NOTE — Discharge Instructions (Addendum)
Your labs results were negative for chlamydia and gonorrhea.  Urine shows no bacteria consistent with a urinary tract infection.  If complaints persist advised to follow-up with the [facility] department.

## 2019-08-05 NOTE — ED Provider Notes (Signed)
[facility] Emergency Department Provider Note   ____________________________________________   First MD Initiated Contact with Patient 08/05/19 [phone]     (approximate)  I have reviewed the triage vital signs and the nursing notes.   HISTORY  Chief Complaint Dysuria    HPI Ronald Montgomery is a 39 y.o. male patient complain of dysuria, malodorous urine, and urethral discharge for 4 days.  Patient stated last unprotected sexual contact was 6 days ago.  Patient rates his pain/discomfort as a 4/10.  No palliative measure for complaint.  History of STD.      Past Medical History:  Diagnosis Date  . Alcohol abuse   . Asthma   . Chlamydia   . Constipation   . Diabetes mellitus without complication (HCC)   . Difficult intubation   . Penile pain 02/03/2015   Normal exams x several   . Scabies   . Testicular/scrotal pain 02/03/2015   Normal exam and [place] x several.    . Thyroid disorder    during childhood    Patient Active Problem List   Diagnosis Date Noted  . Epiglottitis 07/07/2015  . Penile pain 02/03/2015  . Testicular/scrotal pain 02/03/2015    Past Surgical History:  Procedure Laterality Date  . ABCESS DRAINAGE     groin area  . CYST EXCISION     top of head  . DIRECT LARYNGOSCOPY  07/07/2015   Procedure: DIRECT LARYNGOSCOPY;  Surgeon: Linus Salmons, MD;  Location: [facility] ORS;  Service: ENT;;  . INCISION AND DRAINAGE ABSCESS N/A 07/07/2015   Procedure: INCISION AND DRAINAGE ABSCESS;  Surgeon: Linus Salmons, MD;  Location: [facility] ORS;  Service: ENT;  Laterality: N/A;  epiglottic abscess  . RHYTIDECTOMY NECK / CHEEK / CHIN     due to accident  . TONSILLECTOMY Bilateral 06/13/2017   Procedure: TONSILLECTOMY;  Surgeon: Linus Salmons, MD;  Location: [facility] ORS;  Service: ENT;  Laterality: Bilateral;    Prior to Admission medications   Medication Sig Start Date End Date Taking? Authorizing Provider  metFORMIN (GLUCOPHAGE) 500 MG tablet Take 1  tablet (500 mg total) by mouth 2 (two) times daily with a meal. 09/12/18   Nita Sickle, MD    Allergies Patient has no known allergies.  Family History  Problem Relation Age of Onset  . Skin cancer Mother   . Diabetes Mother   . Thyroid cancer Maternal Grandfather   . Heart attack Maternal Grandfather   . Diabetes Maternal Grandfather     Social History Social History   Tobacco Use  . Smoking status: Current Every Day Smoker    Packs/day: 0.50    Types: Cigarettes  . Smokeless tobacco: Never Used  Substance Use Topics  . Alcohol use: Yes    Alcohol/week: 0.0 standard drinks  . Drug use: Not Currently    Types: Marijuana    Review of Systems  Constitutional: No fever/chills Eyes: No visual changes. ENT: No sore throat. Cardiovascular: Denies chest pain. Respiratory: Denies shortness of breath. Gastrointestinal: No abdominal pain.  No nausea, no vomiting.  No diarrhea.  No constipation. Genitourinary: Positive for dysuria and urethral discharge. Musculoskeletal: Negative for back pain. Skin: Negative for rash. Neurological: Negative for headaches, focal weakness or numbness. Endocrine:  Diabetes.   ____________________________________________   PHYSICAL EXAM:  VITAL SIGNS: ED Triage Vitals  Enc Vitals Group     BP 08/05/19 0921 138/82     Pulse Rate 08/05/19 0921 63     Resp 08/05/19 0921 16  Temp 08/05/19 0921 98 F (36.7 C)     Temp Source 08/05/19 0921 Oral     SpO2 08/05/19 0921 97 %     Weight 08/05/19 0908 270 lb (122.5 kg)     Height 08/05/19 0908 5\' 6"  (1.676 m)     Head Circumference --      Peak Flow --      Pain Score 08/05/19 0908 4     Pain Loc --      Pain Edu? --      Excl. in [place]? --     Constitutional: Alert and oriented. Well appearing and in no acute distress. Cardiovascular: Normal rate, regular rhythm. Grossly normal heart sounds.  Good peripheral circulation. Respiratory: Normal respiratory effort.  No retractions.  Lungs CTAB. Genitourinary: No obvious lesions or urethral discharge. Neurologic:  Normal speech and language. No gross focal neurologic deficits are appreciated. No gait instability. Skin:  Skin is warm, dry and intact. No rash noted. Psychiatric: Mood and affect are normal. Speech and behavior are normal.  ____________________________________________   LABS (all labs ordered are listed, but only abnormal results are displayed)  Labs Reviewed  URINALYSIS, COMPLETE (UACMP) WITH MICROSCOPIC - Abnormal; Notable for the following components:      Result Value   Color, Urine YELLOW (*)    APPearance CLEAR (*)    All other components within normal limits  CHLAMYDIA/NGC RT PCR ([facility] ONLY)   ____________________________________________  EKG   ____________________________________________  RADIOLOGY  ED MD interpretation:    Official radiology report(s): No results found.  ____________________________________________   PROCEDURES  Procedure(s) performed (including Critical Care):  Procedures   ____________________________________________   INITIAL IMPRESSION / ASSESSMENT AND PLAN / ED COURSE  As part of my medical decision making, I reviewed the following data within the [place]    Patient presents with dysuria purulent discharge approximate 4 days.  Patient unable to express urethral discharge on exam.  Discussed negative UA results with patient.  Patient lab test also negative for gonorrhea and chlamydia.  Patient advised to follow-up with [facility].    [name] was evaluated in Emergency Department on 08/05/2019 for the symptoms described in the history of present illness. He was evaluated in the context of the global COVID-19 pandemic, which necessitated consideration that the patient might be at risk for infection with the SARS-CoV-2 virus that causes COVID-19. Institutional protocols and algorithms that pertain  to the evaluation of patients at risk for COVID-19 are in a state of rapid change based on information released by regulatory bodies including the CDC and federal and state organizations. These policies and algorithms were followed during the patient's care in the ED.       ____________________________________________   FINAL CLINICAL IMPRESSION(S) / ED DIAGNOSES  Final diagnoses:  Dysuria     ED Discharge Orders    None       Note:  This document was prepared using Dragon voice recognition software and may include unintentional dictation errors.    Sable Feil, PA-C 08/05/19 1112    Lavonia Drafts, MD 08/05/19 [phone]

## 2019-08-05 NOTE — ED Triage Notes (Signed)
Pt in via POV, complaints of dysuria and penile discharge x approximately 4 days.  Ambulatory to triage, NAD noted at this time.

## 2019-08-05 NOTE — ED Notes (Signed)
See triage note  Presents with some penile discharge and dysuria  States sxs' started about 4 days ago

## 2019-08-07 ENCOUNTER — Emergency Department: Payer: No Typology Code available for payment source

## 2019-08-07 ENCOUNTER — Emergency Department
Admission: EM | Admit: 2019-08-07 | Discharge: 2019-08-07 | Disposition: A | Discharge status: Home / Self Care | Payer: No Typology Code available for payment source | Attending: Emergency Medicine | Admitting: Emergency Medicine

## 2019-08-07 ENCOUNTER — Other Ambulatory Visit: Payer: Self-pay

## 2019-08-07 ENCOUNTER — Encounter: Payer: Self-pay | Admitting: Medical Oncology

## 2019-08-07 DIAGNOSIS — M546 Pain in thoracic spine: Secondary | ICD-10-CM | POA: Diagnosis not present

## 2019-08-07 DIAGNOSIS — M542 Cervicalgia: Secondary | ICD-10-CM | POA: Diagnosis present

## 2019-08-07 DIAGNOSIS — F17210 Nicotine dependence, cigarettes, uncomplicated: Secondary | ICD-10-CM | POA: Insufficient documentation

## 2019-08-07 DIAGNOSIS — J45909 Unspecified asthma, uncomplicated: Secondary | ICD-10-CM | POA: Diagnosis not present

## 2019-08-07 DIAGNOSIS — E119 Type 2 diabetes mellitus without complications: Secondary | ICD-10-CM | POA: Insufficient documentation

## 2019-08-07 DIAGNOSIS — M549 Dorsalgia, unspecified: Secondary | ICD-10-CM

## 2019-08-07 DIAGNOSIS — Z7984 Long term (current) use of oral hypoglycemic drugs: Secondary | ICD-10-CM | POA: Insufficient documentation

## 2019-08-07 DIAGNOSIS — G44309 Post-traumatic headache, unspecified, not intractable: Secondary | ICD-10-CM | POA: Diagnosis not present

## 2019-08-07 MED ORDER — ACETAMINOPHEN 325 MG PO TABS
650.0000 mg | ORAL_TABLET | Freq: Once | ORAL | Status: AC
  Administered 2019-08-07: 650 mg via ORAL
  Filled 2019-08-07: qty 2

## 2019-08-07 MED ORDER — IBUPROFEN 600 MG PO TABS: 600.0000 mg | ORAL_TABLET | Freq: Four times a day (QID) | ORAL | 0 refills | Status: DC | PRN

## 2019-08-07 MED ORDER — BACLOFEN 5 MG PO TABS: 5.0000 mg | ORAL_TABLET | Freq: Three times a day (TID) | ORAL | 0 refills | Status: DC | PRN
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
## 2019-08-19 ENCOUNTER — Other Ambulatory Visit: Payer: Self-pay | Admitting: Chiropractor

## 2019-08-19 ENCOUNTER — Other Ambulatory Visit
Admission: RE | Admit: 2019-08-19 | Discharge: 2019-08-19 | Disposition: A | Discharge status: Home / Self Care | Payer: No Typology Code available for payment source | Source: Ambulatory Visit | Attending: Chiropractor | Admitting: Chiropractor

## 2019-08-19 ENCOUNTER — Ambulatory Visit
Admission: RE | Admit: 2019-08-19 | Discharge: 2019-08-19 | Disposition: A | Discharge status: Home / Self Care | Payer: No Typology Code available for payment source | Source: Ambulatory Visit | Attending: Chiropractor | Admitting: Chiropractor

## 2019-08-19 DIAGNOSIS — M5432 Sciatica, left side: Secondary | ICD-10-CM

## 2019-08-28 ENCOUNTER — Encounter: Payer: Self-pay | Admitting: Emergency Medicine

## 2019-08-28 ENCOUNTER — Other Ambulatory Visit: Payer: Self-pay

## 2019-08-28 ENCOUNTER — Emergency Department
Admission: EM | Admit: 2019-08-28 | Discharge: 2019-08-28 | Disposition: A | Discharge status: Home / Self Care | Payer: No Typology Code available for payment source | Attending: Emergency Medicine | Admitting: Emergency Medicine

## 2019-08-28 DIAGNOSIS — M545 Low back pain, unspecified: Secondary | ICD-10-CM

## 2019-08-28 DIAGNOSIS — F17210 Nicotine dependence, cigarettes, uncomplicated: Secondary | ICD-10-CM | POA: Diagnosis not present

## 2019-08-28 DIAGNOSIS — E119 Type 2 diabetes mellitus without complications: Secondary | ICD-10-CM | POA: Insufficient documentation

## 2019-08-28 DIAGNOSIS — J45909 Unspecified asthma, uncomplicated: Secondary | ICD-10-CM | POA: Insufficient documentation

## 2019-08-28 DIAGNOSIS — Z7984 Long term (current) use of oral hypoglycemic drugs: Secondary | ICD-10-CM | POA: Insufficient documentation

## 2019-08-28 MED ORDER — KETOROLAC TROMETHAMINE 30 MG/ML IJ SOLN
30.0000 mg | Freq: Once | INTRAMUSCULAR | Status: AC
  Administered 2019-08-28: 30 mg via INTRAMUSCULAR
  Filled 2019-08-28: qty 1

## 2019-08-28 MED ORDER — ETODOLAC 400 MG PO TABS: 400.0000 mg | ORAL_TABLET | Freq: Two times a day (BID) | ORAL | 0 refills | Status: DC
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
## 2019-08-28 NOTE — Discharge Instructions (Signed)
Follow-up with your primary care provider and keep your appointment on your appointment date.  You may use ice or heat to your back as needed for discomfort.  Begin taking the etodolac 400 mg twice daily with food.  This medicine is every day until you see your primary care provider.  Also today your blood pressure was elevated and your primary care provider should recheck your blood pressure and evaluate this.  Today it was 132/96.
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
## 2019-09-02 ENCOUNTER — Ambulatory Visit: Payer: Medicaid Other | Attending: Internal Medicine

## 2019-09-02 DIAGNOSIS — Z20822 Contact with and (suspected) exposure to covid-19: Secondary | ICD-10-CM

## 2019-09-03 LAB — SARS-COV-2, NAA 2 DAY TAT

## 2019-09-03 LAB — NOVEL CORONAVIRUS, NAA: SARS-CoV-2, NAA: NOT DETECTED

## 2019-09-04 ENCOUNTER — Other Ambulatory Visit: Payer: Self-pay

## 2019-09-04 ENCOUNTER — Emergency Department
Admission: EM | Admit: 2019-09-04 | Discharge: 2019-09-04 | Disposition: A | Discharge status: Home / Self Care | Payer: Medicaid Other | Attending: Emergency Medicine | Admitting: Emergency Medicine

## 2019-09-04 DIAGNOSIS — Y92008 Other place in unspecified non-institutional (private) residence as the place of occurrence of the external cause: Secondary | ICD-10-CM | POA: Diagnosis not present

## 2019-09-04 DIAGNOSIS — E119 Type 2 diabetes mellitus without complications: Secondary | ICD-10-CM | POA: Insufficient documentation

## 2019-09-04 DIAGNOSIS — J45909 Unspecified asthma, uncomplicated: Secondary | ICD-10-CM | POA: Insufficient documentation

## 2019-09-04 DIAGNOSIS — Y999 Unspecified external cause status: Secondary | ICD-10-CM | POA: Insufficient documentation

## 2019-09-04 DIAGNOSIS — T148XXA Other injury of unspecified body region, initial encounter: Secondary | ICD-10-CM

## 2019-09-04 DIAGNOSIS — Z23 Encounter for immunization: Secondary | ICD-10-CM | POA: Insufficient documentation

## 2019-09-04 DIAGNOSIS — Y9301 Activity, walking, marching and hiking: Secondary | ICD-10-CM | POA: Diagnosis not present

## 2019-09-04 DIAGNOSIS — S90451A Superficial foreign body, right great toe, initial encounter: Secondary | ICD-10-CM | POA: Insufficient documentation

## 2019-09-04 DIAGNOSIS — S99921A Unspecified injury of right foot, initial encounter: Secondary | ICD-10-CM | POA: Diagnosis present

## 2019-09-04 DIAGNOSIS — Z7984 Long term (current) use of oral hypoglycemic drugs: Secondary | ICD-10-CM | POA: Insufficient documentation

## 2019-09-04 DIAGNOSIS — Z1889 Other specified retained foreign body fragments: Secondary | ICD-10-CM | POA: Diagnosis not present

## 2019-09-04 DIAGNOSIS — F17210 Nicotine dependence, cigarettes, uncomplicated: Secondary | ICD-10-CM | POA: Insufficient documentation

## 2019-09-04 DIAGNOSIS — X58XXXA Exposure to other specified factors, initial encounter: Secondary | ICD-10-CM | POA: Insufficient documentation

## 2019-09-04 DIAGNOSIS — Z79899 Other long term (current) drug therapy: Secondary | ICD-10-CM | POA: Insufficient documentation

## 2019-09-04 MED ORDER — LIDOCAINE HCL (PF) 1 % IJ SOLN
5.0000 mL | Freq: Once | INTRAMUSCULAR | Status: AC
  Administered 2019-09-04: 5 mL via INTRADERMAL
  Filled 2019-09-04: qty 5

## 2019-09-04 MED ORDER — BACITRACIN-NEOMYCIN-POLYMYXIN 400-5-5000 EX OINT
TOPICAL_OINTMENT | CUTANEOUS | Status: AC
  Filled 2019-09-04: qty 1

## 2019-09-04 MED ORDER — CEPHALEXIN 500 MG PO CAPS: 500.0000 mg | ORAL_CAPSULE | Freq: Three times a day (TID) | ORAL | 0 refills | Status: AC

## 2019-09-04 MED ORDER — BACITRACIN-NEOMYCIN-POLYMYXIN 400-5-5000 EX OINT: 1.0000 "application " | TOPICAL_OINTMENT | Freq: Two times a day (BID) | CUTANEOUS | 0 refills | Status: DC

## 2019-09-04 MED ORDER — TETANUS-DIPHTH-ACELL PERTUSSIS 5-2.5-18.5 LF-MCG/0.5 IM SUSP
0.5000 mL | Freq: Once | INTRAMUSCULAR | Status: AC
  Administered 2019-09-04: 0.5 mL via INTRAMUSCULAR
  Filled 2019-09-04: qty 0.5

## 2019-09-04 MED ORDER — BACITRACIN-NEOMYCIN-POLYMYXIN OINTMENT TUBE
TOPICAL_OINTMENT | Freq: Once | CUTANEOUS | Status: AC
  Administered 2019-09-04: 1 via TOPICAL
  Filled 2019-09-04: qty 14.17
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
## 2019-09-04 NOTE — ED Provider Notes (Signed)
[facility] Emergency Department Provider Note  ____________________________________________  Time seen: Approximately 12:47 PM  I have reviewed the triage vital signs and the nursing notes.   HISTORY  Chief Complaint Foreign Body in Skin    HPI Ronald Montgomery is a 39 y.o. male that presents to the emergency department for evaluation of splinter foreign body to great toe.  Splinter happened when he was walking barefoot on his porch.  He tried to pull out some of the splinter but did not get all of it.  He is unsure of last tetanus.   Past Medical History:  Diagnosis Date  . Alcohol abuse   . Asthma   . Chlamydia   . Constipation   . Diabetes mellitus without complication (HCC)   . Difficult intubation   . Penile pain 02/03/2015   Normal exams x several   . Scabies   . Testicular/scrotal pain 02/03/2015   Normal exam and [place] x several.    . Thyroid disorder    during childhood    Patient Active Problem List   Diagnosis Date Noted  . Epiglottitis 07/07/2015  . Penile pain 02/03/2015  . Testicular/scrotal pain 02/03/2015    Past Surgical History:  Procedure Laterality Date  . ABCESS DRAINAGE     groin area  . CYST EXCISION     top of head  . DIRECT LARYNGOSCOPY  07/07/2015   Procedure: DIRECT LARYNGOSCOPY;  Surgeon: Linus Salmons, MD;  Location: [facility] ORS;  Service: ENT;;  . INCISION AND DRAINAGE ABSCESS N/A 07/07/2015   Procedure: INCISION AND DRAINAGE ABSCESS;  Surgeon: Linus Salmons, MD;  Location: [facility] ORS;  Service: ENT;  Laterality: N/A;  epiglottic abscess  . RHYTIDECTOMY NECK / CHEEK / CHIN     due to accident  . TONSILLECTOMY Bilateral 06/13/2017   Procedure: TONSILLECTOMY;  Surgeon: Linus Salmons, MD;  Location: [facility] ORS;  Service: ENT;  Laterality: Bilateral;    Prior to Admission medications   Medication Sig Start Date End Date Taking? Authorizing Provider  cephALEXin (KEFLEX) 500 MG capsule Take 1 capsule (500 mg total) by  mouth 3 (three) times daily for 7 days. 09/04/19 09/11/19  Enid Derry, PA-C  etodolac (LODINE) 400 MG tablet Take 1 tablet (400 mg total) by mouth 2 (two) times daily. 08/28/19 08/27/20  Tommi Rumps, PA-C  metFORMIN (GLUCOPHAGE) 500 MG tablet Take 1 tablet (500 mg total) by mouth 2 (two) times daily with a meal. 09/12/18   Don Perking, [place], MD  neomycin-bacitracin-polymyxin (NEOSPORIN) ointment Apply 1 application topically every 12 (twelve) hours. 09/04/19   Enid Derry, PA-C    Allergies Patient has no known allergies.  Family History  Problem Relation Age of Onset  . Skin cancer Mother   . Diabetes Mother   . Thyroid cancer Maternal Grandfather   . Heart attack Maternal Grandfather   . Diabetes Maternal Grandfather     Social History Social History   Tobacco Use  . Smoking status: Current Every Day Smoker    Packs/day: 0.50    Types: Cigarettes  . Smokeless tobacco: Never Used  Substance Use Topics  . Alcohol use: Yes    Alcohol/week: 0.0 standard drinks  . Drug use: Not Currently    Types: Marijuana     Review of Systems  Constitutional: No fever/chills ENT: No upper respiratory complaints. Cardiovascular: No chest pain. Respiratory: No cough. No SOB. Gastrointestinal: No abdominal pain.  No nausea, no vomiting.  Musculoskeletal: Positive for 2 toe pain. Skin: Negative  for rash, abrasions, lacerations, ecchymosis. Positive for foreign body. Neurological: Negative for headaches, numbness or tingling   ____________________________________________   PHYSICAL EXAM:  VITAL SIGNS: ED Triage Vitals  Enc Vitals Group     BP 09/04/19 1222 115/69     Pulse Rate 09/04/19 1222 88     Resp 09/04/19 1222 14     Temp 09/04/19 1222 98.3 F (36.8 C)     Temp Source 09/04/19 1222 Oral     SpO2 09/04/19 1222 96 %     Weight 09/04/19 1157 268 lb 15.4 oz (122 kg)     Height 09/04/19 1157 5\' 6"  (1.676 m)     Head Circumference --      Peak Flow --      Pain Score  09/04/19 1157 8     Pain Loc --      Pain Edu? --      Excl. in [place]? --      Constitutional: Alert and oriented. Well appearing and in no acute distress. Eyes: Conjunctivae are normal. PERRL. EOMI. Head: Atraumatic. ENT:      Ears:      Nose: No congestion/rhinnorhea.      Mouth/Throat: Mucous membranes are moist.  Neck: No stridor.   Cardiovascular: Normal rate, regular rhythm.  Good peripheral circulation. Respiratory: Normal respiratory effort without tachypnea or retractions. Lungs CTAB. Good air entry to the bases with no decreased or absent breath sounds. Musculoskeletal: Full range of motion to all extremities. No gross deformities appreciated. Neurologic:  Normal speech and language. No gross focal neurologic deficits are appreciated.  Skin:  Skin is warm, dry and intact. Wood splinter to plantar right great toe. Psychiatric: Mood and affect are normal. Speech and behavior are normal. Patient exhibits appropriate insight and judgement.   ____________________________________________   LABS (all labs ordered are listed, but only abnormal results are displayed)  Labs Reviewed - No data to display ____________________________________________  EKG   ____________________________________________  RADIOLOGY  No results found.  ____________________________________________    PROCEDURES  Procedure(s) performed:    .Foreign Body Removal  Date/Time: 09/04/2019 2:24 PM Performed by: Laban Emperor, PA-C Authorized by: Laban Emperor, PA-C  Consent: Verbal consent obtained. Risks and benefits: risks, benefits and alternatives were discussed Consent given by: patient Patient understanding: patient states understanding of the procedure being performed Patient identity confirmed: verbally with patient Body area: skin Anesthesia: digital block  Anesthesia: Local Anesthetic: lidocaine 1% without epinephrine  Sedation: Patient sedated: no  Patient restrained:  no Localization method: visualized and probed Removal mechanism: forceps, alligator forceps and scalpel Dressing: antibiotic ointment Tendon involvement: none Depth: subcutaneous Complexity: complex 1 objects recovered. Objects recovered: splinter Post-procedure assessment: foreign body removed Patient tolerance: patient tolerated the procedure well with no immediate complications      Medications  Tdap (BOOSTRIX) injection 0.5 mL (0.5 mLs Intramuscular Given 09/04/19 1345)  lidocaine (PF) (XYLOCAINE) 1 % injection 5 mL (5 mLs Intradermal Given 09/04/19 1344)  neomycin-bacitracin-polymyxin (NEOSPORIN) ointment (1 application Topical Given 09/04/19 1450)     ____________________________________________   INITIAL IMPRESSION / ASSESSMENT AND PLAN / ED COURSE  Pertinent labs & imaging results that were available during my care of the patient were reviewed by me and considered in my medical decision making (see chart for details).  Review of the [place] CSRS was performed in accordance of the [place] prior to dispensing any controlled drugs.   Patient presented to the emergency department for evaluation of wood splinter foreign body to the  bottom of his right great toe today.  Vital signs and exam are reassuring.  Tetanus shot was updated.  Foreign body was removed.  Toe was bandaged.  Patient will be discharged home with prescriptions for Neosporin and Keflex. Patient is to follow up with primary care as directed. Patient is given ED precautions to return to the ED for any worsening or new symptoms.   Ronald Montgomery was evaluated in Emergency Department on 09/04/2019 for the symptoms described in the history of present illness. He was evaluated in the context of the global COVID-19 pandemic, which necessitated consideration that the patient might be at risk for infection with the SARS-CoV-2 virus that causes COVID-19. Institutional protocols and algorithms that pertain to the evaluation of patients  at risk for COVID-19 are in a state of rapid change based on information released by regulatory bodies including the CDC and federal and state organizations. These policies and algorithms were followed during the patient's care in the ED.  ____________________________________________  FINAL CLINICAL IMPRESSION(S) / ED DIAGNOSES  Final diagnoses:  Foreign body in skin      NEW MEDICATIONS STARTED DURING THIS VISIT:  ED Discharge Orders         Ordered    cephALEXin (KEFLEX) 500 MG capsule  3 times daily     09/04/19 1428    neomycin-bacitracin-polymyxin (NEOSPORIN) ointment  Every 12 hours     09/04/19 1428              This chart was dictated using voice recognition software/Dragon. Despite best efforts to proofread, errors can occur which can change the meaning. Any change was purely unintentional.    Enid Derry, PA-C 09/04/19 1530    Dionne Bucy, MD 09/04/19 [phone]

## 2019-09-11 ENCOUNTER — Ambulatory Visit: Payer: Self-pay | Admitting: Internal Medicine
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
## 2019-12-05 ENCOUNTER — Telehealth: Payer: Self-pay | Admitting: Internal Medicine

## 2019-12-05 NOTE — Telephone Encounter (Signed)
Individual has been attempted to be contacted regarding ED referral. They now have insurance and will not be continued to be reached out to.

## 2020-01-09 ENCOUNTER — Encounter: Payer: Self-pay | Admitting: Emergency Medicine

## 2020-01-09 ENCOUNTER — Emergency Department
Admission: EM | Admit: 2020-01-09 | Discharge: 2020-01-09 | Disposition: A | Discharge status: Home / Self Care | Payer: Managed Care, Other (non HMO) | Attending: Student in an Organized Health Care Education/Training Program | Admitting: Student in an Organized Health Care Education/Training Program

## 2020-01-09 ENCOUNTER — Other Ambulatory Visit: Payer: Self-pay

## 2020-01-09 DIAGNOSIS — Z7984 Long term (current) use of oral hypoglycemic drugs: Secondary | ICD-10-CM | POA: Insufficient documentation

## 2020-01-09 DIAGNOSIS — Z113 Encounter for screening for infections with a predominantly sexual mode of transmission: Secondary | ICD-10-CM | POA: Diagnosis not present

## 2020-01-09 DIAGNOSIS — J45909 Unspecified asthma, uncomplicated: Secondary | ICD-10-CM | POA: Insufficient documentation

## 2020-01-09 DIAGNOSIS — F17210 Nicotine dependence, cigarettes, uncomplicated: Secondary | ICD-10-CM | POA: Diagnosis not present

## 2020-01-09 DIAGNOSIS — E119 Type 2 diabetes mellitus without complications: Secondary | ICD-10-CM | POA: Insufficient documentation

## 2020-01-09 DIAGNOSIS — Z20822 Contact with and (suspected) exposure to covid-19: Secondary | ICD-10-CM | POA: Diagnosis not present

## 2020-01-09 DIAGNOSIS — J069 Acute upper respiratory infection, unspecified: Secondary | ICD-10-CM | POA: Diagnosis present

## 2020-01-09 DIAGNOSIS — N481 Balanitis: Secondary | ICD-10-CM

## 2020-01-09 LAB — URINALYSIS, COMPLETE (UACMP) WITH MICROSCOPIC
Bacteria, UA: NONE SEEN
Bilirubin Urine: NEGATIVE
Glucose, UA: 50 mg/dL — AB
Hgb urine dipstick: NEGATIVE
Ketones, ur: 5 mg/dL — AB
Leukocytes,Ua: NEGATIVE
Nitrite: NEGATIVE
Protein, ur: NEGATIVE mg/dL
Specific Gravity, Urine: 1.029 (ref 1.005–1.030)
pH: 5 (ref 5.0–8.0)

## 2020-01-09 LAB — GLUCOSE, CAPILLARY: Glucose-Capillary: 165 mg/dL — ABNORMAL HIGH (ref 70–99)

## 2020-01-09 LAB — SARS CORONAVIRUS 2 (TAT 6-24 HRS): SARS Coronavirus 2: NEGATIVE

## 2020-01-09 LAB — CHLAMYDIA/NGC RT PCR (ARMC ONLY)
Chlamydia Tr: NOT DETECTED
N gonorrhoeae: NOT DETECTED

## 2020-01-09 LAB — GROUP A STREP BY PCR: Group A Strep by PCR: NOT DETECTED

## 2020-01-09 MED ORDER — BENZONATATE 100 MG PO CAPS: 100.0000 mg | ORAL_CAPSULE | Freq: Three times a day (TID) | ORAL | 0 refills | Status: DC | PRN

## 2020-01-09 MED ORDER — CEFTRIAXONE SODIUM 1 G IJ SOLR
500.0000 mg | Freq: Once | INTRAMUSCULAR | Status: AC
  Administered 2020-01-09: 500 mg via INTRAMUSCULAR
  Filled 2020-01-09: qty 10

## 2020-01-09 MED ORDER — CLOTRIMAZOLE 1 % EX CREA: 1.0000 "application " | TOPICAL_CREAM | Freq: Two times a day (BID) | CUTANEOUS | 0 refills | Status: DC

## 2020-01-09 MED ORDER — AZITHROMYCIN 500 MG PO TABS
1000.0000 mg | ORAL_TABLET | Freq: Once | ORAL | Status: AC
  Administered 2020-01-09: 1000 mg via ORAL
  Filled 2020-01-09: qty 2

## 2020-01-09 NOTE — ED Triage Notes (Signed)
Patient reports having unprotected days 13 days ago. Reports 10 days ago he started feeling sick. States he has had intermittent headache and sore throat. One episode of vomiting about a week ago. Also reports back pain and groin pain.

## 2020-01-09 NOTE — ED Notes (Signed)
Patient declined discharge vital signs. 

## 2020-01-09 NOTE — ED Provider Notes (Signed)
[facility] Emergency Department Provider Note  ____________________________________________  Time seen: Approximately 10:08 AM  I have reviewed the triage vital signs and the nursing notes.   HISTORY  Chief Complaint Sore Throat and Groin Pain    HPI Ronald Montgomery is a 39 y.o. male that presents to emergency department for STD test and Covid test.  Patient states that he had unprotected sexual intercourse with a new partner 13 days ago.  Ten days ago he developed nasal congestion, a sore throat, occasional cough.   URI symptoms are improving.  He had an episode of vomiting 1 week ago.  He also has intermittent pain to his groin on both sides.  Sometimes he has low back pain.  He is unsure of penile discharge because he "is not sure what to look for."  He has a rash to the tip of his penis that developed last night.  Rash does not itch, hurt, or burn.  He had constipation once this week and had 2 episodes of loose stools this morning.  He would like to be tested for COVID-19.  He is unsure of fever.  No shortness of breath, chest pain, abdominal pain, testicular pain, dysuria, hematuria.   Past Medical History:  Diagnosis Date  . Alcohol abuse   . Asthma   . Chlamydia   . Constipation   . Diabetes mellitus without complication (HCC)   . Difficult intubation   . Penile pain 02/03/2015   Normal exams x several   . Scabies   . Testicular/scrotal pain 02/03/2015   Normal exam and [place] x several.    . Thyroid disorder    during childhood    Patient Active Problem List   Diagnosis Date Noted  . Epiglottitis 07/07/2015  . Penile pain 02/03/2015  . Testicular/scrotal pain 02/03/2015    Past Surgical History:  Procedure Laterality Date  . ABCESS DRAINAGE     groin area  . CYST EXCISION     top of head  . DIRECT LARYNGOSCOPY  07/07/2015   Procedure: DIRECT LARYNGOSCOPY;  Surgeon: Linus Salmons, MD;  Location: [facility] ORS;  Service: ENT;;  . INCISION AND  DRAINAGE ABSCESS N/A 07/07/2015   Procedure: INCISION AND DRAINAGE ABSCESS;  Surgeon: Linus Salmons, MD;  Location: [facility] ORS;  Service: ENT;  Laterality: N/A;  epiglottic abscess  . RHYTIDECTOMY NECK / CHEEK / CHIN     due to accident  . TONSILLECTOMY Bilateral 06/13/2017   Procedure: TONSILLECTOMY;  Surgeon: Linus Salmons, MD;  Location: [facility] ORS;  Service: ENT;  Laterality: Bilateral;    Prior to Admission medications   Medication Sig Start Date End Date Taking? Authorizing Provider  benzonatate (TESSALON PERLES) 100 MG capsule Take 1 capsule (100 mg total) by mouth 3 (three) times daily as needed. 01/09/20 01/08/21  Enid Derry, PA-C  clotrimazole (LOTRIMIN) 1 % cream Apply 1 application topically 2 (two) times daily. 01/09/20   Enid Derry, PA-C  metFORMIN (GLUCOPHAGE) 500 MG tablet Take 1 tablet (500 mg total) by mouth 2 (two) times daily with a meal. 09/12/18   Nita Sickle, MD    Allergies Patient has no known allergies.  Family History  Problem Relation Age of Onset  . Skin cancer Mother   . Diabetes Mother   . Thyroid cancer Maternal Grandfather   . Heart attack Maternal Grandfather   . Diabetes Maternal Grandfather     Social History Social History   Tobacco Use  . Smoking status: Current Every Day Smoker  Packs/day: 0.50    Types: Cigarettes  . Smokeless tobacco: Never Used  Vaping Use  . Vaping Use: Never used  Substance Use Topics  . Alcohol use: Yes    Alcohol/week: 0.0 standard drinks  . Drug use: Not Currently    Types: Marijuana     Review of Systems  Constitutional: No chills Eyes: No visual changes. No discharge. ENT: Positive for mild improving congestion and rhinorrhea. Cardiovascular: No chest pain. Respiratory: Positive for intermittent improving dry cough. No SOB. Gastrointestinal: No abdominal pain.  No nausea, no vomiting today.  No diarrhea.  No constipation today. Musculoskeletal: Positive for intermittent low back  pain. Skin: Negative for rash, abrasions, lacerations, ecchymosis. Neurological: Positive for headaches earlier this week.   ____________________________________________   PHYSICAL EXAM:  VITAL SIGNS: ED Triage Vitals  Enc Vitals Group     BP 01/09/20 0807 (!) 143/105     Pulse Rate 01/09/20 0807 93     Resp 01/09/20 0807 16     Temp 01/09/20 0807 99 F (37.2 C)     Temp Source 01/09/20 0807 Oral     SpO2 01/09/20 0807 98 %     Weight 01/09/20 0809 265 lb (120.2 kg)     Height 01/09/20 0809 5\' 6"  (1.676 m)     Head Circumference --      Peak Flow --      Pain Score 01/09/20 0808 1     Pain Loc --      Pain Edu? --      Excl. in [place]? --      Constitutional: Alert and oriented. Well appearing and in no acute distress. Eyes: Conjunctivae are normal. PERRL. EOMI. No discharge. Head: Atraumatic. ENT: No frontal and maxillary sinus tenderness.      Ears:       Nose: Mild congestion/rhinnorhea.      Mouth/Throat: Mucous membranes are moist. Oropharynx non-erythematous. Tonsils not enlarged. No exudates. Uvula midline. Neck: No stridor.   Hematological/Lymphatic/Immunilogical: No cervical lymphadenopathy. Cardiovascular: Normal rate, regular rhythm.  Good peripheral circulation. Respiratory: Normal respiratory effort without tachypnea or retractions. Lungs CTAB. Good air entry to the bases with no decreased or absent breath sounds. Gastrointestinal: Bowel sounds 4 quadrants. Soft and nontender to palpation. No guarding or rigidity. No palpable masses. No distention. Penile: RN present for exam.  Small 1 mm pink papules to tip of penis.  Nontender.  No vesicles. Musculoskeletal: Full range of motion to all extremities. No gross deformities appreciated. Neurologic:  Normal speech and language. No gross focal neurologic deficits are appreciated.  Skin:  Skin is warm, dry and intact. No rash noted. Psychiatric: Mood and affect are normal. Speech and behavior are normal. Patient  exhibits appropriate insight and judgement.   ____________________________________________   LABS (all labs ordered are listed, but only abnormal results are displayed)  Labs Reviewed  URINALYSIS, COMPLETE (UACMP) WITH MICROSCOPIC - Abnormal; Notable for the following components:      Result Value   Color, Urine AMBER (*)    APPearance HAZY (*)    Glucose, UA 50 (*)    Ketones, ur 5 (*)    All other components within normal limits  GLUCOSE, CAPILLARY - Abnormal; Notable for the following components:   Glucose-Capillary 165 (*)    All other components within normal limits  CHLAMYDIA/NGC RT PCR ([facility] ONLY)  GROUP A STREP BY PCR  SARS CORONAVIRUS 2 (TAT 6-24 HRS)   ____________________________________________  EKG   ____________________________________________  RADIOLOGY  No results found.  ____________________________________________    PROCEDURES  Procedure(s) performed:    Procedures    Medications  cefTRIAXone (ROCEPHIN) injection 500 mg (500 mg Intramuscular Given 01/09/20 1051)  azithromycin (ZITHROMAX) tablet 1,000 mg (1,000 mg Oral Given 01/09/20 1051)     ____________________________________________   INITIAL IMPRESSION / ASSESSMENT AND PLAN / ED COURSE  Pertinent labs & imaging results that were available during my care of the patient were reviewed by me and considered in my medical decision making (see chart for details).  Review of the [place] CSRS was performed in accordance of the [facility] prior to dispensing any controlled drugs.   Patient's diagnosis is consistent with URI, balanitis, STD screening. Vital signs and exam are reassuring.  Patient was given IM Rocephin and oral azithromycin to cover for gonorrhea and chlamydia.  Rash to the tip of the penis appears to be balanitis.  No indication of herpes. Covid test is pending. Patient appears well and is staying well hydrated. Patient feels comfortable going home. Patient will be discharged  home with prescriptions for Tessalon Perles and clotrimazole. Patient is to follow up with primary care as needed or otherwise directed. Patient is given ED precautions to return to the ED for any worsening or new symptoms.   MORGAN RENNERT was evaluated in Emergency Department on 01/09/2020 for the symptoms described in the history of present illness. He was evaluated in the context of the global COVID-19 pandemic, which necessitated consideration that the patient might be at risk for infection with the SARS-CoV-2 virus that causes COVID-19. Institutional protocols and algorithms that pertain to the evaluation of patients at risk for COVID-19 are in a state of rapid change based on information released by regulatory bodies including the CDC and federal and state organizations. These policies and algorithms were followed during the patient's care in the ED.  ____________________________________________  FINAL CLINICAL IMPRESSION(S) / ED DIAGNOSES  Final diagnoses:  Upper respiratory tract infection, unspecified type  Balanitis  Screening for STD (sexually transmitted disease)      NEW MEDICATIONS STARTED DURING THIS VISIT:  ED Discharge Orders         Ordered    benzonatate (TESSALON PERLES) 100 MG capsule  3 times daily PRN     Discontinue  Reprint     01/09/20 1041    clotrimazole (LOTRIMIN) 1 % cream  2 times daily     Discontinue  Reprint     01/09/20 1041              This chart was dictated using voice recognition software/Dragon. Despite best efforts to proofread, errors can occur which can change the meaning. Any change was purely unintentional.    Enid Derry, PA-C 01/09/20 1544    Willy Eddy, MD 01/09/20 [phone]

## 2020-01-09 NOTE — ED Notes (Signed)
See triage note  Presents with lower back pain with sore throat and feeling "bad"  States theses sx's started after having unprotected sex  Low grade temp on arrival

## 2020-01-24 ENCOUNTER — Emergency Department: Payer: Managed Care, Other (non HMO)

## 2020-01-24 ENCOUNTER — Other Ambulatory Visit: Payer: Self-pay

## 2020-01-24 DIAGNOSIS — J069 Acute upper respiratory infection, unspecified: Secondary | ICD-10-CM | POA: Insufficient documentation

## 2020-01-24 DIAGNOSIS — R0981 Nasal congestion: Secondary | ICD-10-CM | POA: Insufficient documentation

## 2020-01-24 DIAGNOSIS — R1030 Lower abdominal pain, unspecified: Secondary | ICD-10-CM | POA: Insufficient documentation

## 2020-01-24 DIAGNOSIS — J45909 Unspecified asthma, uncomplicated: Secondary | ICD-10-CM | POA: Diagnosis not present

## 2020-01-24 DIAGNOSIS — R300 Dysuria: Secondary | ICD-10-CM | POA: Insufficient documentation

## 2020-01-24 DIAGNOSIS — Z20822 Contact with and (suspected) exposure to covid-19: Secondary | ICD-10-CM | POA: Insufficient documentation

## 2020-01-24 DIAGNOSIS — E119 Type 2 diabetes mellitus without complications: Secondary | ICD-10-CM | POA: Diagnosis not present

## 2020-01-24 DIAGNOSIS — F17210 Nicotine dependence, cigarettes, uncomplicated: Secondary | ICD-10-CM | POA: Diagnosis not present

## 2020-01-24 DIAGNOSIS — R05 Cough: Secondary | ICD-10-CM | POA: Diagnosis present

## 2020-01-24 DIAGNOSIS — Z7984 Long term (current) use of oral hypoglycemic drugs: Secondary | ICD-10-CM | POA: Diagnosis not present

## 2020-01-24 LAB — URINALYSIS, COMPLETE (UACMP) WITH MICROSCOPIC
Bacteria, UA: NONE SEEN
Bilirubin Urine: NEGATIVE
Glucose, UA: >=500 mg/dL — AB
Hgb urine dipstick: NEGATIVE
Ketones, ur: NEGATIVE mg/dL
Leukocytes,Ua: NEGATIVE
Nitrite: NEGATIVE
Protein, ur: NEGATIVE mg/dL
Specific Gravity, Urine: 1.03 (ref 1.005–1.030)
pH: 7 (ref 5.0–8.0)

## 2020-01-24 LAB — CBC
HCT: 44.8 % (ref 39.0–52.0)
Hemoglobin: 15.6 g/dL (ref 13.0–17.0)
MCH: 32.6 pg (ref 26.0–34.0)
MCHC: 34.8 g/dL (ref 30.0–36.0)
MCV: 93.7 fL (ref 80.0–100.0)
Platelets: 156 10*3/uL (ref 150–400)
RBC: 4.78 MIL/uL (ref 4.22–5.81)
RDW: 12.6 % (ref 11.5–15.5)
WBC: 8.1 10*3/uL (ref 4.0–10.5)
nRBC: 0 % (ref 0.0–0.2)

## 2020-01-24 LAB — COMPREHENSIVE METABOLIC PANEL
ALT: 122 U/L — ABNORMAL HIGH (ref 0–44)
AST: 81 U/L — ABNORMAL HIGH (ref 15–41)
Albumin: 3.9 g/dL (ref 3.5–5.0)
Alkaline Phosphatase: 59 U/L (ref 38–126)
Anion gap: 9 (ref 5–15)
BUN: 16 mg/dL (ref 6–20)
CO2: 25 mmol/L (ref 22–32)
Calcium: 8.5 mg/dL — ABNORMAL LOW (ref 8.9–10.3)
Chloride: 100 mmol/L (ref 98–111)
Creatinine, Ser: 0.92 mg/dL (ref 0.61–1.24)
GFR calc Af Amer: >60 mL/min (ref >60)
GFR calc non Af Amer: >60 mL/min (ref >60)
Glucose, Bld: 377 mg/dL — ABNORMAL HIGH (ref 70–99)
Potassium: 3.9 mmol/L (ref 3.5–5.1)
Sodium: 134 mmol/L — ABNORMAL LOW (ref 135–145)
Total Bilirubin: 1.1 mg/dL (ref 0.3–1.2)
Total Protein: 7.8 g/dL (ref 6.5–8.1)

## 2020-01-24 NOTE — ED Triage Notes (Signed)
Pt states feels like his groin is swollen. Pt states he feels like he has a cough and has congestion. Pt appears in no acute distress, denies vomiting, diarrhea

## 2020-01-25 ENCOUNTER — Emergency Department
Admission: EM | Admit: 2020-01-25 | Discharge: 2020-01-25 | Disposition: A | Discharge status: Home / Self Care | Payer: Managed Care, Other (non HMO) | Attending: Student in an Organized Health Care Education/Training Program | Admitting: Student in an Organized Health Care Education/Training Program

## 2020-01-25 DIAGNOSIS — R1030 Lower abdominal pain, unspecified: Secondary | ICD-10-CM | POA: Diagnosis not present

## 2020-01-25 DIAGNOSIS — J069 Acute upper respiratory infection, unspecified: Secondary | ICD-10-CM

## 2020-01-25 LAB — SARS CORONAVIRUS 2 BY RT PCR (HOSPITAL ORDER, PERFORMED IN ~~LOC~~ HOSPITAL LAB): SARS Coronavirus 2: NEGATIVE

## 2020-01-25 MED ORDER — DOXYCYCLINE HYCLATE 100 MG PO TABS
100.0000 mg | ORAL_TABLET | Freq: Once | ORAL | Status: AC
  Administered 2020-01-25: 100 mg via ORAL
  Filled 2020-01-25: qty 1

## 2020-01-25 MED ORDER — DOXYCYCLINE HYCLATE 100 MG PO TABS: 100.0000 mg | ORAL_TABLET | Freq: Two times a day (BID) | ORAL | 0 refills | Status: AC

## 2020-01-25 NOTE — ED Notes (Signed)
Pt to first nurse desk to check about wait time. Pt informed that there is one person waiting longer than him but that if someone sicker comes in they will go back before him since we see pts based on acuity and not wait time. Pt asked if we thought that he should just leave, pt informed that we cannot advise him of that.  Pt ambulatory without distress or difficulty.

## 2020-01-25 NOTE — ED Provider Notes (Signed)
[facility] Emergency Department Provider Note    First MD Initiated Contact with Patient 01/25/20 0827     (approximate)  I have reviewed the triage vital signs and the nursing notes.   HISTORY  Chief Complaint Groin Pain    HPI Ronald Montgomery is a 39 y.o. male below listed past medical history presents to the ER for evaluation of 3 days of cough congestion concern for Covid infection as his daughter is also sick with similar symptoms.  Is also been having episodes of groin pain is worse when he sitting.  Also having some dysuria and is recently tested for STI and urethritis but test was negative.  Was not treated empirically.  He denies any abdominal pain.  No testicular pain or swelling.  No hematuria.  No flank pain.    Past Medical History:  Diagnosis Date  . Alcohol abuse   . Asthma   . Chlamydia   . Constipation   . Diabetes mellitus without complication (HCC)   . Difficult intubation   . Penile pain 02/03/2015   Normal exams x several   . Scabies   . Testicular/scrotal pain 02/03/2015   Normal exam and US x several.    . Thyroid disorder    during childhood   Family History  Problem Relation Age of Onset  . Skin cancer Mother   . Diabetes Mother   . Thyroid cancer Maternal Grandfather   . Heart attack Maternal Grandfather   . Diabetes Maternal Grandfather    Past Surgical History:  Procedure Laterality Date  . ABCESS DRAINAGE     groin area  . CYST EXCISION     top of head  . DIRECT LARYNGOSCOPY  07/07/2015   Procedure: DIRECT LARYNGOSCOPY;  Surgeon: Linus Salmonshapman McQueen, MD;  Location: [facility] ORS;  Service: ENT;;  . INCISION AND DRAINAGE ABSCESS N/A 07/07/2015   Procedure: INCISION AND DRAINAGE ABSCESS;  Surgeon: Linus Salmonshapman McQueen, MD;  Location: [facility] ORS;  Service: ENT;  Laterality: N/A;  epiglottic abscess  . RHYTIDECTOMY NECK / CHEEK / CHIN     due to accident  . TONSILLECTOMY Bilateral 06/13/2017   Procedure: TONSILLECTOMY;  Surgeon:  Linus SalmonsMcQueen, Chapman, MD;  Location: [facility] ORS;  Service: ENT;  Laterality: Bilateral;   Patient Active Problem List   Diagnosis Date Noted  . Epiglottitis 07/07/2015  . Penile pain 02/03/2015  . Testicular/scrotal pain 02/03/2015      Prior to Admission medications   Medication Sig Start Date End Date Taking? Authorizing Provider  benzonatate (TESSALON PERLES) 100 MG capsule Take 1 capsule (100 mg total) by mouth 3 (three) times daily as needed. 01/09/20 01/08/21  En[place] DerryWagner, Ashley, PA-C  clotrimazole (LOTRIMIN) 1 % cream Apply 1 application topically 2 (two) times daily. 01/09/20   [place] DerryWagner, Ashley, PA-C  doxycycline (VIBRA-TABS) 100 MG tablet Take 1 tablet (100 mg total) by mouth 2 (two) times daily for 7 days. 01/25/20 02/01/20  Willy Eddyobinson, Rodina Pinales, MD  metFORMIN (GLUCOPHAGE) 500 MG tablet Take 1 tablet (500 mg total) by mouth 2 (two) times daily with a meal. 09/12/18   Nita SickleVeronese, [place], MD    Allergies Patient has no known allergies.    Social History Social History   Tobacco Use  . Smoking status: Current Every Day Smoker    Packs/day: 0.50    Types: Cigarettes  . Smokeless tobacco: Never Used  Vaping Use  . Vaping Use: Never used  Substance Use Topics  . Alcohol use: Yes    Alcohol/week:  0.0 standard drinks  . Drug use: Not Currently    Types: Marijuana    Review of Systems Patient denies headaches, rhinorrhea, blurry vision, numbness, shortness of breath, chest pain, edema, cough, abdominal pain, nausea, vomiting, diarrhea, dysuria, fevers, rashes or hallucinations unless otherwise stated above in HPI. ____________________________________________   PHYSICAL EXAM:  VITAL SIGNS: Vitals:   01/24/20 2029 01/25/20 0723  BP: (!) 138/96 (!) 145/101  Pulse: 90 86  Resp: 16 16  Temp: 99 F (37.2 C)   SpO2: 100% 99%    Constitutional: Alert and oriented.  Eyes: Conjunctivae are normal.  Head: Atraumatic. Nose: No congestion/rhinnorhea. Mouth/Throat: Mucous membranes are  moist.   Neck: No stridor. Painless ROM.  Cardiovascular: Normal rate, regular rhythm. Grossly normal heart sounds.  Good peripheral circulation. Respiratory: Normal respiratory effort.  No retractions. Lungs CTAB. Gastrointestinal: Soft and nontender. No distention. No abdominal bruits. No CVA tenderness. Genitourinary: Normal external genitalia.  No lymphadenopathy, swelling or cellulitis Musculoskeletal: No lower extremity tenderness nor edema.  No joint effusions. Neurologic:  Normal speech and language. No gross focal neurologic deficits are appreciated. No facial droop Skin:  Skin is warm, dry and intact. No rash noted. Psychiatric: Mood and affect are normal. Speech and behavior are normal.  ____________________________________________   LABS (all labs ordered are listed, but only abnormal results are displayed)  Results for orders placed or performed during the hospital encounter of 01/25/20 (from the past 24 hour(s))  Comprehensive metabolic panel     Status: Abnormal   Collection Time: 01/24/20  8:34 PM  Result Value Ref Range   Sodium 134 (L) 135 - 145 mmol/L   Potassium 3.9 3.5 - 5.1 mmol/L   Chloride 100 98 - 111 mmol/L   CO2 25 22 - 32 mmol/L   Glucose, Bld 377 (H) 70 - 99 mg/dL   BUN 16 6 - 20 mg/dL   Creatinine, Ser 9.62 0.61 - 1.24 mg/dL   Calcium 8.5 (L) 8.9 - 10.3 mg/dL   Total Protein 7.8 6.5 - 8.1 g/dL   Albumin 3.9 3.5 - 5.0 g/dL   AST 81 (H) 15 - 41 U/L   ALT 122 (H) 0 - 44 U/L   Alkaline Phosphatase 59 38 - 126 U/L   Total Bilirubin 1.1 0.3 - 1.2 mg/dL   GFR calc non Af Amer >60 >60 mL/min   GFR calc Af Amer >60 >60 mL/min   Anion gap 9 5 - 15  CBC     Status: None   Collection Time: 01/24/20  8:34 PM  Result Value Ref Range   WBC 8.1 4.0 - 10.5 K/uL   RBC 4.78 4.22 - 5.81 MIL/uL   Hemoglobin 15.6 13.0 - 17.0 g/dL   HCT 22.9 39 - 52 %   MCV 93.7 80.0 - 100.0 fL   MCH 32.6 26.0 - 34.0 pg   MCHC 34.8 30.0 - 36.0 g/dL   RDW 79.8 92.1 - 19.4 %    Platelets 156 150 - 400 K/uL   nRBC 0.0 0.0 - 0.2 %  Urinalysis, Complete w Microscopic     Status: Abnormal   Collection Time: 01/24/20  8:34 PM  Result Value Ref Range   Color, Urine YELLOW (A) YELLOW   APPearance CLEAR (A) CLEAR   Specific Gravity, Urine 1.030 1.005 - 1.030   pH 7.0 5.0 - 8.0   Glucose, UA >=500 (A) NEGATIVE mg/dL   Hgb urine dipstick NEGATIVE NEGATIVE   Bilirubin Urine NEGATIVE NEGATIVE   Ketones,  ur NEGATIVE NEGATIVE mg/dL   Protein, ur NEGATIVE NEGATIVE mg/dL   Nitrite NEGATIVE NEGATIVE   Leukocytes,Ua NEGATIVE NEGATIVE   WBC, UA 0-5 0 - 5 WBC/hpf   Bacteria, UA NONE SEEN NONE SEEN   Squamous Epithelial / LPF 0-5 0 - 5   Mucus PRESENT   SARS Coronavirus 2 by RT PCR (hospital order, performed in [facility] hospital lab) Nasopharyngeal Nasopharyngeal Swab     Status: None   Collection Time: 01/25/20  2:36 AM   Specimen: Nasopharyngeal Swab  Result Value Ref Range   SARS Coronavirus 2 NEGATIVE NEGATIVE   ________________________________________________________________________________________  RADIOLOGY  I personally reviewed all radiographic images ordered to evaluate for the above acute complaints and reviewed radiology reports and findings.  These findings were personally discussed with the patient.  Please see medical record for radiology report.   ____________________________________________   PROCEDURES  Procedure(s) performed:  Procedures    Critical Care performed: no ____________________________________________   INITIAL IMPRESSION / ASSESSMENT AND PLAN / ED COURSE  Pertinent labs & imaging results that were available during my care of the patient were reviewed by me and considered in my medical decision making (see chart for details).   DDX: URI, hernia, torsion, cystitis, urethritis, Covid, pneumonia, pharyngitis  Ronald Montgomery is a 39 y.o. who presents to the ED with symptoms as described above.  He is afebrile hemodynamically  stable clinically well-appearing.  Blood work is reassuring.  His abdominal exam is soft and benign.  I appreciate any evidence of hernia.  His scrotal exam is benign.  Not consistent with torsion.  Seems to be bilateral inguinal discomfort that is worse with positioning.  May been secondary to injury or heavy lifting which the patient endorses.  Do not feel that further diagnostic imaging clinically indicated at this time.  Chest x-ray without any pneumonia.  His Covid test is negative but I discussed with patient the possibility of a false negative test and have recommended repeat testing in 48 hours particularly if symptoms not improving.  Also recommended quarantine and conservative management.  Given his symptoms of urethritis discussed options for further observation, repeat testing versus trial of doxycycline.  Patient agrees to try doxycycline.  Given his reassuring work-up I do not believe that he requires hospitalization at this time.  Have discussed with the patient and available family all diagnostics and treatments performed thus far and all questions were answered to the best of my ability. The patient demonstrates understanding and agreement with plan.       The patient was evaluated in Emergency Department today for the symptoms described in the history of present illness. He/she was evaluated in the context of the global COVID-19 pandemic, which necessitated consideration that the patient might be at risk for infection with the SARS-CoV-2 virus that causes COVID-19. Institutional protocols and algorithms that pertain to the evaluation of patients at risk for COVID-19 are in a state of rapid change based on information released by regulatory bodies including the CDC and federal and state organizations. These policies and algorithms were followed during the patient's care in the ED.  As part of my medical decision making, I reviewed the following data within the electronic MEDICAL RECORD NUMBER  Nursing notes reviewed and incorporated, Labs reviewed, notes from prior ED visits and [place] Controlled Substance Database   ____________________________________________   FINAL CLINICAL IMPRESSION(S) / ED DIAGNOSES  Final diagnoses:  Inguinal pain, unspecified laterality  Upper respiratory tract infection, unspecified type  NEW MEDICATIONS STARTED DURING THIS VISIT:  New Prescriptions   DOXYCYCLINE (VIBRA-TABS) 100 MG TABLET    Take 1 tablet (100 mg total) by mouth 2 (two) times daily for 7 days.     Note:  This document was prepared using Dragon voice recognition software and may include unintentional dictation errors.    Willy Eddy, MD 01/25/20 [phone]
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
## 2020-05-01 ENCOUNTER — Ambulatory Visit (INDEPENDENT_AMBULATORY_CARE_PROVIDER_SITE_OTHER): Payer: Managed Care, Other (non HMO) | Admitting: Adult Health

## 2020-05-01 ENCOUNTER — Other Ambulatory Visit: Payer: Self-pay

## 2020-05-01 ENCOUNTER — Encounter: Payer: Self-pay | Admitting: Adult Health

## 2020-05-01 VITALS — BP 138/85 | HR 84 | Temp 98.3°F | Resp 16 | Ht 66.0 in | Wt 270.2 lb

## 2020-05-01 DIAGNOSIS — Z72820 Sleep deprivation: Secondary | ICD-10-CM

## 2020-05-01 DIAGNOSIS — M5442 Lumbago with sciatica, left side: Secondary | ICD-10-CM

## 2020-05-01 DIAGNOSIS — E559 Vitamin D deficiency, unspecified: Secondary | ICD-10-CM

## 2020-05-01 DIAGNOSIS — M545 Low back pain, unspecified: Secondary | ICD-10-CM | POA: Insufficient documentation

## 2020-05-01 DIAGNOSIS — Z6841 Body Mass Index (BMI) 40.0 and over, adult: Secondary | ICD-10-CM

## 2020-05-01 DIAGNOSIS — G8929 Other chronic pain: Secondary | ICD-10-CM

## 2020-05-01 DIAGNOSIS — F17200 Nicotine dependence, unspecified, uncomplicated: Secondary | ICD-10-CM

## 2020-05-01 DIAGNOSIS — R5383 Other fatigue: Secondary | ICD-10-CM

## 2020-05-01 DIAGNOSIS — R202 Paresthesia of skin: Secondary | ICD-10-CM

## 2020-05-01 DIAGNOSIS — Z1389 Encounter for screening for other disorder: Secondary | ICD-10-CM

## 2020-05-01 DIAGNOSIS — M2560 Stiffness of unspecified joint, not elsewhere classified: Secondary | ICD-10-CM

## 2020-05-01 DIAGNOSIS — R0683 Snoring: Secondary | ICD-10-CM

## 2020-05-01 DIAGNOSIS — M5441 Lumbago with sciatica, right side: Secondary | ICD-10-CM

## 2020-05-01 DIAGNOSIS — E119 Type 2 diabetes mellitus without complications: Secondary | ICD-10-CM | POA: Diagnosis not present

## 2020-05-01 LAB — POCT URINALYSIS DIPSTICK
Bilirubin, UA: NEGATIVE
Blood, UA: NEGATIVE
Glucose, UA: POSITIVE — AB
Ketones, UA: NEGATIVE
Leukocytes, UA: NEGATIVE
Nitrite, UA: NEGATIVE
Protein, UA: NEGATIVE
Spec Grav, UA: 1.01 (ref 1.010–1.025)
Urobilinogen, UA: 0.2 E.U./dL
pH, UA: 7 (ref 5.0–8.0)

## 2020-05-01 MED ORDER — BACLOFEN 10 MG PO TABS: 10.0000 mg | ORAL_TABLET | Freq: Three times a day (TID) | ORAL | 0 refills | Status: DC

## 2020-05-01 MED ORDER — GABAPENTIN 100 MG PO CAPS: 100.0000 mg | ORAL_CAPSULE | Freq: Two times a day (BID) | ORAL | 0 refills | Status: DC

## 2020-05-01 MED ORDER — PREDNISONE 10 MG (21) PO TBPK: ORAL_TABLET | ORAL | 0 refills | Status: DC

## 2020-05-01 NOTE — Patient Instructions (Addendum)
Baclofen tablets What is this medicine? BACLOFEN (BAK loe fen) helps relieve spasms and cramping of muscles. It may be used to treat symptoms of multiple sclerosis or spinal cord injury. This medicine may be used for other purposes; ask your health care provider or pharmacist if you have questions. COMMON BRAND NAME(S): ED Baclofen, Lioresal What should I tell my health care provider before I take this medicine? They need to know if you have any of these conditions:  kidney disease  seizures  stroke  an unusual or allergic reaction to baclofen, other medicines, foods, dyes, or preservatives  pregnant or trying to get pregnant  breast-feeding How should I use this medicine? Take this medicine by mouth. Swallow it with a drink of water. Follow the directions on the prescription label. Do not take more medicine than you are told to take. Talk to your pediatrician regarding the use of this medicine in children. Special care may be needed. Overdosage: If you think you have taken too much of this medicine contact a poison control center or emergency room at once. NOTE: This medicine is only for you. Do not share this medicine with others. What if I miss a dose? If you miss a dose, take it as soon as you can. If it is almost time for your next dose, take only that dose. Do not take double or extra doses. What may interact with this medicine? Do not take this medication with any of the following medicines:  narcotic medicines for cough This medicine may also interact with the following medications:  alcohol  antihistamines for allergy, cough and cold  certain medicines for anxiety or sleep  certain medicines for depression like amitriptyline, fluoxetine, sertraline  certain medicines for seizures like phenobarbital, primidone  general anesthetics like halothane, isoflurane, methoxyflurane, propofol  local anesthetics like lidocaine, pramoxine, tetracaine  medicines that relax  muscles for surgery  narcotic medicines for pain  phenothiazines like chlorpromazine, mesoridazine, prochlorperazine, thioridazine This list may not describe all possible interactions. Give your health care provider a list of all the medicines, herbs, non-prescription drugs, or dietary supplements you use. Also tell them if you smoke, drink alcohol, or use illegal drugs. Some items may interact with your medicine. What should I watch for while using this medicine? Tell your doctor or health care professional if your symptoms do not start to get better or if they get worse. Do not suddenly stop taking your medicine. If you do, you may develop a severe reaction. If your doctor wants you to stop the medicine, the dose will be slowly lowered over time to avoid any side effects. Follow the advice of your doctor. You may get drowsy or dizzy. Do not drive, use machinery, or do anything that needs mental alertness until you know how this medicine affects you. Do not stand or sit up quickly, especially if you are an older patient. This reduces the risk of dizzy or fainting spells. Alcohol may interfere with the effect of this medicine. Avoid alcoholic drinks. If you are taking another medicine that also causes drowsiness, you may have more side effects. Give your health care provider a list of all medicines you use. Your doctor will tell you how much medicine to take. Do not take more medicine than directed. Call emergency for help if you have problems breathing or unusual sleepiness. What side effects may I notice from receiving this medicine? Side effects that you should report to your doctor or health care professional as soon as  possible:  allergic reactions like skin rash, itching or hives, swelling of the face, lips, or tongue  breathing problems  changes in emotions or moods  changes in vision  chest pain  fast, irregular heartbeat  feeling faint or lightheaded,  falls  hallucinations  loss of balance or coordination  ringing of the ears  seizures  trouble passing urine or change in the amount of urine  trouble walking  unusually weak or tired Side effects that usually do not require medical attention (report to your doctor or health care professional if they continue or are bothersome):  changes in taste  confusion  constipation  diarrhea  dry mouth  headache  muscle weakness  nausea, vomiting  trouble sleeping This list may not describe all possible side effects. Call your doctor for medical advice about side effects. You may report side effects to FDA at 1-800-FDA-1088. Where should I keep my medicine? Keep out of the reach of children. Store at room temperature between 15 and 30 degrees C (59 and 86 degrees F). Keep container tightly closed. Throw away any unused medicine after the expiration date. NOTE: This sheet is a summary. It may not cover all possible information. If you have questions about this medicine, talk to your doctor, pharmacist, or health care provider.  2020 Elsevier/Gold Standard (2017-02-25 09:56:42) Health Maintenance, Male Adopting a healthy lifestyle and getting preventive care are important in promoting health and wellness. Ask your health care provider about:  The right schedule for you to have regular tests and exams.  Things you can do on your own to prevent diseases and keep yourself healthy. What should I know about diet, weight, and exercise? Eat a healthy diet   Eat a diet that includes plenty of vegetables, fruits, low-fat dairy products, and lean protein.  Do not eat a lot of foods that are high in solid fats, added sugars, or sodium. Maintain a healthy weight Body mass index (BMI) is a measurement that can be used to identify possible weight problems. It estimates body fat based on height and weight. Your health care provider can help determine your BMI and help you achieve or  maintain a healthy weight. Get regular exercise Get regular exercise. This is one of the most important things you can do for your health. Most adults should:  Exercise for at least 150 minutes each week. The exercise should increase your heart rate and make you sweat (moderate-intensity exercise).  Do strengthening exercises at least twice a week. This is in addition to the moderate-intensity exercise.  Spend less time sitting. Even light physical activity can be beneficial. Watch cholesterol and blood lipids Have your blood tested for lipids and cholesterol at 40 years of age, then have this test every 5 years. You may need to have your cholesterol levels checked more often if:  Your lipid or cholesterol levels are high.  You are older than 40 years of age.  You are at high risk for heart disease. What should I know about cancer screening? Many types of cancers can be detected early and may often be prevented. Depending on your health history and family history, you may need to have cancer screening at various ages. This may include screening for:  Colorectal cancer.  Prostate cancer.  Skin cancer.  Lung cancer. What should I know about heart disease, diabetes, and high blood pressure? Blood pressure and heart disease  High blood pressure causes heart disease and increases the risk of stroke. This is more  likely to develop in people who have high blood pressure readings, are of African descent, or are overweight.  Talk with your health care provider about your target blood pressure readings.  Have your blood pressure checked: ? Every 3-5 years if you are 62-110 years of age. ? Every year if you are 12 years old or older.  If you are between the ages of 107 and 45 and are a current or former smoker, ask your health care provider if you should have a one-time screening for abdominal aortic aneurysm (AAA). Diabetes Have regular diabetes screenings. This checks your fasting blood  sugar level. Have the screening done:  Once every three years after age 50 if you are at a normal weight and have a low risk for diabetes.  More often and at a younger age if you are overweight or have a high risk for diabetes. What should I know about preventing infection? Hepatitis B If you have a higher risk for hepatitis B, you should be screened for this virus. Talk with your health care provider to find out if you are at risk for hepatitis B infection. Hepatitis C Blood testing is recommended for:  Everyone born from [birth year] through 1966.  Anyone with known risk factors for hepatitis C. Sexually transmitted infections (STIs)  You should be screened each year for STIs, including gonorrhea and chlamydia, if: ? You are sexually active and are younger than 40 years of age. ? You are older than 40 years of age and your health care provider tells you that you are at risk for this type of infection. ? Your sexual activity has changed since you were last screened, and you are at increased risk for chlamydia or gonorrhea. Ask your health care provider if you are at risk.  Ask your health care provider about whether you are at high risk for HIV. Your health care provider may recommend a prescription medicine to help prevent HIV infection. If you choose to take medicine to prevent HIV, you should first get tested for HIV. You should then be tested every 3 months for as long as you are taking the medicine. Follow these instructions at home: Lifestyle  Do not use any products that contain nicotine or tobacco, such as cigarettes, e-cigarettes, and chewing tobacco. If you need help quitting, ask your health care provider.  Do not use street drugs.  Do not share needles.  Ask your health care provider for help if you need support or information about quitting drugs. Alcohol use  Do not drink alcohol if your health care provider tells you not to drink.  If you drink alcohol: ? Limit how much  you have to 0-2 drinks a day. ? Be aware of how much alcohol is in your drink. In the [place] one drink equals one 12 oz bottle of beer (355 mL), one 5 oz glass of wine (148 mL), or one 1 oz glass of hard liquor (44 mL). General instructions  Schedule regular health, dental, and eye exams.  Stay current with your vaccines.  Tell your health care provider if: ? You often feel depressed. ? You have ever been abused or do not feel safe at home. Summary  Adopting a healthy lifestyle and getting preventive care are important in promoting health and wellness.  Follow your health care provider's instructions about healthy diet, exercising, and getting tested or screened for diseases.  Follow your health care provider's instructions on monitoring your cholesterol and blood pressure. This information is  not intended to replace advice given to you by your health care provider. Make sure you discuss any questions you have with your health care provider. Document Revised: 05/09/2018 Document Reviewed: 05/09/2018 Elsevier Patient Education  2020 [employer] Inc.   Fat and Cholesterol Restricted Eating Plan Getting too much fat and cholesterol in your diet may cause health problems. Choosing the right foods helps keep your fat and cholesterol at normal levels. This can keep you from getting certain diseases. Your doctor may recommend an eating plan that includes:  Total fat: ______% or less of total calories a day.  Saturated fat: ______% or less of total calories a day.  Cholesterol: less than _________mg a day.  Fiber: ______g a day. What are tips for following this plan? Meal planning  At meals, divide your plate into four equal parts: ? Fill one-half of your plate with vegetables and green salads. ? Fill one-fourth of your plate with whole grains. ? Fill one-fourth of your plate with low-fat (lean) protein foods.  Eat fish that is high in omega-3 fats at least two times a week. This  includes mackerel, tuna, sardines, and salmon.  Eat foods that are high in fiber, such as whole grains, beans, apples, broccoli, carrots, peas, and barley. General tips   Work with your doctor to lose weight if you need to.  Avoid: ? Foods with added sugar. ? Fried foods. ? Foods with partially hydrogenated oils.  Limit alcohol intake to no more than 1 drink a day for nonpregnant women and 2 drinks a day for men. One drink equals 12 oz of beer, 5 oz of wine, or 1 oz of hard liquor. Reading food labels  Check food labels for: ? Trans fats. ? Partially hydrogenated oils. ? Saturated fat (g) in each serving. ? Cholesterol (mg) in each serving. ? Fiber (g) in each serving.  Choose foods with healthy fats, such as: ? Monounsaturated fats. ? Polyunsaturated fats. ? Omega-3 fats.  Choose grain products that have whole grains. Look for the word "whole" as the first word in the ingredient list. Cooking  Cook foods using low-fat methods. These include baking, boiling, grilling, and broiling.  Eat more home-cooked foods. Eat at restaurants and buffets less often.  Avoid cooking using saturated fats, such as butter, cream, palm oil, palm kernel oil, and coconut oil. Recommended foods  Fruits  All fresh, canned (in natural juice), or frozen fruits. Vegetables  Fresh or frozen vegetables (raw, steamed, roasted, or grilled). Green salads. Grains  Whole grains, such as whole wheat or whole grain breads, crackers, cereals, and pasta. Unsweetened oatmeal, bulgur, barley, quinoa, or brown rice. Corn or whole wheat flour tortillas. Meats and other protein foods  Ground beef (85% or leaner), grass-fed beef, or beef trimmed of fat. Skinless chicken or [place]. Ground chicken or [place]. Pork trimmed of fat. All fish and seafood. Egg whites. Dried beans, peas, or lentils. Unsalted nuts or seeds. Unsalted canned beans. Nut butters without added sugar or oil. Dairy  Low-fat or nonfat dairy  products, such as skim or 1% milk, 2% or reduced-fat cheeses, low-fat and fat-free ricotta or cottage cheese, or plain low-fat and nonfat yogurt. Fats and oils  Tub margarine without trans fats. Light or reduced-fat mayonnaise and salad dressings. Avocado. Olive, canola, sesame, or safflower oils. The items listed above may not be a complete list of foods and beverages you can eat. Contact a dietitian for more information. Foods to avoid Fruits  Canned fruit in heavy  syrup. Fruit in cream or butter sauce. Fried fruit. Vegetables  Vegetables cooked in cheese, cream, or butter sauce. Fried vegetables. Grains  White bread. White pasta. White rice. Cornbread. Bagels, pastries, and croissants. Crackers and snack foods that contain trans fat and hydrogenated oils. Meats and other protein foods  Fatty cuts of meat. Ribs, chicken wings, bacon, sausage, bologna, salami, chitterlings, fatback, hot dogs, bratwurst, and packaged lunch meats. Liver and organ meats. Whole eggs and egg yolks. Chicken and [place] with skin. Fried meat. Dairy  Whole or 2% milk, cream, half-and-half, and cream cheese. Whole milk cheeses. Whole-fat or sweetened yogurt. Full-fat cheeses. Nondairy creamers and whipped toppings. Processed cheese, cheese spreads, and cheese curds. Beverages  Alcohol. Sugar-sweetened drinks such as sodas, lemonade, and fruit drinks. Fats and oils  Butter, stick margarine, lard, shortening, ghee, or bacon fat. Coconut, palm kernel, and palm oils. Sweets and desserts  Corn syrup, sugars, honey, and molasses. Candy. Jam and jelly. Syrup. Sweetened cereals. Cookies, pies, cakes, donuts, muffins, and ice cream. The items listed above may not be a complete list of foods and beverages you should avoid. Contact a dietitian for more information. Summary  Choosing the right foods helps keep your fat and cholesterol at normal levels. This can keep you from getting certain diseases.  At meals, fill  one-half of your plate with vegetables and green salads.  Eat high-fiber foods, like whole grains, beans, apples, carrots, peas, and barley.  Limit added sugar, saturated fats, alcohol, and fried foods. This information is not intended to replace advice given to you by your health care provider. Make sure you discuss any questions you have with your health care provider. Document Revised: 01/17/2018 Document Reviewed: 01/31/2017 Elsevier Patient Education  2020 [employer] Inc. Gabapentin capsules or tablets What is this medicine? GABAPENTIN (GA ba pen tin) is used to control seizures in certain types of epilepsy. It is also used to treat certain types of nerve pain. This medicine may be used for other purposPrednisone tablets What is this medicine? PREDNISONE (PRED ni sone) is a corticosteroid. It is commonly used to treat inflammation of the skin, joints, lungs, and other organs. Common conditions treated include asthma, allergies, and arthritis. It is also used for other conditions, such as blood disorders and diseases of the adrenal glands. This medicine may be used for other purposes; ask your health care provider or pharmacist if you have questions. COMMON BRAND NAME(S): Deltasone, Predone, Sterapred, Sterapred DS What should I tell my health care provider before I take this medicine? They need to know if you have any of these conditions:  Cushing's syndrome  diabetes  glaucoma  heart disease  high blood pressure  infection (especially a virus infection such as chickenpox, cold sores, or herpes)  kidney disease  liver disease  mental illness  myasthenia gravis  osteoporosis  seizures  stomach or intestine problems  thyroid disease  an unusual or allergic reaction to lactose, prednisone, other medicines, foods, dyes, or preservatives  pregnant or trying to get pregnant  breast-feeding How should I use this medicine? Take this medicine by mouth with a glass of  water. Follow the directions on the prescription label. Take this medicine with food. If you are taking this medicine once a day, take it in the morning. Do not take more medicine than you are told to take. Do not suddenly stop taking your medicine because you may develop a severe reaction. Your doctor will tell you how much medicine to take. If your  doctor wants you to stop the medicine, the dose may be slowly lowered over time to avoid any side effects. Talk to your pediatrician regarding the use of this medicine in children. Special care may be needed. Overdosage: If you think you have taken too much of this medicine contact a poison control center or emergency room at once. NOTE: This medicine is only for you. Do not share this medicine with others. What if I miss a dose? If you miss a dose, take it as soon as you can. If it is almost time for your next dose, talk to your doctor or health care professional. You may need to miss a dose or take an extra dose. Do not take double or extra doses without advice. What may interact with this medicine? Do not take this medicine with any of the following medications:  metyrapone  mifepristone This medicine may also interact with the following medications:  aminoglutethimide  amphotericin B  aspirin and aspirin-like medicines  barbiturates  certain medicines for diabetes, like glipizide or glyburide  cholestyramine  cholinesterase inhibitors  cyclosporine  digoxin  diuretics  ephedrine  male hormones, like estrogens and birth control pills  isoniazid  ketoconazole  NSAIDS, medicines for pain and inflammation, like ibuprofen or naproxen  phenytoin  rifampin  toxoids  vaccines  warfarin This list may not describe all possible interactions. Give your health care provider a list of all the medicines, herbs, non-prescription drugs, or dietary supplements you use. Also tell them if you smoke, drink alcohol, or use illegal  drugs. Some items may interact with your medicine. What should I watch for while using this medicine? Visit your doctor or health care professional for regular checks on your progress. If you are taking this medicine over a prolonged period, carry an identification card with your name and address, the type and dose of your medicine, and your doctor's name and address. This medicine may increase your risk of getting an infection. Tell your doctor or health care professional if you are around anyone with measles or chickenpox, or if you develop sores or blisters that do not heal properly. If you are going to have surgery, tell your doctor or health care professional that you have taken this medicine within the last twelve months. Ask your doctor or health care professional about your diet. You may need to lower the amount of salt you eat. This medicine may increase blood sugar. Ask your healthcare provider if changes in diet or medicines are needed if you have diabetes. What side effects may I notice from receiving this medicine? Side effects that you should report to your doctor or health care professional as soon as possible:  allergic reactions like skin rash, itching or hives, swelling of the face, lips, or tongue  changes in emotions or moods  changes in vision  depressed mood  eye pain  fever or chills, cough, sore throat, pain or difficulty passing urine  signs and symptoms of high blood sugar such as being more thirsty or hungry or having to urinate more than normal. You may also feel very tired or have blurry vision.  swelling of ankles, feet Side effects that usually do not require medical attention (report to your doctor or health care professional if they continue or are bothersome):  confusion, excitement, restlessness  headache  nausea, vomiting  skin problems, acne, thin and shiny skin  trouble sleeping  weight gain This list may not describe all possible side  effects. Call your  doctor for medical advice about side effects. You may report side effects to FDA at 1-800-FDA-1088. Where should I keep my medicine? Keep out of the reach of children. Store at room temperature between 15 and 30 degrees C (59 and 86 degrees F). Protect from light. Keep container tightly closed. Throw away any unused medicine after the expiration date. NOTE: This sheet is a summary. It may not cover all possible information. If you have questions about this medicine, talk to your doctor, pharmacist, or health care provider.  2020 Elsevier/Gold Standard (2018-02-13 10:54:22) es; ask your health care provider or pharmacist if you have questions. COMMON BRAND NAME(S): Active-PAC with Gabapentin, Gabarone, Neurontin What should I tell my health care provider before I take this medicine? They need to know if you have any of these conditions:  history of drug abuse or alcohol abuse problem  kidney disease  lung or breathing disease  suicidal thoughts, plans, or attempt; a previous suicide attempt by you or a family member  an unusual or allergic reaction to gabapentin, other medicines, foods, dyes, or preservatives  pregnant or trying to get pregnant  breast-feeding How should I use this medicine? Take this medicine by mouth with a glass of water. Follow the directions on the prescription label. You can take it with or without food. If it upsets your stomach, take it with food. Take your medicine at regular intervals. Do not take it more often than directed. Do not stop taking except on your doctor's advice. If you are directed to break the 600 or 800 mg tablets in half as part of your dose, the extra half tablet should be used for the next dose. If you have not used the extra half tablet within 28 days, it should be thrown away. A special MedGuide will be given to you by the pharmacist with each prescription and refill. Be sure to read this information carefully each  time. Talk to your pediatrician regarding the use of this medicine in children. While this drug may be prescribed for children as young as 3 years for selected conditions, precautions do apply. Overdosage: If you think you have taken too much of this medicine contact a poison control center or emergency room at once. NOTE: This medicine is only for you. Do not share this medicine with others. What if I miss a dose? If you miss a dose, take it as soon as you can. If it is almost time for your next dose, take only that dose. Do not take double or extra doses. What may interact with this medicine? This medicine may interact with the following medications:  alcohol  antihistamines for allergy, cough, and cold  certain medicines for anxiety or sleep  certain medicines for depression like amitriptyline, fluoxetine, sertraline  certain medicines for seizures like phenobarbital, primidone  certain medicines for stomach problems  general anesthetics like halothane, isoflurane, methoxyflurane, propofol  local anesthetics like lidocaine, pramoxine, tetracaine  medicines that relax muscles for surgery  narcotic medicines for pain  phenothiazines like chlorpromazine, mesoridazine, prochlorperazine, thioridazine This list may not describe all possible interactions. Give your health care provider a list of all the medicines, herbs, non-prescription drugs, or dietary supplements you use. Also tell them if you smoke, drink alcohol, or use illegal drugs. Some items may interact with your medicine. What should I watch for while using this medicine? Visit your doctor or health care provider for regular checks on your progress. You may want to keep a record at home  of how you feel your condition is responding to treatment. You may want to share this information with your doctor or health care provider at each visit. You should contact your doctor or health care provider if your seizures get worse or if  you have any new types of seizures. Do not stop taking this medicine or any of your seizure medicines unless instructed by your doctor or health care provider. Stopping your medicine suddenly can increase your seizures or their severity. This medicine may cause serious skin reactions. They can happen weeks to months after starting the medicine. Contact your health care provider right away if you notice fevers or flu-like symptoms with a rash. The rash may be red or purple and then turn into blisters or peeling of the skin. Or, you might notice a red rash with swelling of the face, lips or lymph nodes in your neck or under your arms. Wear a medical identification bracelet or chain if you are taking this medicine for seizures, and carry a card that lists all your medications. You may get drowsy, dizzy, or have blurred vision. Do not drive, use machinery, or do anything that needs mental alertness until you know how this medicine affects you. To reduce dizzy or fainting spells, do not sit or stand up quickly, especially if you are an older patient. Alcohol can increase drowsiness and dizziness. Avoid alcoholic drinks. Your mouth may get dry. Chewing sugarless gum or sucking hard candy, and drinking plenty of water will help. The use of this medicine may increase the chance of suicidal thoughts or actions. Pay special attention to how you are responding while on this medicine. Any worsening of mood, or thoughts of suicide or dying should be reported to your health care provider right away. Women who become pregnant while using this medicine may enroll in the [place] Antiepileptic Drug Pregnancy Registry by calling [phone]. This registry collects information about the safety of antiepileptic drug use during pregnancy. What side effects may I notice from receiving this medicine? Side effects that you should report to your doctor or health care professional as soon as possible:  allergic reactions  like skin rash, itching or hives, swelling of the face, lips, or tongue  breathing problems  rash, fever, and swollen lymph nodes  redness, blistering, peeling or loosening of the skin, including inside the mouth  suicidal thoughts, mood changes Side effects that usually do not require medical attention (report to your doctor or health care professional if they continue or are bothersome):  dizziness  drowsiness  headache  nausea, vomiting  swelling of ankles, feet, hands  tiredness This list may not describe all possible side effects. Call your doctor for medical advice about side effects. You may report side effects to FDA at 1-800-FDA-1088. Where should I keep my medicine? Keep out of reach of children. This medicine may cause accidental overdose and death if it taken by other adults, children, or pets. Mix any unused medicine with a substance like cat litter or coffee grounds. Then throw the medicine away in a sealed container like a sealed bag or a coffee can with a lid. Do not use the medicine after the expiration date. Store at room temperature between 15 and 30 degrees C (59 and 86 degrees F). NOTE: This sheet is a summary. It may not cover all possible information. If you have questions about this medicine, talk to your doctor, pharmacist, or health care provider.  2020 Elsevier/Gold Standard (2018-08-17 14:16:43)
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
## 2020-05-01 NOTE — Progress Notes (Signed)
Glucose in urine, will need micro albumin urine next visit.

## 2020-05-04 ENCOUNTER — Other Ambulatory Visit: Payer: Self-pay

## 2020-05-04 ENCOUNTER — Ambulatory Visit
Admission: RE | Admit: 2020-05-04 | Discharge: 2020-05-04 | Disposition: A | Discharge status: Home / Self Care | Payer: Managed Care, Other (non HMO) | Attending: Adult Health | Admitting: Adult Health

## 2020-05-04 ENCOUNTER — Ambulatory Visit
Admission: RE | Admit: 2020-05-04 | Discharge: 2020-05-04 | Disposition: A | Discharge status: Home / Self Care | Payer: Managed Care, Other (non HMO) | Source: Ambulatory Visit | Attending: Adult Health | Admitting: Adult Health

## 2020-05-04 DIAGNOSIS — M5441 Lumbago with sciatica, right side: Secondary | ICD-10-CM | POA: Insufficient documentation

## 2020-05-04 DIAGNOSIS — F17200 Nicotine dependence, unspecified, uncomplicated: Secondary | ICD-10-CM | POA: Diagnosis present

## 2020-05-04 DIAGNOSIS — M5442 Lumbago with sciatica, left side: Secondary | ICD-10-CM | POA: Insufficient documentation

## 2020-05-04 DIAGNOSIS — G8929 Other chronic pain: Secondary | ICD-10-CM | POA: Diagnosis present

## 2020-05-06 ENCOUNTER — Telehealth: Payer: Self-pay

## 2020-05-06 ENCOUNTER — Other Ambulatory Visit: Payer: Self-pay | Admitting: Adult Health

## 2020-05-06 DIAGNOSIS — E119 Type 2 diabetes mellitus without complications: Secondary | ICD-10-CM

## 2020-05-06 DIAGNOSIS — E559 Vitamin D deficiency, unspecified: Secondary | ICD-10-CM

## 2020-05-06 DIAGNOSIS — R5383 Other fatigue: Secondary | ICD-10-CM

## 2020-05-06 DIAGNOSIS — D72820 Lymphocytosis (symptomatic): Secondary | ICD-10-CM | POA: Insufficient documentation

## 2020-05-06 LAB — COMPREHENSIVE METABOLIC PANEL
ALT: 106 IU/L — ABNORMAL HIGH (ref 0–44)
AST: 31 IU/L (ref 0–40)
Albumin/Globulin Ratio: 1.4 (ref 1.2–2.2)
Albumin: 4.5 g/dL (ref 4.0–5.0)
Alkaline Phosphatase: 85 IU/L (ref 44–121)
BUN/Creatinine Ratio: 13 (ref 9–20)
BUN: 15 mg/dL (ref 6–24)
Bilirubin Total: 0.2 mg/dL (ref 0.0–1.2)
CO2: 19 mmol/L — ABNORMAL LOW (ref 20–29)
Calcium: 9.7 mg/dL (ref 8.7–10.2)
Chloride: 95 mmol/L — ABNORMAL LOW (ref 96–106)
Creatinine, Ser: 1.13 mg/dL (ref 0.76–1.27)
GFR calc Af Amer: 93 mL/min/{1.73_m2} (ref >59)
GFR calc non Af Amer: 81 mL/min/{1.73_m2} (ref >59)
Globulin, Total: 3.2 g/dL (ref 1.5–4.5)
Glucose: 445 mg/dL — ABNORMAL HIGH (ref 65–99)
Potassium: 4.2 mmol/L (ref 3.5–5.2)
Sodium: 134 mmol/L (ref 134–144)
Total Protein: 7.7 g/dL (ref 6.0–8.5)

## 2020-05-06 LAB — ANA,IFA RA DIAG PNL W/RFLX TIT/PATN
ANA Titer 1: NEGATIVE
Cyclic Citrullin Peptide Ab: 7 units (ref 0–19)
Rheumatoid fact SerPl-aCnc: <10 IU/mL (ref <14.0)

## 2020-05-06 LAB — CBC WITH DIFFERENTIAL/PLATELET
Basophils Absolute: 0.1 10*3/uL (ref 0.0–0.2)
Basos: 1 %
EOS (ABSOLUTE): 0.1 10*3/uL (ref 0.0–0.4)
Eos: 1 %
Hematocrit: 45.8 % (ref 37.5–51.0)
Hemoglobin: 15.5 g/dL (ref 13.0–17.7)
Immature Grans (Abs): 0.1 10*3/uL (ref 0.0–0.1)
Immature Granulocytes: 1 %
Lymphocytes Absolute: 3.5 10*3/uL — ABNORMAL HIGH (ref 0.7–3.1)
Lymphs: 25 %
MCH: 32 pg (ref 26.6–33.0)
MCHC: 33.8 g/dL (ref 31.5–35.7)
MCV: 94 fL (ref 79–97)
Monocytes Absolute: 1 10*3/uL — ABNORMAL HIGH (ref 0.1–0.9)
Monocytes: 7 %
Neutrophils Absolute: 9.4 10*3/uL — ABNORMAL HIGH (ref 1.4–7.0)
Neutrophils: 65 %
Platelets: 187 10*3/uL (ref 150–450)
RBC: 4.85 x10E6/uL (ref 4.14–5.80)
RDW: 11.9 % (ref 11.6–15.4)
WBC: 14.2 10*3/uL — ABNORMAL HIGH (ref 3.4–10.8)

## 2020-05-06 LAB — LIPID PANEL W/O CHOL/HDL RATIO
Cholesterol, Total: 160 mg/dL (ref 100–199)
HDL: 42 mg/dL (ref >39)
LDL Chol Calc (NIH): 86 mg/dL (ref 0–99)
Triglycerides: 186 mg/dL — ABNORMAL HIGH (ref 0–149)
VLDL Cholesterol Cal: 32 mg/dL (ref 5–40)

## 2020-05-06 LAB — TSH: TSH: 2.79 u[IU]/mL (ref 0.450–4.500)

## 2020-05-06 LAB — HEMOGLOBIN A1C
Est. average glucose Bld gHb Est-mCnc: 209 mg/dL
Hgb A1c MFr Bld: 8.9 % — ABNORMAL HIGH (ref 4.8–5.6)

## 2020-05-06 LAB — VITAMIN D 25 HYDROXY (VIT D DEFICIENCY, FRACTURES): Vit D, 25-Hydroxy: 16.7 ng/mL — ABNORMAL LOW (ref 30.0–100.0)

## 2020-05-06 MED ORDER — METFORMIN HCL 500 MG PO TABS: 500.0000 mg | ORAL_TABLET | Freq: Two times a day (BID) | ORAL | 1 refills | Status: DC

## 2020-05-06 MED ORDER — VITAMIN D (ERGOCALCIFEROL) 1.25 MG (50000 UNIT) PO CAPS: 50000.0000 [IU] | ORAL_CAPSULE | ORAL | 0 refills | Status: DC

## 2020-05-06 MED ORDER — AMOXICILLIN-POT CLAVULANATE 875-125 MG PO TABS: 1.0000 | ORAL_TABLET | Freq: Two times a day (BID) | ORAL | 0 refills | Status: DC

## 2020-05-06 NOTE — Telephone Encounter (Signed)
Yes referral to orthopedics is advised. Please place referral and let him know he should hear in two weeks and that [facility] does have a walk in clinic open 1pm to 7 pm in [place] he can go there as well.

## 2020-05-06 NOTE — Progress Notes (Signed)
Add on hepatitis acute panel for elevated ALT. B12 was not drawn if you can still add on to labs.   He is still diabetic, he had been off his Glucophage, will start it  back at Metformin 500 mg BID with meals, recheck A1C in office in 2 months. Does he need meter, we can address at his appointment he has coming up if not or leave script per insurance for him to pick up.   Rheumatology labs are negative.  CBC shows elevated WBC, neutrophills and lymphocytes, sign of an infection. Chest x ray was within normal limits, I am going to send in Augmentin antibiotic for him to take, needs repeat CBC at next visit.   Triglycerides are a elevated likely due to uncontrolled diabetes and will likely improve with better control.   Discuss lifestyle modification with patient e.g. increase exercise, fiber, fruits, vegetables, lean meat, and omega 3/fish intake and decrease saturated fat.  If patient following strict diet and exercise program already please schedule follow up appointment with primary care physician  TSH for thyroid is within normal limits.  CMP with glucose of 445 at visit, needs to monitor fasting glucose at home.  ALT liver enzyme elevated, avoid alcohol and tylenol. It has improved from labs 3 months ago/. Will recheck in one month CMP.  Vitamin D is insufficient will send prescription vitamin d to pharmacy take Vitamin D as prescribed 50,000 international units by mouth once weekly( once every 7 days) for 12 weeks recheck lab around 1-2 weeks after completing prescription for additional dosing if needed.    Medications sent as below:  Meds ordered this encounter Medications  metFORMIN (GLUCOPHAGE) 500 MG tablet   Sig: Take 1 tablet (500 mg total) by mouth 2 (two) times daily with a meal.   Dispense:  60 tablet   Refill:  1    amoxicillin-clavulanate (AUGMENTIN) 875-125 MG tablet   Sig: Take 1 tablet by mouth 2 (two) times daily.   Dispense:  20 tablet   Refill:  0    Vitamin D,  Ergocalciferol, (DRISDOL) 1.25 MG (50000 UNIT) CAPS capsule   Sig: Take 1 capsule (50,000 Units total) by mouth every 7 (seven) days. (taking one tablet per week) walk in lab in office 1-2 weeks after completing prescription.   Dispense:  12 capsule   Refill:  0

## 2020-05-06 NOTE — Telephone Encounter (Signed)
Copied from CRM [id number]. Topic: General - Other >> May 06, 2020  2:54 PM Gwenlyn Fudge wrote: Reason for CRM: Pt called and is requesting to have a nurse give him a call back to discuss his lab results. Please advise.

## 2020-05-06 NOTE — Progress Notes (Signed)
Meds ordered this encounter  Medications  . metFORMIN (GLUCOPHAGE) 500 MG tablet    Sig: Take 1 tablet (500 mg total) by mouth 2 (two) times daily with a meal.    Dispense:  60 tablet    Refill:  1  . amoxicillin-clavulanate (AUGMENTIN) 875-125 MG tablet    Sig: Take 1 tablet by mouth 2 (two) times daily.    Dispense:  20 tablet    Refill:  0  . Vitamin D, Ergocalciferol, (DRISDOL) 1.25 MG (50000 UNIT) CAPS capsule    Sig: Take 1 capsule (50,000 Units total) by mouth every 7 (seven) days. (taking one tablet per week) walk in lab in office 1-2 weeks after completing prescription.    Dispense:  12 capsule    Refill:  0

## 2020-05-06 NOTE — Telephone Encounter (Signed)
Pt advised of lab results.  He is going to pick up the prescriptions at the pharmacy.  Pt also wanted to report he is still having a lot of lower back and leg pain.  He also mentioned it feels like he strained his upper back.  He wanted to know what the next step would be (referral to ortho, pain clinic).  Please advise.   Thanks,   -Vernona Rieger

## 2020-05-07 LAB — SPECIMEN STATUS REPORT

## 2020-05-07 LAB — HEPATITIS PANEL, ACUTE
Hep A IgM: NEGATIVE
Hep B C IgM: NEGATIVE
Hep C Virus Ab: <0.1 s/co ratio (ref 0.0–0.9)
Hepatitis B Surface Ag: NEGATIVE

## 2020-05-07 LAB — VITAMIN B12: Vitamin B-12: 883 pg/mL (ref 232–1245)

## 2020-05-07 NOTE — Telephone Encounter (Signed)
lmtcb okay for PEC triage to advise .KW 

## 2020-05-08 ENCOUNTER — Encounter: Payer: Self-pay | Admitting: Adult Health

## 2020-05-08 ENCOUNTER — Telehealth (INDEPENDENT_AMBULATORY_CARE_PROVIDER_SITE_OTHER): Payer: Managed Care, Other (non HMO) | Admitting: Adult Health

## 2020-05-08 DIAGNOSIS — M5442 Lumbago with sciatica, left side: Secondary | ICD-10-CM

## 2020-05-08 DIAGNOSIS — M546 Pain in thoracic spine: Secondary | ICD-10-CM

## 2020-05-08 DIAGNOSIS — R5383 Other fatigue: Secondary | ICD-10-CM

## 2020-05-08 DIAGNOSIS — M5441 Lumbago with sciatica, right side: Secondary | ICD-10-CM

## 2020-05-08 DIAGNOSIS — G8929 Other chronic pain: Secondary | ICD-10-CM

## 2020-05-08 NOTE — Progress Notes (Addendum)
MyChart Video Visit    Virtual Visit via Video Note   This visit type was conducted due to national recommendations for restrictions regarding the COVID-19 Pandemic (e.g. social distancing) in an effort to limit this patient's exposure and mitigate transmission in our community. This patient is at least at moderate risk for complications without adequate follow up. This format is felt to be most appropriate for this patient at this time. Physical exam was limited by quality of the video and audio technology used for the visit.    Parties involved in visit as below:    Patient location: at home  Provider location: Provider: Provider's [facility]  [facility], [place]     I discussed the limitations of evaluation and management by telemedicine and the availability of in person appointments. The patient expressed understanding and agreed to proceed.  Patient: Ronald Montgomery   DOB: [birth year]   39 y.o. Male  MRN: [id number] Visit Date: 05/08/2020  Today's healthcare provider: Jairo BenMichelle Smith Cordel Drewes, FNP   No chief complaint on file.  Subjective    Back Pain This is a new problem. The current episode started more than 1 month ago. The problem has been gradually worsening since onset. The pain is present in the lumbar spine. The quality of the pain is described as aching and burning. The pain does not radiate (he does have sciatica mostly in right leg intermittent ). The pain is at a severity of 10/10. The pain is moderate. The pain is worse during the night. The symptoms are aggravated by bending, lying down and sitting. Associated symptoms include headaches and paresthesias. Pertinent negatives include no abdominal pain, bladder incontinence, bowel incontinence, chest pain, dysuria, fever, leg pain, numbness, paresis, pelvic pain, perianal numbness, tingling, weakness or weight loss. Risk factors include poor posture and obesity. He has tried analgesics and heat for  the symptoms. The treatment provided mild relief.    He rates his back pain as 18/10. He reports he thinks he " tore his muscles : in middle back   Denies any loss of bowel or bladder control.  Denies saddle paresthesias.   Denies any urinary symptoms.   Patient  denies any fever,hills, rash, chest pain, shortness of breath, nausea, vomiting, or diarrhea.  Denies dizziness, lightheadedness, pre syncopal or syncopal episodes.    Past Medical History:  Diagnosis Date  . Alcohol abuse   . Asthma   . Chlamydia   . Constipation   . Diabetes mellitus without complication (HCC)   . Difficult intubation   . Penile pain 02/03/2015   Normal exams x several   . Scabies   . Testicular/scrotal pain 02/03/2015   Normal exam and US x several.    . Thyroid disorder    during childhood   Past Surgical History:  Procedure Laterality Date  . ABCESS DRAINAGE     groin area  . CYST EXCISION     top of head  . DIRECT LARYNGOSCOPY  07/07/2015   Procedure: DIRECT LARYNGOSCOPY;  Surgeon: Linus Salmonshapman McQueen, MD;  Location: [facility] ORS;  Service: ENT;;  . INCISION AND DRAINAGE ABSCESS N/A 07/07/2015   Procedure: INCISION AND DRAINAGE ABSCESS;  Surgeon: Linus Salmonshapman McQueen, MD;  Location: [facility] ORS;  Service: ENT;  Laterality: N/A;  epiglottic abscess  . RHYTIDECTOMY NECK / CHEEK / CHIN     due to accident  . TONSILLECTOMY Bilateral 06/13/2017   Procedure: TONSILLECTOMY;  Surgeon: Linus SalmonsMcQueen, Chapman, MD;  Location: [facility] ORS;  Service:  ENT;  Laterality: Bilateral;   Social History   Tobacco Use  . Smoking status: Current Every Day Smoker    Packs/day: 0.75    Types: Cigarettes  . Smokeless tobacco: Never Used  Vaping Use  . Vaping Use: Never used  Substance Use Topics  . Alcohol use: Yes    Alcohol/week: 0.0 standard drinks  . Drug use: Not Currently    Types: Marijuana   Social History   Socioeconomic History  . Marital status: Married    Spouse name: Not on file  . Number of children: Not on file  .  Years of education: Not on file  . Highest education level: Not on file  Occupational History  . Not on file  Tobacco Use  . Smoking status: Current Every Day Smoker    Packs/day: 0.75    Types: Cigarettes  . Smokeless tobacco: Never Used  Vaping Use  . Vaping Use: Never used  Substance and Sexual Activity  . Alcohol use: Yes    Alcohol/week: 0.0 standard drinks  . Drug use: Not Currently    Types: Marijuana  . Sexual activity: Yes    Birth control/protection: None  Other Topics Concern  . Not on file  Social History Narrative  . Not on file   Social Determinants of Health   Financial Resource Strain: Not on file  Food Insecurity: Not on file  Transportation Needs: Not on file  Physical Activity: Not on file  Stress: Not on file  Social Connections: Not on file  Intimate Partner Violence: Not on file   Family Status  Relation Name Status  . Mother  Alive  . Father  Alive  . MGF  (Not Specified)   Family History  Problem Relation Age of Onset  . Skin cancer Mother   . Diabetes Mother   . Thyroid cancer Maternal Grandfather   . Heart attack Maternal Grandfather   . Diabetes Maternal Grandfather    No Known Allergies    Medications: Outpatient Medications Prior to Visit  Medication Sig  . amoxicillin-clavulanate (AUGMENTIN) 875-125 MG tablet Take 1 tablet by mouth 2 (two) times daily.  . clotrimazole (LOTRIMIN) 1 % cream Apply 1 application topically 2 (two) times daily. (Patient not taking: No sig reported)  . metFORMIN (GLUCOPHAGE) 500 MG tablet Take 1 tablet (500 mg total) by mouth 2 (two) times daily with a meal.  . predniSONE (STERAPRED UNI-PAK 21 TAB) 10 MG (21) TBPK tablet PO: Take 6 tablets on day 1:Take 5 tablets day 2:Take 4 tablets day 3: Take 3 tablets day 4:Take 2 tablets day five: 5 Take 1 tablet day 6  . Vitamin D, Ergocalciferol, (DRISDOL) 1.25 MG (50000 UNIT) CAPS capsule Take 1 capsule (50,000 Units total) by mouth every 7 (seven) days. (taking  one tablet per week) walk in lab in office 1-2 weeks after completing prescription.  . [DISCONTINUED] baclofen (LIORESAL) 10 MG tablet Take 1 tablet (10 mg total) by mouth 3 (three) times daily. Will cause drowsiness.  . [DISCONTINUED] gabapentin (NEURONTIN) 100 MG capsule Take 1 capsule (100 mg total) by mouth 2 (two) times daily.   No facility-administered medications prior to visit.    Review of Systems  Constitutional: Negative for fever and weight loss.  Cardiovascular: Negative for chest pain.  Gastrointestinal: Negative for abdominal pain and bowel incontinence.  Genitourinary: Negative for bladder incontinence, dysuria and pelvic pain.  Musculoskeletal: Positive for back pain.  Neurological: Positive for headaches and paresthesias. Negative for tingling, weakness and  numbness.    Last CBC Lab Results  Component Value Date   WBC 10.2 05/11/2020   HGB 15.5 05/11/2020   HCT 44.4 05/11/2020   MCV 92 05/11/2020   MCH 32.2 05/11/2020   RDW 12.1 05/11/2020   PLT 165 05/11/2020   Last metabolic panel Lab Results  Component Value Date   GLUCOSE 228 (H) 05/11/2020   NA 131 (L) 05/11/2020   K 3.7 05/11/2020   CL 95 (L) 05/11/2020   CO2 21 05/11/2020   BUN 13 05/11/2020   CREATININE 0.78 05/11/2020   GFRNONAA 113 05/11/2020   GFRAA 131 05/11/2020   CALCIUM 9.2 05/11/2020   PROT 7.3 05/11/2020   ALBUMIN 4.2 05/11/2020   LABGLOB 3.1 05/11/2020   AGRATIO 1.4 05/11/2020   BILITOT 0.5 05/11/2020   ALKPHOS 71 05/11/2020   AST 76 (H) 05/11/2020   ALT 160 (H) 05/11/2020   ANIONGAP 9 01/24/2020   Last lipids Lab Results  Component Value Date   CHOL 160 05/04/2020   HDL 42 05/04/2020   LDLCALC 86 05/04/2020   TRIG 186 (H) 05/04/2020   Last hemoglobin A1c Lab Results  Component Value Date   HGBA1C 8.9 (H) 05/04/2020   Last thyroid functions Lab Results  Component Value Date   TSH 2.790 05/04/2020   Last vitamin D Lab Results  Component Value Date   VD25OH 16.7  (L) 05/04/2020   Last vitamin B12 and Folate Lab Results  Component Value Date   VITAMINB12 883 05/04/2020      Objective    There were no vitals taken for this visit. BP Readings from Last 3 Encounters:  05/11/20 138/85  05/09/20 119/79  05/01/20 138/85   Wt Readings from Last 3 Encounters:  05/11/20 265 lb 12.8 oz (120.6 kg)  05/09/20 270 lb 4.5 oz (122.6 kg)  05/01/20 270 lb 3.2 oz (122.6 kg)      Physical Exam   Patient is alert and oriented and responsive to questions Engages in conversation with provider. Speaks in full sentences without any pauses without any shortness of breath or distress.     Assessment & Plan     Acute midline thoracic back pain  Fatigue, unspecified type  Chronic bilateral low back pain with bilateral sciatica  Given severity of pain he is advised to go to the emergency room and then follow up with office.  Go now do not self;f drive.  Return in about 3 days (around 05/11/2020), or if symptoms worsen or fail to improve, for at any time for any worsening symptoms, Go to Emergency room/ urgent care if worse.     I discussed the assessment and treatment plan with the patient. The patient was provided an opportunity to ask questions and all were answered. The patient agreed with the plan and demonstrated an understanding of the instructions.   The patient was advised to call back or seek an in-person evaluation if the symptoms worsen or if the condition fails to improve as anticipated.    The entirety of the information documented in the History of Present Illness, Review of Systems and Physical Exam were personally obtained by me. Portions of this information were initially documented by the CMA and reviewed by me for thoroughness and accuracy.    Addressed acute and or chronic medical problems today requiring 35  minutes reviewing patients medical record,labs, counseling patient regarding patient's conditions, any medications, answering  questions regarding health, and coordination of care as needed. After visit summary patient given  copy and reviewed sent to [facility].     Jairo Ben, FNP [facility] [phone] (phone) [phone] (fax)  [facility]

## 2020-05-08 NOTE — Telephone Encounter (Signed)
Patient advised.KW 

## 2020-05-09 ENCOUNTER — Other Ambulatory Visit: Payer: Self-pay

## 2020-05-09 ENCOUNTER — Emergency Department
Admission: EM | Admit: 2020-05-09 | Discharge: 2020-05-09 | Disposition: A | Discharge status: Home / Self Care | Payer: Managed Care, Other (non HMO) | Attending: Emergency Medicine | Admitting: Emergency Medicine

## 2020-05-09 ENCOUNTER — Encounter: Payer: Self-pay | Admitting: Emergency Medicine

## 2020-05-09 DIAGNOSIS — E119 Type 2 diabetes mellitus without complications: Secondary | ICD-10-CM | POA: Insufficient documentation

## 2020-05-09 DIAGNOSIS — Z7984 Long term (current) use of oral hypoglycemic drugs: Secondary | ICD-10-CM | POA: Diagnosis not present

## 2020-05-09 DIAGNOSIS — S3992XA Unspecified injury of lower back, initial encounter: Secondary | ICD-10-CM | POA: Diagnosis present

## 2020-05-09 DIAGNOSIS — X500XXA Overexertion from strenuous movement or load, initial encounter: Secondary | ICD-10-CM | POA: Insufficient documentation

## 2020-05-09 DIAGNOSIS — F17210 Nicotine dependence, cigarettes, uncomplicated: Secondary | ICD-10-CM | POA: Diagnosis not present

## 2020-05-09 DIAGNOSIS — S39012A Strain of muscle, fascia and tendon of lower back, initial encounter: Secondary | ICD-10-CM | POA: Diagnosis not present

## 2020-05-09 DIAGNOSIS — J45909 Unspecified asthma, uncomplicated: Secondary | ICD-10-CM | POA: Insufficient documentation

## 2020-05-09 DIAGNOSIS — M5431 Sciatica, right side: Secondary | ICD-10-CM | POA: Diagnosis not present

## 2020-05-09 MED ORDER — HYDROMORPHONE HCL 1 MG/ML IJ SOLN
1.0000 mg | Freq: Once | INTRAMUSCULAR | Status: AC
  Administered 2020-05-09: 1 mg via INTRAMUSCULAR
  Filled 2020-05-09: qty 1

## 2020-05-09 MED ORDER — CYCLOBENZAPRINE HCL 10 MG PO TABS: 10.0000 mg | ORAL_TABLET | Freq: Three times a day (TID) | ORAL | 0 refills | Status: DC | PRN

## 2020-05-09 MED ORDER — ORPHENADRINE CITRATE 30 MG/ML IJ SOLN
60.0000 mg | Freq: Two times a day (BID) | INTRAMUSCULAR | Status: DC
  Administered 2020-05-09: 60 mg via INTRAMUSCULAR
  Filled 2020-05-09: qty 2

## 2020-05-09 MED ORDER — TRAMADOL HCL 50 MG PO TABS
50.0000 mg | ORAL_TABLET | Freq: Four times a day (QID) | ORAL | 0 refills | Status: DC | PRN
Start: 2020-05-09 — End: 2020-06-03

## 2020-05-09 MED ORDER — METFORMIN HCL 500 MG PO TABS
500.0000 mg | ORAL_TABLET | Freq: Once | ORAL | Status: AC
  Administered 2020-05-09: 500 mg via ORAL
  Filled 2020-05-09: qty 1

## 2020-05-09 NOTE — Discharge Instructions (Signed)
Follow discharge care instruction take medication as directed.  Follow-up PCP for continued care.

## 2020-05-09 NOTE — ED Triage Notes (Signed)
C/O right sided back pain since Wednesday.  States Tuesday night lifted a trash can to take out to the dumpster and believes he may have injured back.  Patient has been seen and treated for lower back pain radiating to right leg.  Has taken gabapentin for that pain, but patient has not taken any medication since Thursday.  AAOx3.  Skin warm and dry. NAD

## 2020-05-09 NOTE — ED Provider Notes (Signed)
[facility] Emergency Department Provider Note   ____________________________________________   Event Date/Time   First MD Initiated Contact with Patient 05/09/20 1006     (approximate)  I have reviewed the triage vital signs and the nursing notes.   HISTORY  Chief Complaint Back Pain    HPI Ronald Montgomery is a 39 y.o. male patient presents with 3 days of low back pain secondary to lifting incident.  Patient that he was lifting a 30 gallon trash can at the end pain to a dumpster when he felt a catch in his back.  Patient also state he has radicular component to his back pain to the right lower extremity.  Patient state he has history of sciatica is taking gabapentin prescribed by his new PCP.  Patient recently finished with Medrol dose pack.  Patient denies bladder or bowel dysfunction.  Patient rates the pain as a 10/10.  Patient described pain as "sharp/achy".  No other palliative measure for complaint.         Past Medical History:  Diagnosis Date  . Alcohol abuse   . Asthma   . Chlamydia   . Constipation   . Diabetes mellitus without complication (HCC)   . Difficult intubation   . Penile pain 02/03/2015   Normal exams x several   . Scabies   . Testicular/scrotal pain 02/03/2015   Normal exam and [place] x several.    . Thyroid disorder    during childhood    Patient Active Problem List   Diagnosis Date Noted  . Acute midline thoracic back pain 05/08/2020  . Lymphocytosis 05/06/2020  . Multiple stiff joints 05/01/2020  . Diabetes mellitus without complication (HCC) 05/01/2020  . Chronic bilateral low back pain with bilateral sciatica 05/01/2020  . Screening for blood or protein in urine 05/01/2020  . Body mass index (BMI) of 40.1-44.9 in adult (HCC) 05/01/2020  . Smoker 05/01/2020  . Snoring 05/01/2020  . Poor sleep 05/01/2020  . Fatigue 05/01/2020  . Paresthesia of both lower extremities 05/01/2020  . Vitamin D deficiency 05/01/2020  .  Epiglottitis 07/07/2015  . Penile pain 02/03/2015  . Disorder of male genital organs 02/03/2015    Past Surgical History:  Procedure Laterality Date  . ABCESS DRAINAGE     groin area  . CYST EXCISION     top of head  . DIRECT LARYNGOSCOPY  07/07/2015   Procedure: DIRECT LARYNGOSCOPY;  Surgeon: Linus Salmons, MD;  Location: [facility] ORS;  Service: ENT;;  . INCISION AND DRAINAGE ABSCESS N/A 07/07/2015   Procedure: INCISION AND DRAINAGE ABSCESS;  Surgeon: Linus Salmons, MD;  Location: [facility] ORS;  Service: ENT;  Laterality: N/A;  epiglottic abscess  . RHYTIDECTOMY NECK / CHEEK / CHIN     due to accident  . TONSILLECTOMY Bilateral 06/13/2017   Procedure: TONSILLECTOMY;  Surgeon: Linus Salmons, MD;  Location: [facility] ORS;  Service: ENT;  Laterality: Bilateral;    Prior to Admission medications   Medication Sig Start Date End Date Taking? Authorizing Provider  amoxicillin-clavulanate (AUGMENTIN) 875-125 MG tablet Take 1 tablet by mouth 2 (two) times daily. 05/06/20   Flinchum, Eula Fried, FNP  clotrimazole (LOTRIMIN) 1 % cream Apply 1 application topically 2 (two) times daily. Patient not taking: Reported on 05/01/2020 01/09/20   Enid Derry, PA-C  cyclobenzaprine (FLEXERIL) 10 MG tablet Take 1 tablet (10 mg total) by mouth 3 (three) times daily as needed. 05/09/20   Joni Reining, PA-C  metFORMIN (GLUCOPHAGE) 500 MG tablet  Take 1 tablet (500 mg total) by mouth 2 (two) times daily with a meal. 05/06/20   Flinchum, Eula Fried, FNP  predniSONE (STERAPRED UNI-PAK 21 TAB) 10 MG (21) TBPK tablet PO: Take 6 tablets on day 1:Take 5 tablets day 2:Take 4 tablets day 3: Take 3 tablets day 4:Take 2 tablets day five: 5 Take 1 tablet day 6 05/01/20   Flinchum, Eula Fried, FNP  traMADol (ULTRAM) 50 MG tablet Take 1 tablet (50 mg total) by mouth every 6 (six) hours as needed for moderate pain. 05/09/20   Joni Reining, PA-C  Vitamin D, Ergocalciferol, (DRISDOL) 1.25 MG (50000 UNIT) CAPS capsule Take 1 capsule  (50,000 Units total) by mouth every 7 (seven) days. (taking one tablet per week) walk in lab in office 1-2 weeks after completing prescription. 05/06/20   Flinchum, Eula Fried, FNP    Allergies Patient has no known allergies.  Family History  Problem Relation Age of Onset  . Skin cancer Mother   . Diabetes Mother   . Thyroid cancer Maternal Grandfather   . Heart attack Maternal Grandfather   . Diabetes Maternal Grandfather     Social History Social History   Tobacco Use  . Smoking status: Current Every Day Smoker    Packs/day: 0.75    Types: Cigarettes  . Smokeless tobacco: Never Used  Vaping Use  . Vaping Use: Never used  Substance Use Topics  . Alcohol use: Yes    Alcohol/week: 0.0 standard drinks  . Drug use: Not Currently    Types: Marijuana    Review of Systems Constitutional: No fever/chills Eyes: No visual changes. ENT: No sore throat. Cardiovascular: Denies chest pain. Respiratory: Denies shortness of breath. Gastrointestinal: No abdominal pain.  No nausea, no vomiting.  No diarrhea.  No constipation. Genitourinary: Negative for dysuria. Musculoskeletal: Positive for back pain. Skin: Negative for rash. Neurological: Negative for headaches, focal weakness or numbness. Endocrine:  Diabetes and hypothyroidism.  ____________________________________________   PHYSICAL EXAM:  VITAL SIGNS: ED Triage Vitals  Enc Vitals Group     BP 05/09/20 0903 122/80     Pulse Rate 05/09/20 0903 97     Resp 05/09/20 0903 16     Temp 05/09/20 0903 98 F (36.7 C)     Temp Source 05/09/20 0903 Oral     SpO2 05/09/20 0903 99 %     Weight 05/09/20 0904 270 lb 4.5 oz (122.6 kg)     Height 05/09/20 0904 5\' 6"  (1.676 m)     Head Circumference --      Peak Flow --      Pain Score 05/09/20 0903 10     Pain Loc --      Pain Edu? --      Excl. in GC? --    Constitutional: Alert and oriented. Well appearing and in no acute distress. Cardiovascular: Normal rate, regular  rhythm. Grossly normal heart sounds.  Good peripheral circulation. Respiratory: Normal respiratory effort.  No retractions. Lungs CTAB. Gastrointestinal: Soft and nontender. No distention. No abdominal bruits. No CVA tenderness. Musculoskeletal: No spinal deformity.  Patient decreased range of motion with flexion and right lateral movements.  Patient demonstrates negative straight leg test in supine position. Neurologic:  Normal speech and language. No gross focal neurologic deficits are appreciated. No gait instability. Skin:  Skin is warm, dry and intact. No rash noted. Psychiatric: Mood and affect are normal. Speech and behavior are normal.  ____________________________________________   LABS (all labs ordered are listed, but  only abnormal results are displayed)  Labs Reviewed - No data to display ____________________________________________  EKG  Letter guarding palpation L2-L3 4. ____________________________________________  RADIOLOGY I, Joni Reining, personally viewed and evaluated these images (plain radiographs) as part of my medical decision making, as well as reviewing the written report by the radiologist.  ED MD interpretation:    Official radiology report(s): No results found.  ____________________________________________   PROCEDURES  Procedure(s) performed (including Critical Care):  Procedures   ____________________________________________   INITIAL IMPRESSION / ASSESSMENT AND PLAN / ED COURSE  As part of my medical decision making, I reviewed the following data within the electronic MEDICAL RECORD NUMBER         Patient presents with radicular back pain secondary to left accident 3 days ago.  Patient denies bladder or bowel dysfunction.  Patient complaint physical exam consistent with lumbar strain with sciatic component.  Patient given discharge care instructions.  Patient advised to take medication and follow-up PCP.  Patient given a work note.       ____________________________________________   FINAL CLINICAL IMPRESSION(S) / ED DIAGNOSES  Final diagnoses:  Strain of lumbar region, initial encounter  Sciatica of right side     ED Discharge Orders         Ordered    traMADol (ULTRAM) 50 MG tablet  Every 6 hours PRN        05/09/20 1023    cyclobenzaprine (FLEXERIL) 10 MG tablet  3 times daily PRN        05/09/20 1023          *Please note:  CAROL LOFTIN was evaluated in Emergency Department on 05/09/2020 for the symptoms described in the history of present illness. He was evaluated in the context of the global COVID-19 pandemic, which necessitated consideration that the patient might be at risk for infection with the SARS-CoV-2 virus that causes COVID-19. Institutional protocols and algorithms that pertain to the evaluation of patients at risk for COVID-19 are in a state of rapid change based on information released by regulatory bodies including the CDC and federal and state organizations. These policies and algorithms were followed during the patient's care in the ED.  Some ED evaluations and interventions may be delayed as a result of limited staffing during and the pandemic.*   Note:  This document was prepared using Dragon voice recognition software and may include unintentional dictation errors.    Joni Reining, PA-C 05/09/20 1025    Dionne Bucy, MD 05/14/20 1510

## 2020-05-11 ENCOUNTER — Encounter: Payer: Self-pay | Admitting: Adult Health

## 2020-05-11 ENCOUNTER — Ambulatory Visit
Admission: RE | Admit: 2020-05-11 | Discharge: 2020-05-11 | Disposition: A | Discharge status: Home / Self Care | Payer: Managed Care, Other (non HMO) | Source: Ambulatory Visit | Attending: Adult Health | Admitting: Adult Health

## 2020-05-11 ENCOUNTER — Ambulatory Visit
Admission: RE | Admit: 2020-05-11 | Discharge: 2020-05-11 | Disposition: A | Discharge status: Home / Self Care | Payer: Managed Care, Other (non HMO) | Attending: Adult Health | Admitting: Adult Health

## 2020-05-11 ENCOUNTER — Other Ambulatory Visit: Payer: Self-pay

## 2020-05-11 ENCOUNTER — Ambulatory Visit: Payer: Managed Care, Other (non HMO) | Admitting: Adult Health

## 2020-05-11 VITALS — BP 138/85 | HR 95 | Temp 98.4°F | Resp 16 | Wt 265.8 lb

## 2020-05-11 DIAGNOSIS — M546 Pain in thoracic spine: Secondary | ICD-10-CM | POA: Diagnosis present

## 2020-05-11 DIAGNOSIS — M542 Cervicalgia: Secondary | ICD-10-CM | POA: Diagnosis not present

## 2020-05-11 DIAGNOSIS — M545 Low back pain, unspecified: Secondary | ICD-10-CM

## 2020-05-11 NOTE — Progress Notes (Signed)
Established patient visit   Patient: Ronald Montgomery   DOB: 1980/11/02   39 y.o. Male  MRN: [id number] Visit Date: 05/11/2020  Today's healthcare provider: Jairo Ben, FNP   Chief Complaint  Patient presents with  . Back Pain   Subjective    HPI HPI    Back Pain    This is a recurrent problem.  There was an injury that may have caused the pain.  Recent episode started in the past 7 days.  The problem has been rapidly worsening since onset.  Pain is lumbar spine and thoracic spine.  The quality of pain is described as sharp and stabbing.  Severity of the pain is severe.  Pain occurs constantly.  The symptoms are aggravated by bending, position, sitting, standing, twisting and walking.  Symptoms are relieved by nothing.  Abdominal Pain: Absent.  Bowel incontinence: Absent.  Chest pain: Absent.  Dysuria: Absent.  Fever: Absent.  Headaches: Absent.  Joint pains: Absent.  Weakness in leg: Present.  Pelvic pain: Absent.  Tingling in lower extremities: Present.  Urinary incontinence: Absent.  Weight loss: Absent.       Last edited by Fonda Kinder, CMA on 05/11/2020  9:25 AM. (History)      Follow up ER visit  Patient was seen in ER for lower back pain on 05/09/20. He was treated for strain of lumbar region, sciatica of right side. Treatment for this included prescribing Tramadol 50mg  and Cyclobenzaprine 10mg . He reports fair compliance with treatment. He reports this condition is Unchanged.  He was seen in the ER, he hurt his back the day after the last visit here with me, on 05/07/2020 and picked up a 30 lb trash can he reports. He reports he was not at work. He did not have imaging or labs done at the Emergency room. He reports thoracic pain and feeling a popping when bending forward. He reports occasional sciatica in his right leg to knee. Denies any help with muscle relaxer's or prednisone, He also  denies any help from muscle relaxer or Ultram given in the  emergency room.   Denies any urinary symptoms.   Denies any loss of bowel or bladder control.  Denies saddle paresthesias.  Denies radiculopathy/ paresthesias.   Rates pain as 10/10 today in middle back and lower cervical spine.   Patient  denies any fever,,chills, rash, chest pain, shortness of breath, nausea, vomiting, or diarrhea.   Denies dizziness, lightheadedness, pre syncopal or syncopal episodes.   -----------------------------------------------------------------------------------------   Patient Active Problem List   Diagnosis Date Noted  . Cervical pain 05/11/2020  . Acute right-sided thoracic back pain 05/08/2020  . Lymphocytosis 05/06/2020  . Multiple stiff joints 05/01/2020  . Diabetes mellitus without complication (HCC) 05/01/2020  . Lumbar back pain 05/01/2020  . Screening for blood or protein in urine 05/01/2020  . Body mass index (BMI) of 40.1-44.9 in adult (HCC) 05/01/2020  . Smoker 05/01/2020  . Snoring 05/01/2020  . Poor sleep 05/01/2020  . Fatigue 05/01/2020  . Paresthesia of both lower extremities 05/01/2020  . Vitamin D deficiency 05/01/2020  . Epiglottitis 07/07/2015  . Penile pain 02/03/2015  . Disorder of male genital organs 02/03/2015   Past Medical History:  Diagnosis Date  . Alcohol abuse   . Asthma   . Chlamydia   . Constipation   . Diabetes mellitus without complication (HCC)   . Difficult intubation   . Penile pain 02/03/2015   Normal exams x  several   . Scabies   . Testicular/scrotal pain 02/03/2015   Normal exam and [place] x several.    . Thyroid disorder    during childhood   Past Surgical History:  Procedure Laterality Date  . ABCESS DRAINAGE     groin area  . CYST EXCISION     top of head  . DIRECT LARYNGOSCOPY  07/07/2015   Procedure: DIRECT LARYNGOSCOPY;  Surgeon: Linus Salmons, MD;  Location: [facility] ORS;  Service: ENT;;  . INCISION AND DRAINAGE ABSCESS N/A 07/07/2015   Procedure: INCISION AND DRAINAGE ABSCESS;  Surgeon: Linus Salmons, MD;  Location: [facility] OR[facility];  Service: ENT;  Laterality: N/A;  epiglottic abscess  . RHYTIDECTOMY NECK / CHEEK / CHIN     due to accident  . TONSILLECTOMY Bilateral 06/13/2017   Procedure: TONSILLECTOMY;  Surgeon: Linus Salmons, MD;  Location: [facility] OR[facility];  Service: ENT;  Laterality: Bilateral;   No Known Allergies     Medications: Outpatient Medications Prior to Visit  Medication Sig  . amoxicillin-clavulanate (AUGMENTIN) 875-125 MG tablet Take 1 tablet by mouth 2 (two) times daily.  . cyclobenzaprine (FLEXERIL) 10 MG tablet Take 1 tablet (10 mg total) by mouth 3 (three) times daily as needed.  . metFORMIN (GLUCOPHAGE) 500 MG tablet Take 1 tablet (500 mg total) by mouth 2 (two) times daily with a meal.  . predniSONE (STERAPRED UNI-PAK 21 TAB) 10 MG (21) TBPK tablet PO: Take 6 tablets on day 1:Take 5 tablets day 2:Take 4 tablets day 3: Take 3 tablets day 4:Take 2 tablets day five: 5 Take 1 tablet day 6  . traMADol (ULTRAM) 50 MG tablet Take 1 tablet (50 mg total) by mouth every 6 (six) hours as needed for moderate pain.  . Vitamin D, Ergocalciferol, (DRISDOL) 1.25 MG (50000 UNIT) CAPS capsule Take 1 capsule (50,000 Units total) by mouth every 7 (seven) days. (taking one tablet per week) walk in lab in office 1-2 weeks after completing prescription.  . clotrimazole (LOTRIMIN) 1 % cream Apply 1 application topically 2 (two) times daily. (Patient not taking: No sig reported)   No facility-administered medications prior to visit.    Review of Systems  Constitutional: Negative.   Respiratory: Negative.   Cardiovascular: Negative.   Gastrointestinal: Negative.   Genitourinary: Negative.   Musculoskeletal: Positive for back pain. Negative for arthralgias, gait problem, joint swelling, myalgias, neck pain and neck stiffness.  Skin: Negative.   Neurological: Negative.   Hematological: Negative.   Psychiatric/Behavioral: Negative.       Objective    BP 138/85   Pulse 95   Temp 98.4  F (36.9 C) (Oral)   Resp 16   Wt 265 lb 12.8 oz (120.6 kg)   SpO2 99%   BMI 42.90 kg/m    Physical Exam Constitutional:      General: He is not in acute distress.    Appearance: Normal appearance. He is obese. He is not ill-appearing, toxic-appearing or diaphoretic.  Cardiovascular:     Pulses: Normal pulses.     Heart sounds: Normal heart sounds.  Pulmonary:     Effort: Pulmonary effort is normal.     Breath sounds: Normal breath sounds.  Abdominal:     General: There is no distension.     Palpations: Abdomen is soft. There is no mass.     Tenderness: There is no abdominal tenderness. There is no right CVA tenderness, left CVA tenderness, guarding or rebound.     Hernia: No hernia is present.  Musculoskeletal:        General: Tenderness present. No deformity.     Cervical back: Tenderness present. No bony tenderness.     Thoracic back: Spasms and tenderness present.     Lumbar back: Spasms and tenderness present. Positive right straight leg raise test and positive left straight leg raise test.       Back:     Right lower leg: No edema.     Left lower leg: No edema.  Skin:    General: Skin is warm.  Neurological:     General: No focal deficit present.     Mental Status: He is alert.     Cranial Nerves: No cranial nerve deficit.     Sensory: No sensory deficit.     Motor: No weakness.     Coordination: Coordination normal.     Gait: Gait abnormal (walking slow due to pain in back ).     Deep Tendon Reflexes: Reflexes normal.  Psychiatric:        Mood and Affect: Mood normal.        Behavior: Behavior normal.        Thought Content: Thought content normal.        Judgment: Judgment normal.       No results found for any visits on 05/11/20.  Assessment & Plan      Lumbar back pain - Plan: Comprehensive Metabolic Panel (CMET), CBC with Differential/Platelet, Amylase, Lipase, DG Lumbar Spine Complete, DG Cervical Spine Complete, DG Thoracic Spine W/Swimmers,  CANCELED: DG Thoracic Spine 4V  Acute right-sided thoracic back pain - Plan: Comprehensive Metabolic Panel (CMET), CBC with Differential/Platelet, Amylase, Lipase, DG Lumbar Spine Complete, DG Cervical Spine Complete, DG Thoracic Spine W/Swimmers, CANCELED: DG Thoracic Spine 4V  Cervical pain  Acute midline thoracic back pain   No orders of the defined types were placed in this encounter. X rays across the street now at [facility] outpatient imaging across from our office where you went last visit. Go to [facility] today at 1 pm - walk in clinic from 1 pm to 7 pm today I advised.  No driving have someone drive you.   [facility] in [place], [place] COVID-19 info: Scientist, clinical (histocompatibility and immunogenetics).[email or web address] Get online care: [email or web address] Address: [address] [place], [place], [place] [address] Phone: [phone]  Keep follow up with [facility] regarding spine pain a and pain management.   Red Flags discussed. The patient was given clear instructions to go to ER or return to medical center if any red flags develop, symptoms do not improve, worsen or new problems develop. They verbalized understanding.   Return in about 1 week (around 05/18/2020), or if symptoms worsen or fail to improve, for at any time for any worsening symptoms, Go to Emergency room/ urgent care if worse.     The entirety of the information documented in the History of Present Illness, Review of Systems and Physical Exam were personally obtained by me. Portions of this information were initially documented by the CMA and reviewed by me for thoroughness and accuracy.      Jairo Ben, FNP  [facility] [phone] (phone) [phone] (fax)  [facility]

## 2020-05-11 NOTE — Patient Instructions (Addendum)
X rays across the street now at [facility] outpatient imaging across from our office where you went last visit. Go to [facility] today at 1 pm - walk in clinic from 1 pm to 7 pm today I advised.   [facility] in [place], [place] COVID-19 info: Scientist, clinical (histocompatibility and immunogenetics).com Get online care: [email or web address] Address: [address] [place], [place], [place] [address] Phone: [phone]    Acute Back Pain, Adult Acute back pain is sudden and usually short-lived. It is often caused by an injury to the muscles and tissues in the back. The injury may result from:  A muscle or ligament getting overstretched or torn (strained). Ligaments are tissues that connect bones to each other. Lifting something improperly can cause a back strain.  Wear and tear (degeneration) of the spinal disks. Spinal disks are circular tissue that provides cushioning between the bones of the spine (vertebrae).  Twisting motions, such as while playing sports or doing yard work.  A hit to the back.  Arthritis. You may have a physical exam, lab tests, and imaging tests to find the cause of your pain. Acute back pain usually goes away with rest and home care. Follow these instructions at home: Managing pain, stiffness, and swelling  Take over-the-counter and prescription medicines only as told by your health care provider.  Your health care provider may recommend applying ice during the first 24-48 hours after your pain starts. To do this: ? Put ice in a plastic bag. ? Place a towel between your skin and the bag. ? Leave the ice on for 20 minutes, 2-3 times a day.  If directed, apply heat to the affected area as often as told by your health care provider. Use the heat source that your health care provider recommends, such as a moist heat pack or a heating pad. ? Place a towel between your skin and the heat source. ? Leave the heat on for 20-30 minutes. ? Remove the heat if your skin turns bright  red. This is especially important if you are unable to feel pain, heat, or cold. You have a greater risk of getting burned. Activity   Do not stay in bed. Staying in bed for more than 1-2 days can delay your recovery.  Sit up and stand up straight. Avoid leaning forward when you sit, or hunching over when you stand. ? If you work at a desk, sit close to it so you do not need to lean over. Keep your chin tucked in. Keep your neck drawn back, and keep your elbows bent at a right angle. Your arms should look like the letter "L." ? Sit high and close to the steering wheel when you drive. Add lower back (lumbar) support to your car seat, if needed.  Take short walks on even surfaces as soon as you are able. Try to increase the length of time you walk each day.  Do not sit, drive, or stand in one place for more than 30 minutes at a time. Sitting or standing for long periods of time can put stress on your back.  Do not drive or use heavy machinery while taking prescription pain medicine.  Use proper lifting techniques. When you bend and lift, use positions that put less stress on your back: ? [place] your knees. ? Keep the load close to your body. ? Avoid twisting.  Exercise regularly as told by your health care provider. Exercising helps your back heal faster and helps prevent back injuries by  keeping muscles strong and flexible.  Work with a physical therapist to make a safe exercise program, as recommended by your health care provider. Do any exercises as told by your physical therapist. Lifestyle  Maintain a healthy weight. Extra weight puts stress on your back and makes it difficult to have good posture.  Avoid activities or situations that make you feel anxious or stressed. Stress and anxiety increase muscle tension and can make back pain worse. Learn ways to manage anxiety and stress, such as through exercise. General instructions  Sleep on a firm mattress in a comfortable position. Try  lying on your side with your knees slightly bent. If you lie on your back, put a pillow under your knees.  Follow your treatment plan as told by your health care provider. This may include: ? Cognitive or behavioral therapy. ? Acupuncture or massage therapy. ? Meditation or yoga. Contact a health care provider if:  You have pain that is not relieved with rest or medicine.  You have increasing pain going down into your legs or buttocks.  Your pain does not improve after 2 weeks.  You have pain at night.  You lose weight without trying.  You have a fever or chills. Get help right away if:  You develop new bowel or bladder control problems.  You have unusual weakness or numbness in your arms or legs.  You develop nausea or vomiting.  You develop abdominal pain.  You feel faint. Summary  Acute back pain is sudden and usually short-lived.  Use proper lifting techniques. When you bend and lift, use positions that put less stress on your back.  Take over-the-counter and prescription medicines and apply heat or ice as directed by your health care provider. This information is not intended to replace advice given to you by your health care provider. Make sure you discuss any questions you have with your health care provider. Document Revised: 09/04/2018 Document Reviewed: 12/28/2016 Elsevier Patient Education  2020 Elsevier Inc.  Diabetes Basics  Diabetes (diabetes mellitus) is a long-term (chronic) disease. It occurs when the body does not properly use sugar (glucose) that is released from food after you eat. Diabetes may be caused by one or both of these problems:  Your pancreas does not make enough of a hormone called insulin.  Your body does not react in a normal way to insulin that it makes. Insulin lets sugars (glucose) go into cells in your body. This gives you energy. If you have diabetes, sugars cannot get into cells. This causes high blood sugar  (hyperglycemia). Follow these instructions at home: How is diabetes treated? You may need to take insulin or other diabetes medicines daily to keep your blood sugar in balance. Take your diabetes medicines every day as told by your doctor. List your diabetes medicines here: Diabetes medicines  Name of medicine: ______________________________ ? Amount (dose): _______________ Time (a.m./p.m.): _______________ Notes: ___________________________________  Name of medicine: ______________________________ ? Amount (dose): _______________ Time (a.m./p.m.): _______________ Notes: ___________________________________  Name of medicine: ______________________________ ? Amount (dose): _______________ Time (a.m./p.m.): _______________ Notes: ___________________________________ If you use insulin, you will learn how to give yourself insulin by injection. You may need to adjust the amount based on the food that you eat. List the types of insulin you use here: Insulin  Insulin type: ______________________________ ? Amount (dose): _______________ Time (a.m./p.m.): _______________ Notes: ___________________________________  Insulin type: ______________________________ ? Amount (dose): _______________ Time (a.m./p.m.): _______________ Notes: ___________________________________  Insulin type: ______________________________ ? Amount (dose): _______________ Time (a.m./p.m.):  _______________ Notes: ___________________________________  Insulin type: ______________________________ ? Amount (dose): _______________ Time (a.m./p.m.): _______________ Notes: ___________________________________  Insulin type: ______________________________ ? Amount (dose): _______________ Time (a.m./p.m.): _______________ Notes: ___________________________________ How do I manage my blood sugar?  Check your blood sugar levels using a blood glucose monitor as directed by your doctor. Your doctor will set treatment goals for you.  Generally, you should have these blood sugar levels:  Before meals (preprandial): 80-130 mg/dL (1.3-0.84.4-7.2 mmol/L).  After meals (postprandial): below 180 mg/dL (10 mmol/L).  A1c level: less than 7%. Write down the times that you will check your blood sugar levels: Blood sugar checks  Time: _______________ Notes: ___________________________________  Time: _______________ Notes: ___________________________________  Time: _______________ Notes: ___________________________________  Time: _______________ Notes: ___________________________________  Time: _______________ Notes: ___________________________________  Time: _______________ Notes: ___________________________________  What do I need to know about low blood sugar? Low blood sugar is called hypoglycemia. This is when blood sugar is at or below 70 mg/dL (3.9 mmol/L). Symptoms may include:  Feeling: ? Hungry. ? Worried or nervous (anxious). ? Sweaty and clammy. ? Confused. ? Dizzy. ? Sleepy. ? Sick to your stomach (nauseous).  Having: ? A fast heartbeat. ? A headache. ? A change in your vision. ? Tingling or no feeling (numbness) around the mouth, lips, or tongue. ? Jerky movements that you cannot control (seizure).  Having trouble with: ? Moving (coordination). ? Sleeping. ? Passing out (fainting). ? Getting upset easily (irritability). Treating low blood sugar To treat low blood sugar, eat or drink something sugary right away. If you can think clearly and swallow safely, follow the 15:15 rule:  Take 15 grams of a fast-acting carb (carbohydrate). Talk with your doctor about how much you should take.  Some fast-acting carbs are: ? Sugar tablets (glucose pills). Take 3-4 glucose pills. ? 6-8 pieces of hard candy. ? 4-6 oz (120-150 mL) of fruit juice. ? 4-6 oz (120-150 mL) of regular (not diet) soda. ? 1 Tbsp (15 mL) honey or sugar.  Check your blood sugar 15 minutes after you take the carb.  If your blood  sugar is still at or below 70 mg/dL (3.9 mmol/L), take 15 grams of a carb again.  If your blood sugar does not go above 70 mg/dL (3.9 mmol/L) after 3 tries, get help right away.  After your blood sugar goes back to normal, eat a meal or a snack within 1 hour. Treating very low blood sugar If your blood sugar is at or below 54 mg/dL (3 mmol/L), you have very low blood sugar (severe hypoglycemia). This is an emergency. Do not wait to see if the symptoms will go away. Get medical help right away. Call your local emergency services (911 in the [place] Do not drive yourself to the hospital. Questions to ask your health care provider  Do I need to meet with a diabetes educator?  What equipment will I need to care for myself at home?  What diabetes medicines do I need? When should I take them?  How often do I need to check my blood sugar?  What number can I call if I have questions?  When is my next doctor's visit?  Where can I find a support group for people with diabetes? Where to find more information  American Diabetes Association: www.diabetes.org  American Association of Diabetes Educators: www.diabeteseducator.org/patient-resources Contact a doctor if:  Your blood sugar is at or above 240 mg/dL (65.713.3 mmol/L) for 2 days in a row.  You have been sick or  have had a fever for 2 days or more, and you are not getting better.  You have any of these problems for more than 6 hours: ? You cannot eat or drink. ? You feel sick to your stomach (nauseous). ? You throw up (vomit). ? You have watery poop (diarrhea). Get help right away if:  Your blood sugar is lower than 54 mg/dL (3 mmol/L).  You get confused.  You have trouble: ? Thinking clearly. ? Breathing. Summary  Diabetes (diabetes mellitus) is a long-term (chronic) disease. It occurs when the body does not properly use sugar (glucose) that is released from food after digestion.  Take insulin and diabetes medicines as  told.  Check your blood sugar every day, as often as told.  Keep all follow-up visits as told by your doctor. This is important. This information is not intended to replace advice given to you by your health care provider. Make sure you discuss any questions you have with your health care provider. Document Revised: 02/06/2019 Document Reviewed: 08/18/2017 Elsevier Patient Education  2020 [employer].

## 2020-05-12 ENCOUNTER — Other Ambulatory Visit: Payer: Self-pay | Admitting: Adult Health

## 2020-05-12 DIAGNOSIS — R748 Abnormal levels of other serum enzymes: Secondary | ICD-10-CM

## 2020-05-12 DIAGNOSIS — M546 Pain in thoracic spine: Secondary | ICD-10-CM

## 2020-05-12 LAB — LIPASE: Lipase: 33 U/L (ref 13–78)

## 2020-05-12 LAB — COMPREHENSIVE METABOLIC PANEL
ALT: 160 IU/L — ABNORMAL HIGH (ref 0–44)
AST: 76 IU/L — ABNORMAL HIGH (ref 0–40)
Albumin/Globulin Ratio: 1.4 (ref 1.2–2.2)
Albumin: 4.2 g/dL (ref 4.0–5.0)
Alkaline Phosphatase: 71 IU/L (ref 44–121)
BUN/Creatinine Ratio: 17 (ref 9–20)
BUN: 13 mg/dL (ref 6–24)
Bilirubin Total: 0.5 mg/dL (ref 0.0–1.2)
CO2: 21 mmol/L (ref 20–29)
Calcium: 9.2 mg/dL (ref 8.7–10.2)
Chloride: 95 mmol/L — ABNORMAL LOW (ref 96–106)
Creatinine, Ser: 0.78 mg/dL (ref 0.76–1.27)
GFR calc Af Amer: 131 mL/min/{1.73_m2} (ref >59)
GFR calc non Af Amer: 113 mL/min/{1.73_m2} (ref >59)
Globulin, Total: 3.1 g/dL (ref 1.5–4.5)
Glucose: 228 mg/dL — ABNORMAL HIGH (ref 65–99)
Potassium: 3.7 mmol/L (ref 3.5–5.2)
Sodium: 131 mmol/L — ABNORMAL LOW (ref 134–144)
Total Protein: 7.3 g/dL (ref 6.0–8.5)

## 2020-05-12 LAB — CBC WITH DIFFERENTIAL/PLATELET
Basophils Absolute: 0.1 10*3/uL (ref 0.0–0.2)
Basos: 1 %
EOS (ABSOLUTE): 0.2 10*3/uL (ref 0.0–0.4)
Eos: 2 %
Hematocrit: 44.4 % (ref 37.5–51.0)
Hemoglobin: 15.5 g/dL (ref 13.0–17.7)
Immature Grans (Abs): 0.1 10*3/uL (ref 0.0–0.1)
Immature Granulocytes: 1 %
Lymphocytes Absolute: 2.1 10*3/uL (ref 0.7–3.1)
Lymphs: 21 %
MCH: 32.2 pg (ref 26.6–33.0)
MCHC: 34.9 g/dL (ref 31.5–35.7)
MCV: 92 fL (ref 79–97)
Monocytes Absolute: 0.7 10*3/uL (ref 0.1–0.9)
Monocytes: 7 %
Neutrophils Absolute: 7 10*3/uL (ref 1.4–7.0)
Neutrophils: 68 %
Platelets: 165 10*3/uL (ref 150–450)
RBC: 4.81 x10E6/uL (ref 4.14–5.80)
RDW: 12.1 % (ref 11.6–15.4)
WBC: 10.2 10*3/uL (ref 3.4–10.8)

## 2020-05-12 LAB — AMYLASE: Amylase: 39 U/L (ref 31–110)

## 2020-05-12 NOTE — Progress Notes (Signed)
Orders Placed This Encounter  Procedures  . US Abdomen Limited RUQ (LIVER/GB)    Order Specific Question:   Reason for Exam (SYMPTOM  OR DIAGNOSIS REQUIRED)    Answer:   right sided thoracic back pain.    Order Specific Question:   Preferred imaging location?    Answer:   [facility]

## 2020-05-12 NOTE — Progress Notes (Signed)
Glucose has improved with metformin since 8 days ago. Sodium is slightly low, add electrolyte beverage or small amount of salt to diet for a few days. Liquid IV packets are good and low in glucose.  AST and ALT are elevated, please add on GGT for elevated liver enzymes.   Will order RUQ ultrasound of abdomen, will you find out what his visit entailed with [facility] yesterday ?  Amylase and lipase within normal.   CBC - white blood cells have returned to normal after antibiotic, and CBC within normal limits.

## 2020-05-13 ENCOUNTER — Ambulatory Visit
Admission: RE | Admit: 2020-05-13 | Discharge: 2020-05-13 | Disposition: A | Discharge status: Home / Self Care | Payer: Managed Care, Other (non HMO) | Source: Ambulatory Visit | Attending: Adult Health | Admitting: Adult Health

## 2020-05-13 ENCOUNTER — Telehealth: Payer: Self-pay | Admitting: *Deleted

## 2020-05-13 ENCOUNTER — Other Ambulatory Visit: Payer: Self-pay

## 2020-05-13 DIAGNOSIS — M546 Pain in thoracic spine: Secondary | ICD-10-CM | POA: Insufficient documentation

## 2020-05-13 DIAGNOSIS — R748 Abnormal levels of other serum enzymes: Secondary | ICD-10-CM | POA: Diagnosis present

## 2020-05-13 NOTE — Telephone Encounter (Signed)
Copied from CRM [id number]. Topic: General - Other >> May 12, 2020  1:48 PM Gwenlyn Fudge wrote: Reason for CRM: Pt calling and is requesting to speak with nurse regarding his lab results. Pt states that he has an [place] tomorrow morning. He states that if he does not answer the phone that someone may speak with his wife. Please advise.

## 2020-05-13 NOTE — Telephone Encounter (Signed)
See result note. KW 

## 2020-05-15 ENCOUNTER — Telehealth: Payer: Self-pay

## 2020-05-15 DIAGNOSIS — R748 Abnormal levels of other serum enzymes: Secondary | ICD-10-CM

## 2020-05-15 NOTE — Telephone Encounter (Signed)
Patient advised and has agreed. KW

## 2020-05-15 NOTE — Telephone Encounter (Signed)
-----   Message from Berniece Pap, FNP sent at 05/13/2020 11:09 AM EST ----- Fatty liver on ultrasound.  Gallbladder is mildly decompressed on CT scan no major abnormality see, this can be seen with regular digestion.  No acute abnormality seen on ultrasound.  Given his liver enzymes are elevated and appear to have been for a while, would like him to see gastrointestinal, ok to refer if he is in agreement.

## 2020-05-18 ENCOUNTER — Ambulatory Visit: Payer: Self-pay | Admitting: Adult Health

## 2020-05-18 NOTE — Patient Instructions (Signed)
Acute Back Pain, Adult Acute back pain is sudden and usually short-lived. It is often caused by an injury to the muscles and tissues in the back. The injury may result from:  A muscle or ligament getting overstretched or torn (strained). Ligaments are tissues that connect bones to each other. Lifting something improperly can cause a back strain.  Wear and tear (degeneration) of the spinal disks. Spinal disks are circular tissue that provides cushioning between the bones of the spine (vertebrae).  Twisting motions, such as while playing sports or doing yard work.  A hit to the back.  Arthritis. You may have a physical exam, lab tests, and imaging tests to find the cause of your pain. Acute back pain usually goes away with rest and home care. Follow these instructions at home: Managing pain, stiffness, and swelling  Take over-the-counter and prescription medicines only as told by your health care provider.  Your health care provider may recommend applying ice during the first 24-48 hours after your pain starts. To do this: ? Put ice in a plastic bag. ? Place a towel between your skin and the bag. ? Leave the ice on for 20 minutes, 2-3 times a day.  If directed, apply heat to the affected area as often as told by your health care provider. Use the heat source that your health care provider recommends, such as a moist heat pack or a heating pad. ? Place a towel between your skin and the heat source. ? Leave the heat on for 20-30 minutes. ? Remove the heat if your skin turns bright red. This is especially important if you are unable to feel pain, heat, or cold. You have a greater risk of getting burned. Activity   Do not stay in bed. Staying in bed for more than 1-2 days can delay your recovery.  Sit up and stand up straight. Avoid leaning forward when you sit, or hunching over when you stand. ? If you work at a desk, sit close to it so you do not need to lean over. Keep your chin tucked  in. Keep your neck drawn back, and keep your elbows bent at a right angle. Your arms should look like the letter "L." ? Sit high and close to the steering wheel when you drive. Add lower back (lumbar) support to your car seat, if needed.  Take short walks on even surfaces as soon as you are able. Try to increase the length of time you walk each day.  Do not sit, drive, or stand in one place for more than 30 minutes at a time. Sitting or standing for long periods of time can put stress on your back.  Do not drive or use heavy machinery while taking prescription pain medicine.  Use proper lifting techniques. When you bend and lift, use positions that put less stress on your back: ? Bend your knees. ? Keep the load close to your body. ? Avoid twisting.  Exercise regularly as told by your health care provider. Exercising helps your back heal faster and helps prevent back injuries by keeping muscles strong and flexible.  Work with a physical therapist to make a safe exercise program, as recommended by your health care provider. Do any exercises as told by your physical therapist. Lifestyle  Maintain a healthy weight. Extra weight puts stress on your back and makes it difficult to have good posture.  Avoid activities or situations that make you feel anxious or stressed. Stress and anxiety increase muscle   tension and can make back pain worse. Learn ways to manage anxiety and stress, such as through exercise. General instructions  Sleep on a firm mattress in a comfortable position. Try lying on your side with your knees slightly bent. If you lie on your back, put a pillow under your knees.  Follow your treatment plan as told by your health care provider. This may include: ? Cognitive or behavioral therapy. ? Acupuncture or massage therapy. ? Meditation or yoga. Contact a health care provider if:  You have pain that is not relieved with rest or medicine.  You have increasing pain going down  into your legs or buttocks.  Your pain does not improve after 2 weeks.  You have pain at night.  You lose weight without trying.  You have a fever or chills. Get help right away if:  You develop new bowel or bladder control problems.  You have unusual weakness or numbness in your arms or legs.  You develop nausea or vomiting.  You develop abdominal pain.  You feel faint. Summary  Acute back pain is sudden and usually short-lived.  Use proper lifting techniques. When you bend and lift, use positions that put less stress on your back.  Take over-the-counter and prescription medicines and apply heat or ice as directed by your health care provider. This information is not intended to replace advice given to you by your health care provider. Make sure you discuss any questions you have with your health care provider. Document Revised: 09/04/2018 Document Reviewed: 12/28/2016 Elsevier Patient Education  2020 [employer]  

## 2020-05-19 NOTE — Progress Notes (Signed)
No show

## 2020-05-20 ENCOUNTER — Ambulatory Visit (INDEPENDENT_AMBULATORY_CARE_PROVIDER_SITE_OTHER): Payer: Self-pay | Admitting: Adult Health

## 2020-05-20 DIAGNOSIS — Z91199 Patient's noncompliance with other medical treatment and regimen due to unspecified reason: Secondary | ICD-10-CM | POA: Insufficient documentation

## 2020-05-20 DIAGNOSIS — Z5329 Procedure and treatment not carried out because of patient's decision for other reasons: Secondary | ICD-10-CM

## 2020-06-03 ENCOUNTER — Other Ambulatory Visit: Payer: Self-pay

## 2020-06-03 ENCOUNTER — Ambulatory Visit: Payer: Managed Care, Other (non HMO) | Admitting: Adult Health

## 2020-06-03 ENCOUNTER — Encounter: Payer: Self-pay | Admitting: Adult Health

## 2020-06-03 VITALS — BP 120/80 | HR 86 | Temp 98.0°F | Resp 15 | Ht 66.0 in | Wt 270.0 lb

## 2020-06-03 DIAGNOSIS — Z6841 Body Mass Index (BMI) 40.0 and over, adult: Secondary | ICD-10-CM

## 2020-06-03 DIAGNOSIS — E785 Hyperlipidemia, unspecified: Secondary | ICD-10-CM

## 2020-06-03 DIAGNOSIS — E1169 Type 2 diabetes mellitus with other specified complication: Secondary | ICD-10-CM | POA: Diagnosis not present

## 2020-06-03 DIAGNOSIS — R748 Abnormal levels of other serum enzymes: Secondary | ICD-10-CM

## 2020-06-03 DIAGNOSIS — M545 Low back pain, unspecified: Secondary | ICD-10-CM

## 2020-06-03 DIAGNOSIS — E119 Type 2 diabetes mellitus without complications: Secondary | ICD-10-CM

## 2020-06-03 LAB — POCT UA - MICROALBUMIN: Microalbumin Ur, POC: 50 mg/L

## 2020-06-03 MED ORDER — BLOOD GLUCOSE METER KIT: PACK | 0 refills | Status: AC

## 2020-06-03 MED ORDER — KETOROLAC TROMETHAMINE 10 MG PO TABS
10.0000 mg | ORAL_TABLET | Freq: Three times a day (TID) | ORAL | 0 refills | Status: DC | PRN
Start: 2020-06-03 — End: 2020-10-26

## 2020-06-03 NOTE — Progress Notes (Signed)
Established patient visit   Patient: Ronald Montgomery   DOB: January 15, 1981   40 y.o. Male  MRN: [id number] Visit Date: 06/03/2020  Today's healthcare provider: Marcille Buffy, FNP   No chief complaint on file.  Subjective    HPI  Diabetes Mellitus Type II, Follow-up  Lab Results  Component Value Date   HGBA1C 8.9 (H) 05/04/2020   Wt Readings from Last 3 Encounters:  06/04/19 270 lb (122.5 kg)  05/11/20 265 lb 12.8 oz (120.6 kg)  05/09/20 270 lb 4.5 oz (122.6 kg)   Last seen for diabetes 1 months ago.  Management since then includes none. He reports good compliance with treatment. He was off of his metformin completely until restarted. On 05/06/2020. Due for ete exam.  He is not having side effects.  Symptoms: Yes fatigue No foot ulcerations  No appetite changes No nausea  Yes paresthesia of the feet  No polydipsia  No polyuria No visual disturbances   No vomiting     Home blood sugar records: not being done  Episodes of hypoglycemia? No    Current insulin regiment: none  He said [facility] and has compression fracture and he is starting physical therapy. .he was given a small supply of hydrocodone 5/325 mg PRN and Ketoralac.  Refill MgEq MgEq/Day        05/11/2020 Hydrocodone-Acetamin 5-325 Mg 20.00 5 Ale Mel Wal-Ma 0 100.00 20.00    Denies any loss of bowel or bladder control.  Denies saddle paresthesias.  Denies radiculopathy/ paresthesias.   Patient  denies any fever,chills, rash, chest pain, shortness of breath, nausea, vomiting, or diarrhea.   Pertinent Labs: Lab Results  Component Value Date   CHOL 160 05/04/2020   HDL 42 05/04/2020   LDLCALC 86 05/04/2020   TRIG 186 (H) 05/04/2020   Lab Results  Component Value Date   NA 135 06/03/2020   K 4.3 06/03/2020   CREATININE 0.85 06/03/2020   GFRNONAA 109 06/03/2020   GFRAA 126 06/03/2020   GLUCOSE 263 (H) 06/03/2020      ---------------------------------------------------------------------------------------------------   Patient Active Problem List   Diagnosis Date Noted   No-show for appointment 05/20/2020   Cervical pain 05/11/2020   Acute right-sided thoracic back pain 05/08/2020   Lymphocytosis 05/06/2020   Multiple stiff joints 05/01/2020   Diabetes mellitus without complication ([place]) 62/83/6629   Lumbar back pain 05/01/2020   Screening for blood or protein in urine 05/01/2020   Body mass index (BMI) of 40.1-44.9 in adult [facility]) 05/01/2020   Smoker 05/01/2020   Snoring 05/01/2020   Poor sleep 05/01/2020   Fatigue 05/01/2020   Paresthesia of both lower extremities 05/01/2020   Vitamin D deficiency 05/01/2020   Epiglottitis 07/07/2015   Penile pain 02/03/2015   Disorder of male genital organs 02/03/2015   Past Medical History:  Diagnosis Date   Alcohol abuse    Asthma    Chlamydia    Constipation    Diabetes mellitus without complication ([place])    Difficult intubation    Penile pain 02/03/2015   Normal exams x several    Scabies    Testicular/scrotal pain 02/03/2015   Normal exam and [place] x several.     Thyroid disorder    during childhood   Past Surgical History:  Procedure Laterality Date   ABCESS DRAINAGE     groin area   CYST EXCISION     top of head   DIRECT LARYNGOSCOPY  07/07/2015   Procedure: DIRECT LARYNGOSCOPY;  Surgeon: Beverly Gust, MD;  Location: [facility] ORS;  Service: ENT;;   INCISION AND DRAINAGE ABSCESS N/A 07/07/2015   Procedure: INCISION AND DRAINAGE ABSCESS;  Surgeon: Beverly Gust, MD;  Location: [facility] ORS;  Service: ENT;  Laterality: N/A;  epiglottic abscess   RHYTIDECTOMY NECK / CHEEK / CHIN     due to accident   TONSILLECTOMY Bilateral 06/13/2017   Procedure: TONSILLECTOMY;  Surgeon: Beverly Gust, MD;  Location: [facility] OR[facility];  Service: ENT;  Laterality: Bilateral;   Social History   Tobacco Use   Smoking status: Current  Every Day Smoker    Packs/day: 0.75    Types: Cigarettes   Smokeless tobacco: Never Used  Vaping Use   Vaping Use: Never used  Substance Use Topics   Alcohol use: Yes    Alcohol/week: 0.0 standard drinks   Drug use: Not Currently    Types: Marijuana   Social History   Socioeconomic History   Marital status: Married    Spouse name: Not on file   Number of children: Not on file   Years of education: Not on file   Highest education level: Not on file  Occupational History   Not on file  Tobacco Use   Smoking status: Current Every Day Smoker    Packs/day: 0.75    Types: Cigarettes   Smokeless tobacco: Never Used  Vaping Use   Vaping Use: Never used  Substance and Sexual Activity   Alcohol use: Yes    Alcohol/week: 0.0 standard drinks   Drug use: Not Currently    Types: Marijuana   Sexual activity: Yes    Birth control/protection: None  Other Topics Concern   Not on file  Social History Narrative   Not on file   Social Determinants of Health   Financial Resource Strain: Not on file  Food Insecurity: Not on file  Transportation Needs: Not on file  Physical Activity: Not on file  Stress: Not on file  Social Connections: Not on file  Intimate Partner Violence: Not on file   Family Status  Relation Name Status   Mother  Alive   Father  Alive   MGF  (Not Specified)   Family History  Problem Relation Age of Onset   Skin cancer Mother    Diabetes Mother    Thyroid cancer Maternal Grandfather    Heart attack Maternal Grandfather    Diabetes Maternal Grandfather    No Known Allergies     Medications: Outpatient Medications Prior to Visit  Medication Sig   metFORMIN (GLUCOPHAGE) 500 MG tablet Take 1 tablet (500 mg total) by mouth 2 (two) times daily with a meal.   clotrimazole (LOTRIMIN) 1 % cream Apply 1 application topically 2 (two) times daily. (Patient not taking: No sig reported)   cyclobenzaprine (FLEXERIL) 10 MG tablet Take  1 tablet (10 mg total) by mouth 3 (three) times daily as needed. (Patient not taking: Reported on 06/03/2020)   HYDROcodone-acetaminophen (NORCO/VICODIN) 5-325 MG tablet Take by mouth.   predniSONE (STERAPRED UNI-PAK 21 TAB) 10 MG (21) TBPK tablet PO: Take 6 tablets on day 1:Take 5 tablets day 2:Take 4 tablets day 3: Take 3 tablets day 4:Take 2 tablets day five: 5 Take 1 tablet day 6 (Patient not taking: Reported on 06/03/2020)   Vitamin D, Ergocalciferol, (DRISDOL) 1.25 MG (50000 UNIT) CAPS capsule Take 1 capsule (50,000 Units total) by mouth every 7 (seven) days. (taking one tablet per week) walk in lab in  office 1-2 weeks after completing prescription. (Patient not taking: Reported on 06/03/2020)   [DISCONTINUED] amoxicillin-clavulanate (AUGMENTIN) 875-125 MG tablet Take 1 tablet by mouth 2 (two) times daily. (Patient not taking: Reported on 06/03/2020)   [DISCONTINUED] ketorolac (TORADOL) 10 MG tablet Take 10 mg by mouth 3 (three) times daily.   [DISCONTINUED] traMADol (ULTRAM) 50 MG tablet Take 1 tablet (50 mg total) by mouth every 6 (six) hours as needed for moderate pain. (Patient not taking: Reported on 06/03/2020)   No facility-administered medications prior to visit.    Review of Systems  Constitutional: Negative.   HENT: Negative.   Respiratory: Negative.   Cardiovascular: Negative.   Gastrointestinal: Negative.   Genitourinary: Negative.   Musculoskeletal: Positive for arthralgias and back pain. Negative for gait problem, joint swelling, myalgias and neck pain.  Neurological: Negative.   Psychiatric/Behavioral: Negative.     Last CBC Lab Results  Component Value Date   WBC 9.9 06/03/2020   HGB 16.0 06/03/2020   HCT 45.2 06/03/2020   MCV 92 06/03/2020   MCH 32.4 06/03/2020   RDW 11.7 06/03/2020   PLT 182 65/99/3570   Last metabolic panel Lab Results  Component Value Date   GLUCOSE 263 (H) 06/03/2020   NA 135 06/03/2020   K 4.3 06/03/2020   CL 99 06/03/2020   CO2 20  06/03/2020   BUN 12 06/03/2020   CREATININE 0.85 06/03/2020   GFRNONAA 109 06/03/2020   GFRAA 126 06/03/2020   CALCIUM 9.4 06/03/2020   PROT 7.3 06/03/2020   ALBUMIN 4.4 06/03/2020   LABGLOB 2.9 06/03/2020   AGRATIO 1.5 06/03/2020   BILITOT 0.5 06/03/2020   ALKPHOS 74 06/03/2020   AST 70 (H) 06/03/2020   ALT 121 (H) 06/03/2020   ANIONGAP 9 01/24/2020   Last lipids Lab Results  Component Value Date   CHOL 160 05/04/2020   HDL 42 05/04/2020   LDLCALC 86 05/04/2020   TRIG 186 (H) 05/04/2020   Last hemoglobin A1c Lab Results  Component Value Date   HGBA1C 8.9 (H) 05/04/2020   Last thyroid functions Lab Results  Component Value Date   TSH 2.790 05/04/2020   Last vitamin D Lab Results  Component Value Date   VD25OH 16.7 (L) 05/04/2020   Last vitamin B12 and Folate Lab Results  Component Value Date   VITAMINB12 883 05/04/2020      Objective    BP 120/80 Comment: at 10am on 06/04/19 not 1pm.   Pulse 86    Temp 98 F (36.7 C)    Resp 15    Ht _0  (1.676 m)    Wt 270 lb (122.5 kg)    SpO2 100%    BMI 43.58 kg/m  BP Readings from Last 3 Encounters:  06/04/19 120/80  05/11/20 138/85  05/09/20 119/79   Wt Readings from Last 3 Encounters:  06/04/19 270 lb (122.5 kg)  05/11/20 265 lb 12.8 oz (120.6 kg)  05/09/20 270 lb 4.5 oz (122.6 kg)      Physical Exam Vitals and nursing note reviewed.  Constitutional:      General: He is active. He is not in acute distress.    Appearance: Normal appearance. He is well-developed and well-nourished. He is obese. He is not ill-appearing, toxic-appearing, sickly-appearing or diaphoretic.     Comments: Patient is alert and oriented and responsive to questions Engages in eye contact with provider. Speaks in full sentences without any pauses without any shortness of breath or distress.    HENT:  Head: Normocephalic and atraumatic.     Right Ear: Hearing, tympanic membrane, ear canal and external ear normal.     Left Ear:  Hearing, tympanic membrane, ear canal and external ear normal.     Nose: Nose normal.     Mouth/Throat:     Mouth: Oropharynx is clear and moist and mucous membranes are normal.     Pharynx: Uvula midline. No oropharyngeal exudate.  Eyes:     General: Lids are normal. No scleral icterus.       Right eye: No discharge.        Left eye: No discharge.     Extraocular Movements: EOM normal.     Conjunctiva/sclera: Conjunctivae normal.     Pupils: Pupils are equal, round, and reactive to light.  Neck:     Thyroid: No thyromegaly.     Vascular: Normal carotid pulses. No carotid bruit, hepatojugular reflux or JVD.     Trachea: Trachea and phonation normal. No tracheal tenderness or tracheal deviation.     Meningeal: Brudzinski's sign absent.  Cardiovascular:     Rate and Rhythm: Normal rate and regular rhythm.     Pulses: Normal pulses and intact distal pulses.     Heart sounds: Normal heart sounds, S1 normal and S2 normal. Heart sounds not distant. No murmur heard. No friction rub. No gallop.   Pulmonary:     Effort: Pulmonary effort is normal. No accessory muscle usage or respiratory distress.     Breath sounds: Normal breath sounds. No stridor. No wheezing or rales.  Chest:     Chest wall: No tenderness.  Abdominal:     General: Bowel sounds are normal. There is no distension. Aorta is normal.     Palpations: Abdomen is soft. There is no mass.     Tenderness: There is no abdominal tenderness. There is no right CVA tenderness, left CVA tenderness, guarding or rebound.     Hernia: No hernia is present.  Musculoskeletal:        General: No tenderness, deformity or edema. Normal range of motion.     Cervical back: Full passive range of motion without pain, normal range of motion and neck supple.       Back:     Comments: Patient moves on and off of exam table and in room without difficulty. Gait is normal in hall and in room. Patient is oriented to person place time and situation.  Patient answers questions appropriately and engages in conversation.   Lymphadenopathy:     Head:     Right side of head: No submental, submandibular, tonsillar, preauricular, posterior auricular or occipital adenopathy.     Left side of head: No submental, submandibular, tonsillar, preauricular, posterior auricular or occipital adenopathy.     Cervical: No cervical adenopathy.     Upper Body:  No axillary adenopathy present. Skin:    General: Skin is warm, dry and intact.     Capillary Refill: Capillary refill takes less than 2 seconds.     Coloration: Skin is not pale.     Findings: No erythema or rash.     Nails: There is no clubbing or cyanosis.  Neurological:     Mental Status: He is alert and oriented to person, place, and time.     GCS: GCS eye subscore is 4. GCS verbal subscore is 5. GCS motor subscore is 6.     Cranial Nerves: No cranial nerve deficit.     Sensory: No sensory deficit.  Motor: No abnormal muscle tone.     Coordination: He displays a negative Romberg sign. Coordination normal.     Gait: Gait normal.     Deep Tendon Reflexes: Strength normal and reflexes are normal and symmetric. Reflexes normal.  Psychiatric:        Mood and Affect: Mood and affect normal.        Speech: Speech normal.        Behavior: Behavior normal.        Thought Content: Thought content normal.        Cognition and Memory: Cognition and memory normal.        Judgment: Judgment normal.       Results for orders placed or performed in visit on 06/03/20  Comprehensive Metabolic Panel (CMET)  Result Value Ref Range   Glucose 263 (H) 65 - 99 mg/dL   BUN 12 6 - 24 mg/dL   Creatinine, Ser 0.85 0.76 - 1.27 mg/dL   GFR calc non Af Amer 109 >59 mL/min/1.73   GFR calc Af Amer 126 >59 mL/min/1.73   BUN/Creatinine Ratio 14 9 - 20   Sodium 135 134 - 144 mmol/L   Potassium 4.3 3.5 - 5.2 mmol/L   Chloride 99 96 - 106 mmol/L   CO2 20 20 - 29 mmol/L   Calcium 9.4 8.7 - 10.2 mg/dL   Total  Protein 7.3 6.0 - 8.5 g/dL   Albumin 4.4 4.0 - 5.0 g/dL   Globulin, Total 2.9 1.5 - 4.5 g/dL   Albumin/Globulin Ratio 1.5 1.2 - 2.2   Bilirubin Total 0.5 0.0 - 1.2 mg/dL   Alkaline Phosphatase 74 44 - 121 IU/L   AST 70 (H) 0 - 40 IU/L   ALT 121 (H) 0 - 44 IU/L  CBC with Differential/Platelet  Result Value Ref Range   WBC 9.9 3.4 - 10.8 x10E3/uL   RBC 4.94 4.14 - 5.80 x10E6/uL   Hemoglobin 16.0 13.0 - 17.7 g/dL   Hematocrit 45.2 37.5 - 51.0 %   MCV 92 79 - 97 fL   MCH 32.4 26.6 - 33.0 pg   MCHC 35.4 31.5 - 35.7 g/dL   RDW 11.7 11.6 - 15.4 %   Platelets 182 150 - 450 x10E3/uL   Neutrophils 71 Not Estab. %   Lymphs 18 Not Estab. %   Monocytes 6 Not Estab. %   Eos 3 Not Estab. %   Basos 1 Not Estab. %   Neutrophils Absolute 7.1 (H) 1.4 - 7.0 x10E3/uL   Lymphocytes Absolute 1.8 0.7 - 3.1 x10E3/uL   Monocytes Absolute 0.6 0.1 - 0.9 x10E3/uL   EOS (ABSOLUTE) 0.3 0.0 - 0.4 x10E3/uL   Basophils Absolute 0.1 0.0 - 0.2 x10E3/uL   Immature Granulocytes 1 Not Estab. %   Immature Grans (Abs) 0.1 0.0 - 0.1 x10E3/uL  Lipase  Result Value Ref Range   Lipase 48 13 - 78 U/L  Amylase  Result Value Ref Range   Amylase 52 31 - 110 U/L  POCT UA - Microalbumin  Result Value Ref Range   Microalbumin Ur, POC 50 mg/L   Creatinine, POC     Albumin/Creatinine Ratio, Urine, POC      Assessment & Plan    Lumbar back pain - Plan: blood glucose meter kit and supplies  Elevated liver enzymes - Plan: Comprehensive Metabolic Panel (CMET), CBC with Differential/Platelet, Lipase, Amylase  Hyperlipidemia associated with type 2 diabetes mellitus ([place]) - Plan: Comprehensive Metabolic Panel (CMET), CBC with Differential/Platelet, Amylase, POCT  UA - Microalbumin  Body mass index (BMI) of 40.1-44.9 in adult [facility]), Chronic  Diabetes mellitus, new onset ([place]), Chronic   The patient is advised to begin progressive daily aerobic exercise program, follow a low fat, low cholesterol diet, attempt to lose  weight, decrease or avoid alcohol intake, reduce exposure to stress, attend health education classes for diabetes, diet and cholesterol and exercise, continue current medications, continue current healthy lifestyle patterns and return for routine annual checkups.    Declined lifestyle/ diet and nutritional therapy he will let me know if he changes his mind.   Return in about 2 months (around 08/01/2020), or if symptoms worsen or fail to improve, for at any time for any worsening symptoms, Go to Emergency room/ urgent care if worse.    needs hemoglobin A1c at next follow up  Red Flags discussed. The patient was given clear instructions to go to ER or return to medical center if any red flags develop, symptoms do not improve, worsen or new problems develop. They verbalized understanding.  The entirety of the information documented in the History of Present Illness, Review of Systems and Physical Exam were personally obtained by me. Portions of this information were initially documented by the CMA and reviewed by me for thoroughness and accuracy.      Marcille Buffy, [place] [phone] (phone) [phone] (fax)  [place]

## 2020-06-03 NOTE — Patient Instructions (Signed)
Diabetes Mellitus and Nutrition, Adult When you have diabetes (diabetes mellitus), it is very important to have healthy eating habits because your blood sugar (glucose) levels are greatly affected by what you eat and drink. Eating healthy foods in the appropriate amounts, at about the same times every day, can help you:  Control your blood glucose.  Lower your risk of heart disease.  Improve your blood pressure.  Reach or maintain a healthy weight. Every person with diabetes is different, and each person has different needs for a meal plan. Your health care provider may recommend that you work with a diet and nutrition specialist (dietitian) to make a meal plan that is best for you. Your meal plan may vary depending on factors such as:  The calories you need.  The medicines you take.  Your weight.  Your blood glucose, blood pressure, and cholesterol levels.  Your activity level.  Other health conditions you have, such as heart or kidney disease. How do carbohydrates affect me? Carbohydrates, also called carbs, affect your blood glucose level more than any other type of food. Eating carbs naturally raises the amount of glucose in your blood. Carb counting is a method for keeping track of how many carbs you eat. Counting carbs is important to keep your blood glucose at a healthy level, especially if you use insulin or take certain oral diabetes medicines. It is important to know how many carbs you can safely have in each meal. This is different for every person. Your dietitian can help you calculate how many carbs you should have at each meal and for each snack. Foods that contain carbs include:  Bread, cereal, rice, pasta, and crackers.  Potatoes and corn.  Peas, beans, and lentils.  Milk and yogurt.  Fruit and juice.  Desserts, such as cakes, cookies, ice cream, and candy. How does alcohol affect me? Alcohol can cause a sudden decrease in blood glucose (hypoglycemia),  especially if you use insulin or take certain oral diabetes medicines. Hypoglycemia can be a life-threatening condition. Symptoms of hypoglycemia (sleepiness, dizziness, and confusion) are similar to symptoms of having too much alcohol. If your health care provider says that alcohol is safe for you, follow these guidelines:  Limit alcohol intake to no more than 1 drink per day for nonpregnant women and 2 drinks per day for men. One drink equals 12 oz of beer, 5 oz of wine, or 1 oz of hard liquor.  Do not drink on an empty stomach.  Keep yourself hydrated with water, diet soda, or unsweetened iced tea.  Keep in mind that regular soda, juice, and other mixers may contain a lot of sugar and must be counted as carbs. What are tips for following this plan?  Reading food labels  Start by checking the serving size on the "Nutrition Facts" label of packaged foods and drinks. The amount of calories, carbs, fats, and other nutrients listed on the label is based on one serving of the item. Many items contain more than one serving per package.  Check the total grams (g) of carbs in one serving. You can calculate the number of servings of carbs in one serving by dividing the total carbs by 15. For example, if a food has 30 g of total carbs, it would be equal to 2 servings of carbs.  Check the number of grams (g) of saturated and trans fats in one serving. Choose foods that have low or no amount of these fats.  Check the number of   milligrams (mg) of salt (sodium) in one serving. Most people should limit total sodium intake to less than 2,300 mg per day.  Always check the nutrition information of foods labeled as "low-fat" or "nonfat". These foods may be higher in added sugar or refined carbs and should be avoided.  Talk to your dietitian to identify your daily goals for nutrients listed on the label. Shopping  Avoid buying canned, premade, or processed foods. These foods tend to be high in fat, sodium,  and added sugar.  Shop around the outside edge of the grocery store. This includes fresh fruits and vegetables, bulk grains, fresh meats, and fresh dairy. Cooking  Use low-heat cooking methods, such as baking, instead of high-heat cooking methods like deep frying.  Cook using healthy oils, such as olive, canola, or sunflower oil.  Avoid cooking with butter, cream, or high-fat meats. Meal planning  Eat meals and snacks regularly, preferably at the same times every day. Avoid going long periods of time without eating.  Eat foods high in fiber, such as fresh fruits, vegetables, beans, and whole grains. Talk to your dietitian about how many servings of carbs you can eat at each meal.  Eat 4-6 ounces (oz) of lean protein each day, such as lean meat, chicken, fish, eggs, or tofu. One oz of lean protein is equal to: ? 1 oz of meat, chicken, or fish. ? 1 egg. ?  cup of tofu.  Eat some foods each day that contain healthy fats, such as avocado, nuts, seeds, and fish. Lifestyle  Check your blood glucose regularly.  Exercise regularly as told by your health care provider. This may include: ? 150 minutes of moderate-intensity or vigorous-intensity exercise each week. This could be brisk walking, biking, or water aerobics. ? Stretching and doing strength exercises, such as yoga or weightlifting, at least 2 times a week.  Take medicines as told by your health care provider.  Do not use any products that contain nicotine or tobacco, such as cigarettes and e-cigarettes. If you need help quitting, ask your health care provider.  Work with a Veterinary surgeon or diabetes educator to identify strategies to manage stress and any emotional and social challenges. Questions to ask a health care provider  Do I need to meet with a diabetes educator?  Do I need to meet with a dietitian?  What number can I call if I have questions?  When are the best times to check my blood glucose? Where to find more  information:  American Diabetes Association: diabetes.org  Academy of Nutrition and Dietetics: www.eatright.[employer] of Diabetes and Digestive and Kidney Diseases (NIH): CarFlippers.tn Summary  A healthy meal plan will help you control your blood glucose and maintain a healthy lifestyle.  Working with a diet and nutrition specialist (dietitian) can help you make a meal plan that is best for you.  Keep in mind that carbohydrates (carbs) and alcohol have immediate effects on your blood glucose levels. It is important to count carbs and to use alcohol carefully. This information is not intended to replace advice given to you by your health care provider. Make sure you discuss any questions you have with your health care provider. Document Revised: 04/28/2017 Document Reviewed: 06/20/2016 Elsevier Patient Education  2020 [employer]. Mediterranean Diet A Mediterranean diet refers to food and lifestyle choices that are based on the traditions of countries located on the [employer] [place]. This way of eating has been shown to help prevent certain conditions and improve outcomes  for people who have chronic diseases, like kidney disease and heart disease. What are tips for following this plan? Lifestyle  Cook and eat meals together with your family, when possible.  Drink enough fluid to keep your urine clear or pale yellow.  Be physically active every day. This includes: ? Aerobic exercise like running or swimming. ? Leisure activities like gardening, walking, or housework.  Get 7-8 hours of sleep each night.  If recommended by your health care provider, drink red wine in moderation. This means 1 glass a day for nonpregnant women and 2 glasses a day for men. A glass of wine equals 5 oz (150 mL). Reading food labels   Check the serving size of packaged foods. For foods such as rice and pasta, the serving size refers to the amount of cooked product, not dry.  Check the  total fat in packaged foods. Avoid foods that have saturated fat or trans fats.  Check the ingredients list for added sugars, such as corn syrup. Shopping  At the grocery store, buy most of your food from the areas near the walls of the store. This includes: ? Fresh fruits and vegetables (produce). ? Grains, beans, nuts, and seeds. Some of these may be available in unpackaged forms or large amounts (in bulk). ? Fresh seafood. ? Poultry and eggs. ? Low-fat dairy products.  Buy whole ingredients instead of prepackaged foods.  Buy fresh fruits and vegetables in-season from local farmers markets.  Buy frozen fruits and vegetables in resealable bags.  If you do not have access to quality fresh seafood, buy precooked frozen shrimp or canned fish, such as tuna, salmon, or sardines.  Buy small amounts of raw or cooked vegetables, salads, or olives from the deli or salad bar at your store.  Stock your pantry so you always have certain foods on hand, such as olive oil, canned tuna, canned tomatoes, rice, pasta, and beans. Cooking  Cook foods with extra-virgin olive oil instead of using butter or other vegetable oils.  Have meat as a side dish, and have vegetables or grains as your main dish. This means having meat in small portions or adding small amounts of meat to foods like pasta or stew.  Use beans or vegetables instead of meat in common dishes like chili or lasagna.  Experiment with different cooking methods. Try roasting or broiling vegetables instead of steaming or sauteing them.  Add frozen vegetables to soups, stews, pasta, or rice.  Add nuts or seeds for added healthy fat at each meal. You can add these to yogurt, salads, or vegetable dishes.  Marinate fish or vegetables using olive oil, lemon juice, garlic, and fresh herbs. Meal planning   Plan to eat 1 vegetarian meal one day each week. Try to work up to 2 vegetarian meals, if possible.  Eat seafood 2 or more times a  week.  Have healthy snacks readily available, such as: ? Vegetable sticks with hummus. ? [place] yogurt. ? Fruit and nut trail mix.  Eat balanced meals throughout the week. This includes: ? Fruit: 2-3 servings a day ? Vegetables: 4-5 servings a day ? Low-fat dairy: 2 servings a day ? Fish, poultry, or lean meat: 1 serving a day ? Beans and legumes: 2 or more servings a week ? Nuts and seeds: 1-2 servings a day ? Whole grains: 6-8 servings a day ? Extra-virgin olive oil: 3-4 servings a day  Limit red meat and sweets to only a few servings a month What are  my food choices?  Mediterranean diet ? Recommended  Grains: Whole-grain pasta. Brown rice. Bulgar wheat. Polenta. Couscous. Whole-wheat bread. [place].  Vegetables: Artichokes. Beets. Broccoli. Cabbage. Carrots. Eggplant. Green beans. Chard. Kale. Spinach. Onions. Leeks. Peas. Squash. Tomatoes. Peppers. Radishes.  Fruits: Apples. Apricots. Avocado. Berries. Bananas. Cherries. Dates. Figs. Grapes. Lemons. Melon. Oranges. Peaches. Plums. Pomegranate.  Meats and other protein foods: Beans. Almonds. Sunflower seeds. Pine nuts. Peanuts. [place]. Salmon. Scallops. Shrimp. [place]. Tilapia. Clams. Oysters. Eggs.  Dairy: Low-fat milk. Cheese. Greek yogurt.  Beverages: Water. Red wine. Herbal tea.  Fats and oils: Extra virgin olive oil. Avocado oil. Grape seed oil.  Sweets and desserts: [place] yogurt with honey. Baked apples. Poached pears. Trail mix.  Seasoning and other foods: Basil. Cilantro. Coriander. Cumin. Mint. Parsley. Sage. Rosemary. Tarragon. Garlic. Oregano. Thyme. Pepper. Balsalmic vinegar. Tahini. Hummus. Tomato sauce. Olives. Mushrooms. ? Limit these  Grains: Prepackaged pasta or rice dishes. Prepackaged cereal with added sugar.  Vegetables: Deep fried potatoes (french fries).  Fruits: Fruit canned in syrup.  Meats and other protein foods: Beef. Pork. Lamb. Poultry with skin. Hot dogs. [place] Salines.  Dairy: Ice cream.  Sour cream. Whole milk.  Beverages: Juice. Sugar-sweetened soft drinks. Beer. Liquor and spirits.  Fats and oils: Butter. Canola oil. Vegetable oil. Beef fat (tallow). Lard.  Sweets and desserts: Cookies. Cakes. Pies. Candy.  Seasoning and other foods: Mayonnaise. Premade sauces and marinades. The items listed may not be a complete list. Talk with your dietitian about what dietary choices are right for you. Summary  The Mediterranean diet includes both food and lifestyle choices.  Eat a variety of fresh fruits and vegetables, beans, nuts, seeds, and whole grains.  Limit the amount of red meat and sweets that you eat.  Talk with your health care provider about whether it is safe for you to drink red wine in moderation. This means 1 glass a day for nonpregnant women and 2 glasses a day for men. A glass of wine equals 5 oz (150 mL). This information is not intended to replace advice given to you by your health care provider. Make sure you discuss any questions you have with your health care provider. Document Revised: 01/14/2016 Document Reviewed: 01/07/2016 Elsevier Patient Education  [place]. Diabetes Basics  Diabetes (diabetes mellitus) is a long-term (chronic) disease. It occurs when the body does not properly use sugar (glucose) that is released from food after you eat. Diabetes may be caused by one or both of these problems:  Your pancreas does not make enough of a hormone called insulin.  Your body does not react in a normal way to insulin that it makes. Insulin lets sugars (glucose) go into cells in your body. This gives you energy. If you have diabetes, sugars cannot get into cells. This causes high blood sugar (hyperglycemia). Follow these instructions at home: How is diabetes treated? You may need to take insulin or other diabetes medicines daily to keep your blood sugar in balance. Take your diabetes medicines every day as told by your doctor. List your diabetes  medicines here: Diabetes medicines  Name of medicine: ______________________________ ? Amount (dose): _______________ Time (a.m./p.m.): _______________ Notes: ___________________________________  Name of medicine: ______________________________ ? Amount (dose): _______________ Time (a.m./p.m.): _______________ Notes: ___________________________________  Name of medicine: ______________________________ ? Amount (dose): _______________ Time (a.m./p.m.): _______________ Notes: ___________________________________ If you use insulin, you will learn how to give yourself insulin by injection. You may need to adjust the amount based on the food that you eat.  List the types of insulin you use here: Insulin  Insulin type: ______________________________ ? Amount (dose): _______________ Time (a.m./p.m.): _______________ Notes: ___________________________________  Insulin type: ______________________________ ? Amount (dose): _______________ Time (a.m./p.m.): _______________ Notes: ___________________________________  Insulin type: ______________________________ ? Amount (dose): _______________ Time (a.m./p.m.): _______________ Notes: ___________________________________  Insulin type: ______________________________ ? Amount (dose): _______________ Time (a.m./p.m.): _______________ Notes: ___________________________________  Insulin type: ______________________________ ? Amount (dose): _______________ Time (a.m./p.m.): _______________ Notes: ___________________________________ How do I manage my blood sugar?  Check your blood sugar levels using a blood glucose monitor as directed by your doctor. Your doctor will set treatment goals for you. Generally, you should have these blood sugar levels:  Before meals (preprandial): 80-130 mg/dL (7.9-0.2 mmol/L).  After meals (postprandial): below 180 mg/dL (10 mmol/L).  A1c level: less than 7%. Write down the times that you will check your blood sugar  levels: Blood sugar checks  Time: _______________ Notes: ___________________________________  Time: _______________ Notes: ___________________________________  Time: _______________ Notes: ___________________________________  Time: _______________ Notes: ___________________________________  Time: _______________ Notes: ___________________________________  Time: _______________ Notes: ___________________________________  What do I need to know about low blood sugar? Low blood sugar is called hypoglycemia. This is when blood sugar is at or below 70 mg/dL (3.9 mmol/L). Symptoms may include:  Feeling: ? Hungry. ? Worried or nervous (anxious). ? Sweaty and clammy. ? Confused. ? Dizzy. ? Sleepy. ? Sick to your stomach (nauseous).  Having: ? A fast heartbeat. ? A headache. ? A change in your vision. ? Tingling or no feeling (numbness) around the mouth, lips, or tongue. ? Jerky movements that you cannot control (seizure).  Having trouble with: ? Moving (coordination). ? Sleeping. ? Passing out (fainting). ? Getting upset easily (irritability). Treating low blood sugar To treat low blood sugar, eat or drink something sugary right away. If you can think clearly and swallow safely, follow the 15:15 rule:  Take 15 grams of a fast-acting carb (carbohydrate). Talk with your doctor about how much you should take.  Some fast-acting carbs are: ? Sugar tablets (glucose pills). Take 3-4 glucose pills. ? 6-8 pieces of hard candy. ? 4-6 oz (120-150 mL) of fruit juice. ? 4-6 oz (120-150 mL) of regular (not diet) soda. ? 1 Tbsp (15 mL) honey or sugar.  Check your blood sugar 15 minutes after you take the carb.  If your blood sugar is still at or below 70 mg/dL (3.9 mmol/L), take 15 grams of a carb again.  If your blood sugar does not go above 70 mg/dL (3.9 mmol/L) after 3 tries, get help right away.  After your blood sugar goes back to normal, eat a meal or a snack within 1  hour. Treating very low blood sugar If your blood sugar is at or below 54 mg/dL (3 mmol/L), you have very low blood sugar (severe hypoglycemia). This is an emergency. Do not wait to see if the symptoms will go away. Get medical help right away. Call your local emergency services (911 in the [place] Do not drive yourself to the hospital. Questions to ask your health care provider  Do I need to meet with a diabetes educator?  What equipment will I need to care for myself at home?  What diabetes medicines do I need? When should I take them?  How often do I need to check my blood sugar?  What number can I call if I have questions?  When is my next doctor's visit?  Where can I find a support group for people with diabetes?  Where to find more information  American Diabetes Association: www.diabetes.org  American Association of Diabetes Educators: www.diabeteseducator.org/patient-resources Contact a doctor if:  Your blood sugar is at or above 240 mg/dL (16.113.3 mmol/L) for 2 days in a row.  You have been sick or have had a fever for 2 days or more, and you are not getting better.  You have any of these problems for more than 6 hours: ? You cannot eat or drink. ? You feel sick to your stomach (nauseous). ? You throw up (vomit). ? You have watery poop (diarrhea). Get help right away if:  Your blood sugar is lower than 54 mg/dL (3 mmol/L).  You get confused.  You have trouble: ? Thinking clearly. ? Breathing. Summary  Diabetes (diabetes mellitus) is a long-term (chronic) disease. It occurs when the body does not properly use sugar (glucose) that is released from food after digestion.  Take insulin and diabetes medicines as told.  Check your blood sugar every day, as often as told.  Keep all follow-up visits as told by your doctor. This is important. This information is not intended to replace advice given to you by your health care provider. Make sure you discuss any questions  you have with your health care provider. Document Revised: 02/06/2019 Document Reviewed: 08/18/2017 Elsevier Patient Education  2020 [employer]. Acute Back Pain, Adult Acute back pain is sudden and usually short-lived. It is often caused by an injury to the muscles and tissues in the back. The injury may result from:  A muscle or ligament getting overstretched or torn (strained). Ligaments are tissues that connect bones to each other. Lifting something improperly can cause a back strain.  Wear and tear (degeneration) of the spinal disks. Spinal disks are circular tissue that provides cushioning between the bones of the spine (vertebrae).  Twisting motions, such as while playing sports or doing yard work.  A hit to the back.  Arthritis. You may have a physical exam, lab tests, and imaging tests to find the cause of your pain. Acute back pain usually goes away with rest and home care. Follow these instructions at home: Managing pain, stiffness, and swelling  Take over-the-counter and prescription medicines only as told by your health care provider.  Your health care provider may recommend applying ice during the first 24-48 hours after your pain starts. To do this: ? Put ice in a plastic bag. ? Place a towel between your skin and the bag. ? Leave the ice on for 20 minutes, 2-3 times a day.  If directed, apply heat to the affected area as often as told by your health care provider. Use the heat source that your health care provider recommends, such as a moist heat pack or a heating pad. ? Place a towel between your skin and the heat source. ? Leave the heat on for 20-30 minutes. ? Remove the heat if your skin turns bright red. This is especially important if you are unable to feel pain, heat, or cold. You have a greater risk of getting burned. Activity   Do not stay in bed. Staying in bed for more than 1-2 days can delay your recovery.  Sit up and stand up straight. Avoid leaning  forward when you sit, or hunching over when you stand. ? If you work at a desk, sit close to it so you do not need to lean over. Keep your chin tucked in. Keep your neck drawn back, and keep your elbows bent at a  right angle. Your arms should look like the letter "L." ? Sit high and close to the steering wheel when you drive. Add lower back (lumbar) support to your car seat, if needed.  Take short walks on even surfaces as soon as you are able. Try to increase the length of time you walk each day.  Do not sit, drive, or stand in one place for more than 30 minutes at a time. Sitting or standing for long periods of time can put stress on your back.  Do not drive or use heavy machinery while taking prescription pain medicine.  Use proper lifting techniques. When you bend and lift, use positions that put less stress on your back: ? Herman your knees. ? Keep the load close to your body. ? Avoid twisting.  Exercise regularly as told by your health care provider. Exercising helps your back heal faster and helps prevent back injuries by keeping muscles strong and flexible.  Work with a physical therapist to make a safe exercise program, as recommended by your health care provider. Do any exercises as told by your physical therapist. Lifestyle  Maintain a healthy weight. Extra weight puts stress on your back and makes it difficult to have good posture.  Avoid activities or situations that make you feel anxious or stressed. Stress and anxiety increase muscle tension and can make back pain worse. Learn ways to manage anxiety and stress, such as through exercise. General instructions  Sleep on a firm mattress in a comfortable position. Try lying on your side with your knees slightly bent. If you lie on your back, put a pillow under your knees.  Follow your treatment plan as told by your health care provider. This may include: ? Cognitive or behavioral therapy. ? Acupuncture or massage  therapy. ? Meditation or yoga. Contact a health care provider if:  You have pain that is not relieved with rest or medicine.  You have increasing pain going down into your legs or buttocks.  Your pain does not improve after 2 weeks.  You have pain at night.  You lose weight without trying.  You have a fever or chills. Get help right away if:  You develop new bowel or bladder control problems.  You have unusual weakness or numbness in your arms or legs.  You develop nausea or vomiting.  You develop abdominal pain.  You feel faint. Summary  Acute back pain is sudden and usually short-lived.  Use proper lifting techniques. When you bend and lift, use positions that put less stress on your back.  Take over-the-counter and prescription medicines and apply heat or ice as directed by your health care provider. This information is not intended to replace advice given to you by your health care provider. Make sure you discuss any questions you have with your health care provider. Document Revised: 09/04/2018 Document Reviewed: 12/28/2016 [employer]vier Patient Education  2020 [employer].

## 2020-06-04 LAB — COMPREHENSIVE METABOLIC PANEL
ALT: 121 IU/L — ABNORMAL HIGH (ref 0–44)
AST: 70 IU/L — ABNORMAL HIGH (ref 0–40)
Albumin/Globulin Ratio: 1.5 (ref 1.2–2.2)
Albumin: 4.4 g/dL (ref 4.0–5.0)
Alkaline Phosphatase: 74 IU/L (ref 44–121)
BUN/Creatinine Ratio: 14 (ref 9–20)
BUN: 12 mg/dL (ref 6–24)
Bilirubin Total: 0.5 mg/dL (ref 0.0–1.2)
CO2: 20 mmol/L (ref 20–29)
Calcium: 9.4 mg/dL (ref 8.7–10.2)
Chloride: 99 mmol/L (ref 96–106)
Creatinine, Ser: 0.85 mg/dL (ref 0.76–1.27)
GFR calc Af Amer: 126 mL/min/{1.73_m2} (ref >59)
GFR calc non Af Amer: 109 mL/min/{1.73_m2} (ref >59)
Globulin, Total: 2.9 g/dL (ref 1.5–4.5)
Glucose: 263 mg/dL — ABNORMAL HIGH (ref 65–99)
Potassium: 4.3 mmol/L (ref 3.5–5.2)
Sodium: 135 mmol/L (ref 134–144)
Total Protein: 7.3 g/dL (ref 6.0–8.5)

## 2020-06-04 LAB — LIPASE: Lipase: 48 U/L (ref 13–78)

## 2020-06-04 LAB — CBC WITH DIFFERENTIAL/PLATELET
Basophils Absolute: 0.1 x10E3/uL (ref 0.0–0.2)
Basos: 1 %
EOS (ABSOLUTE): 0.3 x10E3/uL (ref 0.0–0.4)
Eos: 3 %
Hematocrit: 45.2 % (ref 37.5–51.0)
Hemoglobin: 16 g/dL (ref 13.0–17.7)
Immature Grans (Abs): 0.1 x10E3/uL (ref 0.0–0.1)
Immature Granulocytes: 1 %
Lymphocytes Absolute: 1.8 x10E3/uL (ref 0.7–3.1)
Lymphs: 18 %
MCH: 32.4 pg (ref 26.6–33.0)
MCHC: 35.4 g/dL (ref 31.5–35.7)
MCV: 92 fL (ref 79–97)
Monocytes Absolute: 0.6 x10E3/uL (ref 0.1–0.9)
Monocytes: 6 %
Neutrophils Absolute: 7.1 x10E3/uL — ABNORMAL HIGH (ref 1.4–7.0)
Neutrophils: 71 %
Platelets: 182 x10E3/uL (ref 150–450)
RBC: 4.94 x10E6/uL (ref 4.14–5.80)
RDW: 11.7 % (ref 11.6–15.4)
WBC: 9.9 x10E3/uL (ref 3.4–10.8)

## 2020-06-04 LAB — AMYLASE: Amylase: 52 U/L (ref 31–110)

## 2020-06-04 NOTE — Progress Notes (Signed)
Glucose 263, keep on Metformin since restarted recently.  WBC's still within normal range now.  Lipase and amylase elevated.  ALT and AST liver enzymes still elevated. Avoid alcohol and any excessive tylenol use. Follow up advised, with gastroenterologist, think referral already been placed prior.

## 2020-06-05 ENCOUNTER — Other Ambulatory Visit: Payer: Self-pay | Admitting: Adult Health

## 2020-06-05 ENCOUNTER — Ambulatory Visit: Payer: Self-pay | Admitting: *Deleted

## 2020-06-05 MED ORDER — METFORMIN HCL 500 MG PO TABS: ORAL_TABLET | ORAL | 0 refills | Status: DC

## 2020-06-05 NOTE — Telephone Encounter (Addendum)
I returned the call to the number provided [phone] and his wife answered.   Ronald Montgomery is at work now and she can't reach him.   He works in a [employer] so it's hard to reach him.   I asked if he could call us during a break or lunch.   Wife said he could.  I asked her what kind of symptoms he is having and he is weak and dizzy this morning and his blood sugar is 324.  He went to work feeling this way.  I instructed her to have him call us ASAP.   She said she would try to get a message to him.   I let her know if possible he needs to call us back ASAP due to his symptoms.   She was agreeable and will try to contact him at work.  I sent this note to [facility] high priority for Marvell Fuller, FNP

## 2020-06-05 NOTE — Progress Notes (Signed)
Meds ordered this encounter  Medications  . metFORMIN (GLUCOPHAGE) 500 MG tablet    Sig: Take 2 tablets in the morning (1,000mg  ) by mouth and 1 tablet in the evening 500 mg    Dispense:  180 tablet    Refill:  0

## 2020-06-05 NOTE — Telephone Encounter (Signed)
Patient returned call do to elevated blood glucose this am. Patient reports he is at work and NT can give any information to his wife , Herbert Seta. Reports this am blood glucose was 224, he waited 15 minutes to recheck for 250, at cereal and rechecked for 324. C/o dizziness, frequent urination, blurred vision but went to work. Rechecked blood glucose after lunch for 183 at 1 pm. Decreased symptoms at this time. Patient would like to know if he should increase any oral medication or get insulin. Care advise given. Patient verbalized understanding of care advise and to call back .   Reason for Disposition . Blood glucose 70-240 mg/dL (3.9 -17.4 mmol/L)  Answer Assessment - Initial Assessment Questions 1. BLOOD GLUCOSE: "What is your blood glucose level?"      1 pm 183 2. ONSET: "When did you check the blood glucose?"      This am blood glucose was 224 he waited 15 minutes and rechecked for 250 ate cereal and rechecked for 324. Went to work did c/o blurred vision and dizzy, frequent urination 3. USUAL RANGE: "What is your glucose level usually?" (e.g., usual fasting morning value, usual evening value)     na 4. KETONES: "Do you check for ketones (urine or blood test strips)?" If yes, ask: "What does the test show now?"      na 5. TYPE 1 or 2:  "Do you know what type of diabetes you have?"  (e.g., Type 1, Type 2, Gestational; doesn't know)      na 6. INSULIN: "Do you take insulin?" "What type of insulin(s) do you use? What is the mode of delivery? (syringe, pen; injection or pump)?"      no 7. DIABETES PILLS: "Do you take any pills for your diabetes?" If yes, ask: "Have you missed taking any pills recently?"     metformin 8. OTHER SYMPTOMS: "Do you have any symptoms?" (e.g., fever, frequent urination, difficulty breathing, dizziness, weakness, vomiting)     Frequent urination, dizziness, blurred vision 9. PREGNANCY: "Is there any chance you are pregnant?" "When was your last menstrual period?"      na  Protocols used: DIABETES - HIGH BLOOD SUGAR-A-AH

## 2020-06-05 NOTE — Telephone Encounter (Signed)
Spoke with wife by phone  Meds ordered this encounter  Medications   metFORMIN (GLUCOPHAGE) 500 MG tablet    Sig: Take 2 tablets in the morning (1,000mg  ) by mouth and 1 tablet in the evening 500 mg    Dispense:  180 tablet    Refill:  0  Increased from 1,000 mg daily to as above. Monitor glucose, diet and exercise. Keep follow up and seek care immediately if worsening over weekend.

## 2020-06-05 NOTE — Telephone Encounter (Signed)
Spoke with wife by phone  Meds ordered this encounter  Medications   metFORMIN (GLUCOPHAGE) 500 MG tablet    Sig: Take 2 tablets in the morning (1,000mg ) by mouth and 1 tablet in the evening 500 mg    Dispense:  180 tablet    Refill:  0  Increased from 1,000 mg daily to as above. Monitor glucose, diet and exercise. Keep follow up and seek care immediately if worsening over weekend.  

## 2020-06-05 NOTE — Telephone Encounter (Signed)
He should drink water, watch diet and continue metformin as prescribed, Not sure if this glucose is fasting would expect not. He has not been back on his metformin long, was not taking before. Keep log of blood glucose. Monitor for other symptoms/  Seek care over the weekend if he needs to immediately.

## 2020-06-05 NOTE — Telephone Encounter (Signed)
See nurse message below

## 2020-06-05 NOTE — Telephone Encounter (Signed)
Patient wife advised.KW

## 2020-08-07 ENCOUNTER — Encounter: Payer: Self-pay | Admitting: Adult Health

## 2020-08-07 ENCOUNTER — Ambulatory Visit (INDEPENDENT_AMBULATORY_CARE_PROVIDER_SITE_OTHER): Payer: Managed Care, Other (non HMO) | Admitting: Adult Health

## 2020-08-07 DIAGNOSIS — Z5329 Procedure and treatment not carried out because of patient's decision for other reasons: Secondary | ICD-10-CM

## 2020-08-07 NOTE — Progress Notes (Signed)
NO SHOW for follow up.  

## 2020-08-13 ENCOUNTER — Encounter: Payer: Self-pay | Admitting: Adult Health

## 2020-08-14 ENCOUNTER — Encounter: Payer: Self-pay | Admitting: Adult Health

## 2020-08-14 ENCOUNTER — Other Ambulatory Visit: Payer: Self-pay

## 2020-08-14 DIAGNOSIS — Z7984 Long term (current) use of oral hypoglycemic drugs: Secondary | ICD-10-CM | POA: Diagnosis not present

## 2020-08-14 DIAGNOSIS — J45909 Unspecified asthma, uncomplicated: Secondary | ICD-10-CM | POA: Diagnosis not present

## 2020-08-14 DIAGNOSIS — E1165 Type 2 diabetes mellitus with hyperglycemia: Secondary | ICD-10-CM | POA: Diagnosis not present

## 2020-08-14 DIAGNOSIS — R739 Hyperglycemia, unspecified: Secondary | ICD-10-CM | POA: Diagnosis present

## 2020-08-14 DIAGNOSIS — F17210 Nicotine dependence, cigarettes, uncomplicated: Secondary | ICD-10-CM | POA: Diagnosis not present

## 2020-08-14 DIAGNOSIS — R519 Headache, unspecified: Secondary | ICD-10-CM | POA: Insufficient documentation

## 2020-08-14 LAB — CBC
HCT: 46.3 % (ref 39.0–52.0)
Hemoglobin: 16.1 g/dL (ref 13.0–17.0)
MCH: 32.1 pg (ref 26.0–34.0)
MCHC: 34.8 g/dL (ref 30.0–36.0)
MCV: 92.2 fL (ref 80.0–100.0)
Platelets: 172 10*3/uL (ref 150–400)
RBC: 5.02 MIL/uL (ref 4.22–5.81)
RDW: 12.3 % (ref 11.5–15.5)
WBC: 11.4 10*3/uL — ABNORMAL HIGH (ref 4.0–10.5)
nRBC: 0 % (ref 0.0–0.2)

## 2020-08-14 LAB — CBG MONITORING, ED: Glucose-Capillary: 130 mg/dL — ABNORMAL HIGH (ref 70–99)

## 2020-08-14 NOTE — ED Triage Notes (Signed)
Pt states coming in with a high blood sugar. Pt states for the last 3 months he has not had a blood sugar less than 200. Pt states he is on metformin and wants to be on insulin. Pt states headache.

## 2020-08-15 ENCOUNTER — Emergency Department
Admission: EM | Admit: 2020-08-15 | Discharge: 2020-08-15 | Disposition: A | Discharge status: Home / Self Care | Payer: Managed Care, Other (non HMO) | Attending: Emergency Medicine | Admitting: Emergency Medicine

## 2020-08-15 DIAGNOSIS — R739 Hyperglycemia, unspecified: Secondary | ICD-10-CM

## 2020-08-15 LAB — URINALYSIS, COMPLETE (UACMP) WITH MICROSCOPIC
Bacteria, UA: NONE SEEN
Bilirubin Urine: NEGATIVE
Glucose, UA: NEGATIVE mg/dL
Hgb urine dipstick: NEGATIVE
Ketones, ur: NEGATIVE mg/dL
Leukocytes,Ua: NEGATIVE
Nitrite: NEGATIVE
Protein, ur: NEGATIVE mg/dL
Specific Gravity, Urine: 1.033 — ABNORMAL HIGH (ref 1.005–1.030)
pH: 5 (ref 5.0–8.0)

## 2020-08-15 LAB — BASIC METABOLIC PANEL
Anion gap: 10 (ref 5–15)
BUN: 15 mg/dL (ref 6–20)
CO2: 21 mmol/L — ABNORMAL LOW (ref 22–32)
Calcium: 9.3 mg/dL (ref 8.9–10.3)
Chloride: 103 mmol/L (ref 98–111)
Creatinine, Ser: 0.83 mg/dL (ref 0.61–1.24)
GFR, Estimated: >60 mL/min (ref >60)
Glucose, Bld: 126 mg/dL — ABNORMAL HIGH (ref 70–99)
Potassium: 3.7 mmol/L (ref 3.5–5.1)
Sodium: 134 mmol/L — ABNORMAL LOW (ref 135–145)

## 2020-08-15 NOTE — ED Provider Notes (Signed)
[facility] Emergency Department Provider Note   ____________________________________________    I have reviewed the triage vital signs and the nursing notes.   HISTORY  Chief Complaint Hyperglycemia     HPI Ronald Montgomery is a 40 y.o. male with history as noted below who presents with complaints of elevated blood glucose.  Patient reports a history of type 2 diabetes, he does take Metformin and is compliant.  He reports over the last week he feels that his sugar is frequently been over 200.  He has had intermittent mild headaches and occasionally felt dehydrated.  No nausea or vomiting.  Does have an appointment with his PCP in 4 days  Past Medical History:  Diagnosis Date  . Alcohol abuse   . Asthma   . Chlamydia   . Constipation   . Diabetes mellitus without complication ([place])   . Difficult intubation   . Penile pain 02/03/2015   Normal exams x several   . Scabies   . Testicular/scrotal pain 02/03/2015   Normal exam and [place] x several.    . Thyroid disorder    during childhood    Patient Active Problem List   Diagnosis Date Noted  . No-show for appointment 05/20/2020  . Cervical pain 05/11/2020  . Acute right-sided thoracic back pain 05/08/2020  . Lymphocytosis 05/06/2020  . Multiple stiff joints 05/01/2020  . Diabetes mellitus without complication ([place]) 03/88/8280  . Lumbar back pain 05/01/2020  . Screening for blood or protein in urine 05/01/2020  . Body mass index (BMI) of 40.1-44.9 in adult ([place]) 05/01/2020  . Smoker 05/01/2020  . Snoring 05/01/2020  . Poor sleep 05/01/2020  . Fatigue 05/01/2020  . Paresthesia of both lower extremities 05/01/2020  . Vitamin D deficiency 05/01/2020  . Epiglottitis 07/07/2015  . Penile pain 02/03/2015  . Disorder of male genital organs 02/03/2015    Past Surgical History:  Procedure Laterality Date  . ABCESS DRAINAGE     groin area  . CYST EXCISION     top of head  . DIRECT LARYNGOSCOPY   07/07/2015   Procedure: DIRECT LARYNGOSCOPY;  Surgeon: Beverly Gust, MD;  Location: [facility] ORS;  Service: ENT;;  . INCISION AND DRAINAGE ABSCESS N/A 07/07/2015   Procedure: INCISION AND DRAINAGE ABSCESS;  Surgeon: Beverly Gust, MD;  Location: [facility] ORS;  Service: ENT;  Laterality: N/A;  epiglottic abscess  . RHYTIDECTOMY NECK / CHEEK / CHIN     due to accident  . TONSILLECTOMY Bilateral 06/13/2017   Procedure: TONSILLECTOMY;  Surgeon: Beverly Gust, MD;  Location: [facility] ORS;  Service: ENT;  Laterality: Bilateral;    Prior to Admission medications   Medication Sig Start Date End Date Taking? Authorizing Provider  blood glucose meter kit and supplies Dispense based on patient and insurance preference. Use up to four times daily as directed. (FOR ICD-10 E10.9, E11.9). 06/03/20   Flinchum, Kelby Aline, FNP  clotrimazole (LOTRIMIN) 1 % cream Apply 1 application topically 2 (two) times daily. Patient not taking: No sig reported 01/09/20   Laban Emperor, PA-C  cyclobenzaprine (FLEXERIL) 10 MG tablet Take 1 tablet (10 mg total) by mouth 3 (three) times daily as needed. Patient not taking: Reported on 06/03/2020 05/09/20   Sable Feil, PA-C  HYDROcodone-acetaminophen (NORCO/VICODIN) 5-325 MG tablet Take by mouth. 05/11/20   [provider]  ketorolac (TORADOL) 10 MG tablet Take 1 tablet (10 mg total) by mouth every 8 (eight) hours as needed. 06/03/20   Flinchum, Kelby Aline,  FNP  metFORMIN (GLUCOPHAGE) 500 MG tablet Take 2 tablets in the morning (1,043m ) by mouth and 1 tablet in the evening 500 mg 06/05/20   Flinchum, MKelby Aline FNP  Vitamin D, Ergocalciferol, (DRISDOL) 1.25 MG (50000 UNIT) CAPS capsule Take 1 capsule (50,000 Units total) by mouth every 7 (seven) days. (taking one tablet per week) walk in lab in office 1-2 weeks after completing prescription. Patient not taking: Reported on 06/03/2020 05/06/20   Flinchum, MKelby Aline FNP     Allergies Patient has no known allergies.  Family  History  Problem Relation Age of Onset  . Skin cancer Mother   . Diabetes Mother   . Thyroid cancer Maternal Grandfather   . Heart attack Maternal Grandfather   . Diabetes Maternal Grandfather     Social History Social History   Tobacco Use  . Smoking status: Current Every Day Smoker    Packs/day: 0.75    Types: Cigarettes  . Smokeless tobacco: Never Used  Vaping Use  . Vaping Use: Never used  Substance Use Topics  . Alcohol use: Yes    Alcohol/week: 0.0 standard drinks  . Drug use: Not Currently    Types: Marijuana    Review of Systems  Constitutional: No fever/chills Eyes: No visual changes.  ENT: No sore throat. Cardiovascular: Denies chest pain. Respiratory: Denies shortness of breath. Gastrointestinal: No abdominal pain.  No nausea, no vomiting.   Genitourinary: Negative for dysuria. Musculoskeletal: Negative for back pain. Skin: Negative for rash. Neurological: As above   ____________________________________________   PHYSICAL EXAM:  VITAL SIGNS: ED Triage Vitals  Enc Vitals Group     BP 08/14/20 2336 (!) 145/102     Pulse Rate 08/14/20 2336 91     Resp 08/14/20 2336 18     Temp 08/14/20 2336 98.3 F (36.8 C)     Temp Source 08/14/20 2336 Oral     SpO2 08/14/20 2336 98 %     Weight 08/14/20 2337 118.8 kg (262 lb)     Height 08/14/20 2337 1.676 m (_0 )     Head Circumference --      Peak Flow --      Pain Score 08/14/20 2336 7     Pain Loc --      Pain Edu? --      [place]cl. in [place] --     Constitutional: Alert and oriented. No acute distress. Pleasant and interactive Eyes: Conjunctivae are normal.  Head: Atraumatic.  Cardiovascular:   Good peripheral circulation. Respiratory: Normal respiratory effort.  No retractions Gastrointestinal: Soft and nontender. No distention.   Musculoskeletal:  Warm and well perfused Neurologic:  Normal speech and language. No gross focal neurologic deficits are appreciated.  Skin:  Skin is warm, dry and  intact. No rash noted. Psychiatric: Mood and affect are normal. Speech and behavior are normal.  ____________________________________________   LABS (all labs ordered are listed, but only abnormal results are displayed)  Labs Reviewed  BASIC METABOLIC PANEL - Abnormal; Notable for the following components:      Result Value   Sodium 134 (*)    CO2 21 (*)    Glucose, Bld 126 (*)    All other components within normal limits  CBC - Abnormal; Notable for the following components:   WBC 11.4 (*)    All other components within normal limits  URINALYSIS, COMPLETE (UACMP) WITH MICROSCOPIC - Abnormal; Notable for the following components:   Color, Urine YELLOW (*)    APPearance CLEAR (*)  Specific Gravity, Urine 1.033 (*)    All other components within normal limits  CBG MONITORING, ED - Abnormal; Notable for the following components:   Glucose-Capillary 130 (*)    All other components within normal limits  CBG MONITORING, ED   ____________________________________________  EKG  None ____________________________________________  RADIOLOGY  None ____________________________________________   PROCEDURES  Procedure(s) performed: No  Procedures   Critical Care performed: No ____________________________________________   INITIAL IMPRESSION / ASSESSMENT AND PLAN / ED COURSE  Pertinent labs & imaging results that were available during my care of the patient were reviewed by me and considered in my medical decision making (see chart for details).  Patient presents with complaints of hypoglycemia however her glucose here is quite reassuring,, on chemistry labs glucose of 126, normal urinalysis  Patient reassured by this, recommend follow-up with PCP for medication adjustment as needed, hemoglobin A1c    ____________________________________________   FINAL CLINICAL IMPRESSION(S) / ED DIAGNOSES  Final diagnoses:  Hyperglycemia        Note:  This document was  prepared using Dragon voice recognition software and may include unintentional dictation errors.   Lavonia Drafts, MD 08/15/20 [phone]

## 2020-08-15 NOTE — Discharge Instructions (Addendum)
Your sugar was normal here

## 2020-08-19 ENCOUNTER — Telehealth (INDEPENDENT_AMBULATORY_CARE_PROVIDER_SITE_OTHER): Payer: Managed Care, Other (non HMO) | Admitting: Adult Health

## 2020-08-19 DIAGNOSIS — Z5329 Procedure and treatment not carried out because of patient's decision for other reasons: Secondary | ICD-10-CM

## 2020-08-19 NOTE — Progress Notes (Signed)
    MyChart Video Visit    Virtual Visit via Video Note  No show did not answer  For video visit and did not get online.

## 2020-08-26 ENCOUNTER — Ambulatory Visit: Payer: Managed Care, Other (non HMO) | Admitting: Urology

## 2020-09-04 ENCOUNTER — Ambulatory Visit: Payer: Managed Care, Other (non HMO) | Admitting: Urology

## 2020-10-01 ENCOUNTER — Other Ambulatory Visit: Payer: Self-pay | Admitting: Adult Health

## 2020-10-01 MED ORDER — METFORMIN HCL 500 MG PO TABS: ORAL_TABLET | ORAL | 0 refills | Status: DC

## 2020-10-18 ENCOUNTER — Emergency Department
Admission: EM | Admit: 2020-10-18 | Discharge: 2020-10-19 | Disposition: A | Discharge status: Home / Self Care | Payer: Managed Care, Other (non HMO) | Attending: Emergency Medicine | Admitting: Emergency Medicine

## 2020-10-18 ENCOUNTER — Other Ambulatory Visit: Payer: Self-pay

## 2020-10-18 DIAGNOSIS — J45909 Unspecified asthma, uncomplicated: Secondary | ICD-10-CM | POA: Insufficient documentation

## 2020-10-18 DIAGNOSIS — J019 Acute sinusitis, unspecified: Secondary | ICD-10-CM | POA: Diagnosis not present

## 2020-10-18 DIAGNOSIS — R590 Localized enlarged lymph nodes: Secondary | ICD-10-CM | POA: Insufficient documentation

## 2020-10-18 DIAGNOSIS — E119 Type 2 diabetes mellitus without complications: Secondary | ICD-10-CM | POA: Diagnosis not present

## 2020-10-18 DIAGNOSIS — Z7984 Long term (current) use of oral hypoglycemic drugs: Secondary | ICD-10-CM | POA: Diagnosis not present

## 2020-10-18 DIAGNOSIS — Z20822 Contact with and (suspected) exposure to covid-19: Secondary | ICD-10-CM | POA: Insufficient documentation

## 2020-10-18 DIAGNOSIS — F17210 Nicotine dependence, cigarettes, uncomplicated: Secondary | ICD-10-CM | POA: Diagnosis not present

## 2020-10-18 DIAGNOSIS — E039 Hypothyroidism, unspecified: Secondary | ICD-10-CM | POA: Diagnosis not present

## 2020-10-18 DIAGNOSIS — M542 Cervicalgia: Secondary | ICD-10-CM | POA: Diagnosis present

## 2020-10-18 NOTE — ED Triage Notes (Signed)
Pt complains of left sided lymph node swelling, left nare congestion and left ear pain.

## 2020-10-19 DIAGNOSIS — R590 Localized enlarged lymph nodes: Secondary | ICD-10-CM | POA: Diagnosis not present

## 2020-10-19 LAB — RESP PANEL BY RT-PCR (FLU A&B, COVID) ARPGX2
Influenza A by PCR: NEGATIVE
Influenza B by PCR: NEGATIVE
SARS Coronavirus 2 by RT PCR: NEGATIVE

## 2020-10-19 MED ORDER — IBUPROFEN 800 MG PO TABS
800.0000 mg | ORAL_TABLET | Freq: Once | ORAL | Status: AC
  Administered 2020-10-19: 800 mg via ORAL
  Filled 2020-10-19: qty 1

## 2020-10-19 NOTE — Discharge Instructions (Signed)
You may alternate Tylenol 1000 mg every 6 hours as needed for pain, fever and Ibuprofen 800 mg every 8 hours as needed for pain, fever.  Please take Ibuprofen with food.  Do not take more than 4000 mg of Tylenol (acetaminophen) in a 24 hour period.  

## 2020-10-19 NOTE — ED Provider Notes (Signed)
[facility] Emergency Department Provider Note  ____________________________________________   Event Date/Time   First MD Initiated Contact with Patient 10/18/20 2340     (approximate)  I have reviewed the triage vital signs and the nursing notes.   HISTORY  Chief Complaint Adenopathy    HPI Ronald Montgomery is a 40 y.o. male with history of epiglottitis status post tonsillectomy, diabetes, asthma, alcohol abuse who presents to the emergency department with complaints of 2 days of cervical lymphadenopathy worse on the left side with neck pain worse with turning his head right and left.  States he has had some nasal congestion, sinus pressure.  Unsure if he has had any fevers.  Denies chills.  Denies having a sore throat.  No vomiting or diarrhea.  States he is also had some left ear pain but this is improving.  He is not vaccinated against COVID-19 or influenza.        Past Medical History:  Diagnosis Date  . Alcohol abuse   . Asthma   . Chlamydia   . Constipation   . Diabetes mellitus without complication ([place])   . Difficult intubation   . Penile pain 02/03/2015   Normal exams x several   . Scabies   . Testicular/scrotal pain 02/03/2015   Normal exam and [place] x several.    . Thyroid disorder    during childhood    Patient Active Problem List   Diagnosis Date Noted  . No-show for appointment 05/20/2020  . Cervical pain 05/11/2020  . Acute right-sided thoracic back pain 05/08/2020  . Lymphocytosis 05/06/2020  . Multiple stiff joints 05/01/2020  . Diabetes mellitus without complication ([place]) 62/94/7654  . Lumbar back pain 05/01/2020  . Screening for blood or protein in urine 05/01/2020  . Body mass index (BMI) of 40.1-44.9 in adult ([place]) 05/01/2020  . Smoker 05/01/2020  . Snoring 05/01/2020  . Poor sleep 05/01/2020  . Fatigue 05/01/2020  . Paresthesia of both lower extremities 05/01/2020  . Vitamin D deficiency 05/01/2020  . Epiglottitis  07/07/2015  . Penile pain 02/03/2015  . Disorder of male genital organs 02/03/2015    Past Surgical History:  Procedure Laterality Date  . ABCESS DRAINAGE     groin area  . CYST EXCISION     top of head  . DIRECT LARYNGOSCOPY  07/07/2015   Procedure: DIRECT LARYNGOSCOPY;  Surgeon: Beverly Gust, MD;  Location: [facility] ORS;  Service: ENT;;  . INCISION AND DRAINAGE ABSCESS N/A 07/07/2015   Procedure: INCISION AND DRAINAGE ABSCESS;  Surgeon: Beverly Gust, MD;  Location: [facility] ORS;  Service: ENT;  Laterality: N/A;  epiglottic abscess  . RHYTIDECTOMY NECK / CHEEK / CHIN     due to accident  . TONSILLECTOMY Bilateral 06/13/2017   Procedure: TONSILLECTOMY;  Surgeon: Beverly Gust, MD;  Location: [facility] ORS;  Service: ENT;  Laterality: Bilateral;    Prior to Admission medications   Medication Sig Start Date End Date Taking? Authorizing Provider  blood glucose meter kit and supplies Dispense based on patient and insurance preference. Use up to four times daily as directed. (FOR ICD-10 E10.9, E11.9). 06/03/20   Flinchum, Kelby Aline, FNP  clotrimazole (LOTRIMIN) 1 % cream Apply 1 application topically 2 (two) times daily. Patient not taking: No sig reported 01/09/20   Laban Emperor, PA-C  cyclobenzaprine (FLEXERIL) 10 MG tablet Take 1 tablet (10 mg total) by mouth 3 (three) times daily as needed. Patient not taking: Reported on 06/03/2020 05/09/20   Mardee Postin  K, PA-C  HYDROcodone-acetaminophen (NORCO/VICODIN) 5-325 MG tablet Take by mouth. 05/11/20   [provider]  ketorolac (TORADOL) 10 MG tablet Take 1 tablet (10 mg total) by mouth every 8 (eight) hours as needed. 06/03/20   Flinchum, Kelby Aline, FNP  metFORMIN (GLUCOPHAGE) 500 MG tablet Take 2 tablets in the morning (1,$RemoveBefore'000mg'RecjSRRGovjQA$  ) by mouth and 1 tablet in the evening 500 mg Please schedule an office visit before anymore refills. 10/01/20   Chrismon, Vickki Muff, PA-C  Vitamin D, Ergocalciferol, (DRISDOL) 1.25 MG (50000 UNIT) CAPS capsule Take 1  capsule (50,000 Units total) by mouth every 7 (seven) days. (taking one tablet per week) walk in lab in office 1-2 weeks after completing prescription. Patient not taking: Reported on 06/03/2020 05/06/20   Flinchum, Kelby Aline, FNP    Allergies Patient has no known allergies.  Family History  Problem Relation Age of Onset  . Skin cancer Mother   . Diabetes Mother   . Thyroid cancer Maternal Grandfather   . Heart attack Maternal Grandfather   . Diabetes Maternal Grandfather     Social History Social History   Tobacco Use  . Smoking status: Current Every Day Smoker    Packs/day: 0.75    Types: Cigarettes  . Smokeless tobacco: Never Used  Vaping Use  . Vaping Use: Never used  Substance Use Topics  . Alcohol use: Yes    Alcohol/week: 0.0 standard drinks  . Drug use: Not Currently    Types: Marijuana    Review of Systems Constitutional: No fever. Eyes: No visual changes. ENT: No sore throat. Cardiovascular: Denies chest pain. Respiratory: Denies shortness of breath. Gastrointestinal: No nausea, vomiting, diarrhea. Genitourinary: Negative for dysuria. Musculoskeletal: Negative for back pain. Skin: Negative for rash. Neurological: Negative for focal weakness or numbness.  ____________________________________________   PHYSICAL EXAM:  VITAL SIGNS: ED Triage Vitals  Enc Vitals Group     BP 10/18/20 2120 136/86     Pulse Rate 10/18/20 2120 (!) 104     Resp 10/18/20 2120 16     Temp 10/18/20 2120 98.4 F (36.9 C)     Temp Source 10/18/20 2120 Oral     SpO2 10/18/20 2120 100 %     Weight 10/18/20 2121 260 lb (117.9 kg)     Height 10/18/20 2121 $RemoveBefor'5\' 6"'[id number]$  (1.676 m)     Head Circumference --      Peak Flow --      Pain Score 10/18/20 2121 2     Pain Loc --      Pain Edu? --      Excl. in [place]? --    CONSTITUTIONAL: Alert and oriented and responds appropriately to questions.  Chronically ill-appearing, obese, in no distress HEAD: Normocephalic EYES: Conjunctivae clear,  pupils appear equal, EOM appear intact ENT: normal nose; moist mucous membranes; TMs are clear bilaterally without erythema, purulence, bulging, perforation, effusion.  No cerumen impaction or sign of foreign body in the external auditory canal. No inflammation, erythema or drainage from the external auditory canal. No signs of mastoiditis. No pain with manipulation of the pinna bilaterally.  No pharyngeal erythema or petechiae, patient is status post tonsillectomy, no uvular deviation, no unilateral swelling, no trismus or drooling, no muffled voice, normal phonation, no stridor, no drainable dental abscess noted, no Ludwig's angina, tongue sits flat in the bottom of the mouth, no angioedema, no facial erythema or warmth, no facial swelling NECK: Supple, no meningismus, full range of motion of the neck, bilateral anterior cervical  lymphadenopathy appreciated without redness or warmth, no thyromegaly, trachea midline, no midline spinal tenderness or step-off or deformity CARD: RRR; S1 and S2 appreciated; no murmurs, no clicks, no rubs, no gallops RESP: Normal chest excursion without splinting or tachypnea; breath sounds clear and equal bilaterally; no wheezes, no rhonchi, no rales, no hypoxia or respiratory distress, speaking full sentences ABD/GI: Normal bowel sounds; non-distended; soft, non-tender, no rebound, no guarding, no peritoneal signs, no hepatosplenomegaly BACK: The back appears normal EXT: Normal ROM in all joints; no deformity noted, no edema; no cyanosis SKIN: Normal color for age and race; warm; no rash on exposed skin NEURO: Moves all extremities equally PSYCH: The patient's mood and manner are appropriate.  ____________________________________________   LABS (all labs ordered are listed, but only abnormal results are displayed)  Labs Reviewed  RESP PANEL BY RT-PCR (FLU A&B, COVID) [id number]    ____________________________________________  EKG   ____________________________________________  RADIOLOGY I, Bearl Talarico, personally viewed and evaluated these images (plain radiographs) as part of my medical decision making, as well as reviewing the written report by the radiologist.  ED MD interpretation:    Official radiology report(s): No results found.  ____________________________________________   PROCEDURES  Procedure(s) performed (including Critical Care):  Procedures    ____________________________________________   INITIAL IMPRESSION / ASSESSMENT AND PLAN / ED COURSE  As part of my medical decision making, I reviewed the following data within the [facility] notes reviewed and incorporated, Old chart reviewed and Notes from prior ED visits         Patient here with likely viral illness, sinusitis causing lymphadenopathy.  He is well-appearing here, nontoxic.  He denies any sore throat to me despite nursing notes.  He states it is more pain on the outside of his neck from the swollen lymph nodes.  He is not vaccinated against COVID-19 or influenza.  He agrees to testing today.  He has had previous tonsillectomy which makes strep pharyngitis much less likely especially given he has not complaining of actual sore throat.  Posterior oropharynx appears normal today.  No signs of deep space neck infection, uvulitis.  Doubt meningitis.  No thyromegaly on exam.  Recommended alternating Tylenol, Motrin for pain.  I do not feel he needs to be on antibiotics.  Patient will follow-up on his COVID test through [facility].  At this time, I do not feel there is any life-threatening condition present. I have reviewed, interpreted and discussed all results (EKG, imaging, lab, urine as appropriate) and exam findings with patient/family. I have reviewed nursing notes and appropriate previous records.  I feel the patient is safe to be discharged home without  further emergent workup and can continue workup as an outpatient as needed. Discussed usual and customary return precautions. Patient/family verbalize understanding and are comfortable with this plan.  Outpatient follow-up has been provided as needed. All questions have been answered.  ____________________________________________   FINAL CLINICAL IMPRESSION(S) / ED DIAGNOSES  Final diagnoses:  Acute sinusitis, recurrence not specified, unspecified location  Cervical lymphadenopathy     ED Discharge Orders    None      *Please note:  CECILIA NISHIKAWA was evaluated in Emergency Department on 10/19/2020 for the symptoms described in the history of present illness. He was evaluated in the context of the global COVID-19 pandemic, which necessitated consideration that the patient might be at risk for infection with the SARS-CoV-2 virus that causes COVID-19. Institutional protocols and algorithms that pertain to the evaluation of  patients at risk for COVID-19 are in a state of rapid change based on information released by regulatory bodies including the CDC and federal and state organizations. These policies and algorithms were followed during the patient's care in the ED.  Some ED evaluations and interventions may be delayed as a result of limited staffing during and the pandemic.*   Note:  This document was prepared using Dragon voice recognition software and may include unintentional dictation errors.   Ronald Montgomery, Ronald Bison, DO 10/19/20 [phone]

## 2020-10-26 ENCOUNTER — Other Ambulatory Visit: Payer: Self-pay

## 2020-10-26 ENCOUNTER — Encounter: Payer: Self-pay | Admitting: Intensive Care

## 2020-10-26 ENCOUNTER — Emergency Department
Admission: EM | Admit: 2020-10-26 | Discharge: 2020-10-26 | Disposition: A | Discharge status: Home / Self Care | Payer: Managed Care, Other (non HMO) | Attending: Emergency Medicine | Admitting: Emergency Medicine

## 2020-10-26 DIAGNOSIS — E119 Type 2 diabetes mellitus without complications: Secondary | ICD-10-CM | POA: Insufficient documentation

## 2020-10-26 DIAGNOSIS — F17210 Nicotine dependence, cigarettes, uncomplicated: Secondary | ICD-10-CM | POA: Diagnosis not present

## 2020-10-26 DIAGNOSIS — L0291 Cutaneous abscess, unspecified: Secondary | ICD-10-CM

## 2020-10-26 DIAGNOSIS — L0231 Cutaneous abscess of buttock: Secondary | ICD-10-CM | POA: Diagnosis not present

## 2020-10-26 DIAGNOSIS — Z7984 Long term (current) use of oral hypoglycemic drugs: Secondary | ICD-10-CM | POA: Diagnosis not present

## 2020-10-26 DIAGNOSIS — J45909 Unspecified asthma, uncomplicated: Secondary | ICD-10-CM | POA: Insufficient documentation

## 2020-10-26 MED ORDER — LIDOCAINE HCL (PF) 1 % IJ SOLN
10.0000 mL | Freq: Once | INTRAMUSCULAR | Status: AC
  Administered 2020-10-26: 10 mL
  Filled 2020-10-26: qty 10

## 2020-10-26 MED ORDER — HYDROCODONE-ACETAMINOPHEN 5-325 MG PO TABS
1.0000 | ORAL_TABLET | Freq: Three times a day (TID) | ORAL | 0 refills | Status: DC | PRN
Start: 2020-10-26 — End: 2020-12-29

## 2020-10-26 MED ORDER — SULFAMETHOXAZOLE-TRIMETHOPRIM 800-160 MG PO TABS
1.0000 | ORAL_TABLET | Freq: Once | ORAL | Status: AC
  Administered 2020-10-26: 1 via ORAL
  Filled 2020-10-26: qty 1

## 2020-10-26 MED ORDER — SULFAMETHOXAZOLE-TRIMETHOPRIM 800-160 MG PO TABS: 1.0000 | ORAL_TABLET | Freq: Two times a day (BID) | ORAL | 0 refills | Status: DC

## 2020-10-26 NOTE — ED Provider Notes (Signed)
[facility] Emergency Department Provider Note ____________________________________________  Time seen: 1602  I have reviewed the triage vital signs and the nursing notes.  HISTORY  Chief Complaint  Abscess  HPI Ronald Montgomery is a 40 y.o. male presents to the ED for evaluation of a tender firm area to the back of the left thigh.  Patient describes onset several days ago with ongoing tenderness and redness.  He denies any spontaneous drainage.   Past Medical History:  Diagnosis Date  . Alcohol abuse   . Asthma   . Chlamydia   . Constipation   . Diabetes mellitus without complication ([place])   . Difficult intubation   . Penile pain 02/03/2015   Normal exams x several   . Scabies   . Testicular/scrotal pain 02/03/2015   Normal exam and [place] x several.    . Thyroid disorder    during childhood    Patient Active Problem List   Diagnosis Date Noted  . No-show for appointment 05/20/2020  . Cervical pain 05/11/2020  . Acute right-sided thoracic back pain 05/08/2020  . Lymphocytosis 05/06/2020  . Multiple stiff joints 05/01/2020  . Diabetes mellitus without complication ([place]) 27/11/8673  . Lumbar back pain 05/01/2020  . Screening for blood or protein in urine 05/01/2020  . Body mass index (BMI) of 40.1-44.9 in adult ([place]) 05/01/2020  . Smoker 05/01/2020  . Snoring 05/01/2020  . Poor sleep 05/01/2020  . Fatigue 05/01/2020  . Paresthesia of both lower extremities 05/01/2020  . Vitamin D deficiency 05/01/2020  . Epiglottitis 07/07/2015  . Penile pain 02/03/2015  . Disorder of male genital organs 02/03/2015    Past Surgical History:  Procedure Laterality Date  . ABCESS DRAINAGE     groin area  . CYST EXCISION     top of head  . DIRECT LARYNGOSCOPY  07/07/2015   Procedure: DIRECT LARYNGOSCOPY;  Surgeon: Beverly Gust, MD;  Location: [facility] ORS;  Service: ENT;;  . INCISION AND DRAINAGE ABSCESS N/A 07/07/2015   Procedure: INCISION AND DRAINAGE ABSCESS;   Surgeon: Beverly Gust, MD;  Location: [facility] ORS;  Service: ENT;  Laterality: N/A;  epiglottic abscess  . RHYTIDECTOMY NECK / CHEEK / CHIN     due to accident  . TONSILLECTOMY Bilateral 06/13/2017   Procedure: TONSILLECTOMY;  Surgeon: Beverly Gust, MD;  Location: [facility] ORS;  Service: ENT;  Laterality: Bilateral;    Prior to Admission medications   Medication Sig Start Date End Date Taking? Authorizing Provider  HYDROcodone-acetaminophen (NORCO) 5-325 MG tablet Take 1 tablet by mouth 3 (three) times daily as needed. 10/26/20  Yes Charlynn Salih, Dannielle Karvonen, PA-C  sulfamethoxazole-trimethoprim (BACTRIM DS) 800-160 MG tablet Take 1 tablet by mouth 2 (two) times daily. 10/26/20  Yes Minervia Osso, Dannielle Karvonen, PA-C  blood glucose meter kit and supplies Dispense based on patient and insurance preference. Use up to four times daily as directed. (FOR ICD-10 E10.9, E11.9). 06/03/20   Flinchum, Kelby Aline, FNP  metFORMIN (GLUCOPHAGE) 500 MG tablet Take 2 tablets in the morning (1,023m ) by mouth and 1 tablet in the evening 500 mg Please schedule an office visit before anymore refills. 10/01/20   Chrismon, DVickki Muff PA-C    Allergies Patient has no known allergies.  Family History  Problem Relation Age of Onset  . Skin cancer Mother   . Diabetes Mother   . Thyroid cancer Maternal Grandfather   . Heart attack Maternal Grandfather   . Diabetes Maternal Grandfather     Social  History Social History   Tobacco Use  . Smoking status: Current Every Day Smoker    Packs/day: 0.75    Types: Cigarettes  . Smokeless tobacco: Never Used  Vaping Use  . Vaping Use: Never used  Substance Use Topics  . Alcohol use: Yes    Alcohol/week: 0.0 standard drinks  . Drug use: Not Currently    Types: Marijuana    Review of Systems  Constitutional: Negative for fever. Eyes: Negative for visual changes. Respiratory: Negative for shortness of breath. Gastrointestinal: Negative for abdominal pain, vomiting and  diarrhea. Genitourinary: Negative for dysuria. Musculoskeletal: Negative for back pain. Skin: Negative for rash.  Left buttocks abscess as above. Neurological: Negative for headaches, focal weakness or numbness. ____________________________________________  PHYSICAL EXAM:  VITAL SIGNS: ED Triage Vitals [10/26/20 1449]  Enc Vitals Group     BP (!) 162/100     Pulse Rate 93     Resp 16     Temp 99 F (37.2 C)     Temp Source Oral     SpO2 97 %     Weight 260 lb (117.9 kg)     Height _0  (1.676 m)     Head Circumference      Peak Flow      Pain Score 10     Pain Loc      Pain Edu?      Excl. in [place]?     Constitutional: Alert and oriented. Well appearing and in no distress. Head: Normocephalic and atraumatic. Eyes: Conjunctivae are normal. Normal extraocular movements Cardiovascular: Normal rate, regular rhythm. Normal distal pulses. Respiratory: Normal respiratory effort. Musculoskeletal: Nontender with normal range of motion in all extremities.  Neurologic:  Normal gait without ataxia. Normal speech and language. No gross focal neurologic deficits are appreciated. Skin:  Skin is warm, dry and intact. No rash noted.  Patient with a firm area of induration, pointing and fluctuance noted to the posterior left thigh at the buttocks.  The area measures approximately 2 cm in diameter. Psychiatric: Mood and affect are normal. Patient exhibits appropriate insight and judgment. ____________________________________________  PROCEDURES  Bactrim DS 1 PO  .Marland KitchenIncision and Drainage  Date/Time: 10/26/2020 4:42 PM Performed by: Melvenia Needles, PA-C Authorized by: Melvenia Needles, PA-C   Consent:    Consent obtained:  Verbal   Consent given by:  Patient   Risks, benefits, and alternatives were discussed: yes     Risks discussed:  Bleeding, pain and incomplete drainage   Alternatives discussed:  Delayed treatment Universal protocol:    Procedure explained and  questions answered to patient or proxy's satisfaction: yes     Site/side marked: yes     Patient identity confirmed:  Verbally with patient Location:    Type:  Abscess   Size:  2   Location:  Lower extremity   Lower extremity location:  Buttock   Buttock location:  L buttock Pre-procedure details:    Skin preparation:  Povidone-iodine Sedation:    Sedation type:  None Anesthesia:    Anesthesia method:  Local infiltration   Local anesthetic:  Lidocaine 1% w/o epi Procedure type:    Complexity:  Complex Procedure details:    Ultrasound guidance: no     Needle aspiration: no     Incision types:  Single straight   Incision depth:  Subcutaneous   Wound management:  Probed and deloculated and irrigated with saline   Drainage:  Purulent   Drainage amount:  Copious  Wound treatment:  Wound left open   Packing materials:  1/4 in iodoform gauze   Amount 1/4" iodoform:  2 Post-procedure details:    Procedure completion:  Tolerated well, no immediate complications   ____________________________________________   INITIAL IMPRESSION / ASSESSMENT AND PLAN / ED COURSE  As part of my medical decision making, I reviewed the following data within the electronic MEDICAL RECORD NUMBER Notes from prior ED visits and [place] Controlled Substance Database  DDX: abscess, cellulitis, Fournier's gangrene    Patient ED evaluation management of a left buttocks abscess on presentation.  He agreed to local I&D procedure in the ED.  The wound was prepped and draped, and a single incision was made to the central portion, resulted in copious amounts of purulent drainage.  The wound was packed and dressed and patient was discharged with wound care supplies and instructions.  Prescription for Bactrim as well as a small course of hydrocodone provided with benefit.  He will follow with primary provider in 3 days for wound check and packing removal.  Return precautions of been discussed.  Ronald Montgomery was evaluated  in Emergency Department on 10/26/2020 for the symptoms described in the history of present illness. He was evaluated in the context of the global COVID-19 pandemic, which necessitated consideration that the patient might be at risk for infection with the SARS-CoV-2 virus that causes COVID-19. Institutional protocols and algorithms that pertain to the evaluation of patients at risk for COVID-19 are in a state of rapid change based on information released by regulatory bodies including the CDC and federal and state organizations. These policies and algorithms were followed during the patient's care in the ED.  I reviewed the patient's prescription history over the last 12 months in the multi-state controlled substances database(s) that includes [place], [place], [place], [place], [place], [place], [place], [place], [place], [place], [place], [place], [place], and [place].  Results were notable for no current RX.  ____________________________________________  FINAL CLINICAL IMPRESSION(S) / ED DIAGNOSES  Final diagnoses:  Abscess      Carmie End, Dannielle Karvonen, PA-C 10/26/20 2225    Lucrezia Starch, MD 10/26/20 2314

## 2020-10-26 NOTE — Discharge Instructions (Signed)
Keep the wound clean, dry, and covered.  See your primary provider in 3 days for removal.  Apply warm compresses over the dressing to promote healing.  Take antibiotic as directed, and the pain medicine as needed.

## 2020-10-26 NOTE — ED Notes (Signed)
All dc questions answered, follow up pcp  

## 2020-10-26 NOTE — ED Triage Notes (Signed)
Patient presents with abscess on left buttocks. Reddened in color

## 2020-10-28 ENCOUNTER — Telehealth: Payer: Self-pay

## 2020-10-28 NOTE — Telephone Encounter (Signed)
Called patient to schedule Hospital follow up visit. Left message to call back.

## 2020-10-29 ENCOUNTER — Telehealth: Payer: Self-pay

## 2020-10-29 NOTE — Telephone Encounter (Signed)
Left message to call back  

## 2020-11-04 ENCOUNTER — Inpatient Hospital Stay: Payer: Managed Care, Other (non HMO) | Admitting: Adult Health

## 2020-12-08 ENCOUNTER — Encounter: Payer: Self-pay | Admitting: *Deleted

## 2020-12-08 ENCOUNTER — Emergency Department: Payer: Managed Care, Other (non HMO)

## 2020-12-08 ENCOUNTER — Other Ambulatory Visit: Payer: Self-pay

## 2020-12-08 ENCOUNTER — Emergency Department
Admission: EM | Admit: 2020-12-08 | Discharge: 2020-12-09 | Disposition: A | Discharge status: Home / Self Care | Payer: Managed Care, Other (non HMO) | Attending: Emergency Medicine | Admitting: Emergency Medicine

## 2020-12-08 DIAGNOSIS — Z7984 Long term (current) use of oral hypoglycemic drugs: Secondary | ICD-10-CM | POA: Diagnosis not present

## 2020-12-08 DIAGNOSIS — E119 Type 2 diabetes mellitus without complications: Secondary | ICD-10-CM | POA: Insufficient documentation

## 2020-12-08 DIAGNOSIS — S6992XA Unspecified injury of left wrist, hand and finger(s), initial encounter: Secondary | ICD-10-CM | POA: Insufficient documentation

## 2020-12-08 DIAGNOSIS — F17210 Nicotine dependence, cigarettes, uncomplicated: Secondary | ICD-10-CM | POA: Diagnosis not present

## 2020-12-08 DIAGNOSIS — Z202 Contact with and (suspected) exposure to infections with a predominantly sexual mode of transmission: Secondary | ICD-10-CM | POA: Diagnosis not present

## 2020-12-08 DIAGNOSIS — J45909 Unspecified asthma, uncomplicated: Secondary | ICD-10-CM | POA: Insufficient documentation

## 2020-12-08 DIAGNOSIS — W231XXA Caught, crushed, jammed, or pinched between stationary objects, initial encounter: Secondary | ICD-10-CM | POA: Insufficient documentation

## 2020-12-08 LAB — URINALYSIS, COMPLETE (UACMP) WITH MICROSCOPIC
Bacteria, UA: NONE SEEN
Bilirubin Urine: NEGATIVE
Glucose, UA: >=500 mg/dL — AB
Hgb urine dipstick: NEGATIVE
Ketones, ur: NEGATIVE mg/dL
Leukocytes,Ua: NEGATIVE
Nitrite: NEGATIVE
Protein, ur: NEGATIVE mg/dL
Specific Gravity, Urine: 1.023 (ref 1.005–1.030)
Squamous Epithelial / HPF: NONE SEEN (ref 0–5)
WBC, UA: NONE SEEN WBC/hpf (ref 0–5)
pH: 5 (ref 5.0–8.0)

## 2020-12-08 LAB — CBG MONITORING, ED: Glucose-Capillary: 282 mg/dL — ABNORMAL HIGH (ref 70–99)

## 2020-12-08 MED ORDER — METRONIDAZOLE 500 MG PO TABS
2000.0000 mg | ORAL_TABLET | Freq: Once | ORAL | Status: AC
  Administered 2020-12-08: 2000 mg via ORAL
  Filled 2020-12-08: qty 4

## 2020-12-08 MED ORDER — CEFTRIAXONE SODIUM 1 G IJ SOLR
500.0000 mg | Freq: Once | INTRAMUSCULAR | Status: AC
  Administered 2020-12-08: 500 mg via INTRAMUSCULAR
  Filled 2020-12-08: qty 10

## 2020-12-08 MED ORDER — DOXYCYCLINE HYCLATE 100 MG PO TBEC: 100.0000 mg | DELAYED_RELEASE_TABLET | Freq: Two times a day (BID) | ORAL | 0 refills | Status: AC

## 2020-12-08 NOTE — Discharge Instructions (Addendum)
Take doxycycline twice daily for the next 7 days. Please abstain from unprotected sex for the next 7 days.

## 2020-12-08 NOTE — ED Triage Notes (Signed)
Pt reports urinary frequency, says he is concerned about an STD. Also smashed his left hand with a ratchet 3 weeks ago, having pain in the left hand.

## 2020-12-08 NOTE — ED Provider Notes (Signed)
[facility]-EMERGENCY DEPARTMENT  ____________________________________________  Time seen: Approximately 11:49 PM  I have reviewed the triage vital signs and the nursing notes.   HISTORY  Chief Complaint Urinary Frequency   Historian Patient     HPI Ronald Montgomery is a 40 y.o. male presents to the emergency department with mild dysuria and increased urinary frequency.  Patient has concern for STDs as he has had recent unprotected sex.  Patient denies flank pain, nausea, vomiting or fever at home.   Past Medical History:  Diagnosis Date   Alcohol abuse    Asthma    Chlamydia    Constipation    Diabetes mellitus without complication ([place])    Difficult intubation    Penile pain 02/03/2015   Normal exams x several    Scabies    Testicular/scrotal pain 02/03/2015   Normal exam and [place] x several.     Thyroid disorder    during childhood     Immunizations up to date:  Yes.     Past Medical History:  Diagnosis Date   Alcohol abuse    Asthma    Chlamydia    Constipation    Diabetes mellitus without complication ([place])    Difficult intubation    Penile pain 02/03/2015   Normal exams x several    Scabies    Testicular/scrotal pain 02/03/2015   Normal exam and [place] x several.     Thyroid disorder    during childhood    Patient Active Problem List   Diagnosis Date Noted   No-show for appointment 05/20/2020   Cervical pain 05/11/2020   Acute right-sided thoracic back pain 05/08/2020   Lymphocytosis 05/06/2020   Multiple stiff joints 05/01/2020   Diabetes mellitus without complication ([place]) 47/42/5956   Lumbar back pain 05/01/2020   Screening for blood or protein in urine 05/01/2020   Body mass index (BMI) of 40.1-44.9 in adult [facility]) 05/01/2020   Smoker 05/01/2020   Snoring 05/01/2020   Poor sleep 05/01/2020   Fatigue 05/01/2020   Paresthesia of both lower extremities 05/01/2020   Vitamin D deficiency 05/01/2020   Epiglottitis 07/07/2015   Penile pain 02/03/2015    Disorder of male genital organs 02/03/2015    Past Surgical History:  Procedure Laterality Date   ABCESS DRAINAGE     groin area   CYST EXCISION     top of head   DIRECT LARYNGOSCOPY  07/07/2015   Procedure: DIRECT LARYNGOSCOPY;  Surgeon: Beverly Gust, MD;  Location: [facility] ORS;  Service: ENT;;   INCISION AND DRAINAGE ABSCESS N/A 07/07/2015   Procedure: INCISION AND DRAINAGE ABSCESS;  Surgeon: Beverly Gust, MD;  Location: [facility] ORS;  Service: ENT;  Laterality: N/A;  epiglottic abscess   RHYTIDECTOMY NECK / CHEEK / CHIN     due to accident   TONSILLECTOMY Bilateral 06/13/2017   Procedure: TONSILLECTOMY;  Surgeon: Beverly Gust, MD;  Location: [facility] ORS;  Service: ENT;  Laterality: Bilateral;    Prior to Admission medications   Medication Sig Start Date End Date Taking? Authorizing Provider  doxycycline (DORYX) 100 MG EC tablet Take 1 tablet (100 mg total) by mouth 2 (two) times daily for 7 days. 12/08/20 12/15/20 Yes Vallarie Mare M, PA-C  blood glucose meter kit and supplies Dispense based on patient and insurance preference. Use up to four times daily as directed. (FOR ICD-10 E10.9, E11.9). 06/03/20   Flinchum, Kelby Aline, FNP  HYDROcodone-acetaminophen (NORCO) 5-325 MG tablet Take 1 tablet by mouth 3 (three) times daily as needed.  10/26/20   Menshew, Dannielle Karvonen, PA-C  metFORMIN (GLUCOPHAGE) 500 MG tablet Take 2 tablets in the morning (1,065m ) by mouth and 1 tablet in the evening 500 mg Please schedule an office visit before anymore refills. 10/01/20   Chrismon, DVickki Muff PA-C  sulfamethoxazole-trimethoprim (BACTRIM DS) 800-160 MG tablet Take 1 tablet by mouth 2 (two) times daily. 10/26/20   Menshew, JDannielle Karvonen PA-C    Allergies Patient has no known allergies.  Family History  Problem Relation Age of Onset   Skin cancer Mother    Diabetes Mother    Thyroid cancer Maternal Grandfather    Heart attack Maternal Grandfather    Diabetes Maternal Grandfather     Social  History Social History   Tobacco Use   Smoking status: Every Day    Packs/day: 0.75    Pack years: 0.00    Types: Cigarettes   Smokeless tobacco: Never  Vaping Use   Vaping Use: Never used  Substance Use Topics   Alcohol use: Yes    Alcohol/week: 0.0 standard drinks   Drug use: Not Currently    Types: Marijuana     Review of Systems  Constitutional: No fever/chills Eyes:  No discharge ENT: No upper respiratory complaints. Respiratory: no cough. No SOB/ use of accessory muscles to breath Gastrointestinal: Patient has increased urinary frequency. Musculoskeletal: Negative for musculoskeletal pain. Skin: Negative for rash, abrasions, lacerations, ecchymosis. ____________________________________________   PHYSICAL EXAM:  VITAL SIGNS: ED Triage Vitals  Enc Vitals Group     BP 12/08/20 2147 (!) 163/107     Pulse Rate 12/08/20 2147 81     Resp 12/08/20 2147 18     Temp 12/08/20 2147 98.5 F (36.9 C)     Temp Source 12/08/20 2147 Oral     SpO2 12/08/20 2147 100 %     Weight --      Height --      Head Circumference --      Peak Flow --      Pain Score 12/08/20 2145 3     Pain Loc --      Pain Edu? --      [place]cl. in [place] --      Constitutional: Alert and oriented. Well appearing and in no acute distress. Eyes: Conjunctivae are normal. PERRL. EOMI. Head: Atraumatic. ENT: Cardiovascular: Normal rate, regular rhythm. Normal S1 and S2.  Good peripheral circulation. Respiratory: Normal respiratory effort without tachypnea or retractions. Lungs CTAB. Good air entry to the bases with no decreased or absent breath sounds Gastrointestinal: Bowel sounds x 4 quadrants. Soft and nontender to palpation. No guarding or rigidity. No distention. Musculoskeletal: Full range of motion to all extremities. No obvious deformities noted Neurologic:  Normal for age. No gross focal neurologic deficits are appreciated.  Skin:  Skin is warm, dry and intact. No rash noted. Psychiatric: Mood  and affect are normal for age. Speech and behavior are normal.   ____________________________________________   LABS (all labs ordered are listed, but only abnormal results are displayed)  Labs Reviewed  URINALYSIS, COMPLETE (UACMP) WITH MICROSCOPIC - Abnormal; Notable for the following components:      Result Value   Color, Urine YELLOW (*)    APPearance CLEAR (*)    Glucose, UA >=500 (*)    All other components within normal limits  CBG MONITORING, ED - Abnormal; Notable for the following components:   Glucose-Capillary 282 (*)    All other components within normal limits  CHLAMYDIA/NGC  RT PCR ([facility] ONLY)             ____________________________________________  EKG   ____________________________________________  RADIOLOGY Unk Pinto, personally viewed and evaluated these images (plain radiographs) as part of my medical decision making, as well as reviewing the written report by the radiologist.  DG Hand Complete Left  Result Date: 12/08/2020 CLINICAL DATA:  40 year old male with trauma to the left hand. EXAM: LEFT HAND - COMPLETE 3+ VIEW COMPARISON:  None. FINDINGS: There is no acute fracture or dislocation. The bones are well mineralized. Mild arthritic changes at the distal interphalangeal joints of the second and third digits. The soft tissues are unremarkable. IMPRESSION: No acute fracture or dislocation. Electronically Signed   By: Anner Crete M.D.   On: 12/08/2020 22:32    ____________________________________________    PROCEDURES  Procedure(s) performed:     Procedures     Medications  cefTRIAXone (ROCEPHIN) injection 500 mg (has no administration in time range)  metroNIDAZOLE (FLAGYL) tablet 2,000 mg (has no administration in time range)     ____________________________________________   INITIAL IMPRESSION / ASSESSMENT AND PLAN / ED COURSE  Pertinent labs & imaging results that were available during my care of the patient were reviewed  by me and considered in my medical decision making (see chart for details).      Assessment and plan Dysuria 40 year old male presents to the emergency department with dysuria and increased urinary frequency for the past 1 to 2 days.  Patient was hypertensive at triage but vital signs otherwise reassuring.  Will treat patient with Rocephin, Flagyl and doxycycline.  Advised patient to follow-up with primary care as needed.      ____________________________________________  FINAL CLINICAL IMPRESSION(S) / ED DIAGNOSES  Final diagnoses:  STD exposure      NEW MEDICATIONS STARTED DURING THIS VISIT:  ED Discharge Orders          Ordered    doxycycline (DORYX) 100 MG EC tablet  2 times daily        12/08/20 2345                This chart was dictated using voice recognition software/Dragon. Despite best efforts to proofread, errors can occur which can change the meaning. Any change was purely unintentional.     Lannie Fields, PA-C 12/08/20 2352    Lucrezia Starch, MD 12/10/20 [phone]

## 2020-12-08 NOTE — ED Triage Notes (Signed)
Pt says that he is taking his metformin as prescribed and just ate dinner.

## 2020-12-19 ENCOUNTER — Other Ambulatory Visit: Payer: Self-pay

## 2020-12-19 ENCOUNTER — Encounter: Payer: Self-pay | Admitting: *Deleted

## 2020-12-19 DIAGNOSIS — R300 Dysuria: Secondary | ICD-10-CM | POA: Diagnosis present

## 2020-12-19 DIAGNOSIS — Z7251 High risk heterosexual behavior: Secondary | ICD-10-CM | POA: Insufficient documentation

## 2020-12-19 DIAGNOSIS — Z5321 Procedure and treatment not carried out due to patient leaving prior to being seen by health care provider: Secondary | ICD-10-CM | POA: Diagnosis not present

## 2020-12-19 DIAGNOSIS — N4889 Other specified disorders of penis: Secondary | ICD-10-CM | POA: Diagnosis present

## 2020-12-19 DIAGNOSIS — Z113 Encounter for screening for infections with a predominantly sexual mode of transmission: Secondary | ICD-10-CM | POA: Insufficient documentation

## 2020-12-19 LAB — URINALYSIS, COMPLETE (UACMP) WITH MICROSCOPIC
Bacteria, UA: NONE SEEN
Bilirubin Urine: NEGATIVE
Glucose, UA: >=500 mg/dL — AB
Hgb urine dipstick: NEGATIVE
Ketones, ur: NEGATIVE mg/dL
Leukocytes,Ua: NEGATIVE
Nitrite: NEGATIVE
Protein, ur: NEGATIVE mg/dL
Specific Gravity, Urine: 1.021 (ref 1.005–1.030)
Squamous Epithelial / HPF: NONE SEEN (ref 0–5)
pH: 7 (ref 5.0–8.0)

## 2020-12-19 NOTE — ED Triage Notes (Signed)
Pt c/o penile pain and burning after urination. Pt is unable to say if he has discharge or incontinence of urine. Pt endorses new sexual partner starting July 7-8 of 2022. One encounter.

## 2020-12-20 ENCOUNTER — Emergency Department
Admission: EM | Admit: 2020-12-20 | Discharge: 2020-12-20 | Disposition: A | Discharge status: Home / Self Care | Payer: Managed Care, Other (non HMO) | Attending: Student in an Organized Health Care Education/Training Program | Admitting: Student in an Organized Health Care Education/Training Program

## 2020-12-20 LAB — CHLAMYDIA/NGC RT PCR (ARMC ONLY)
Chlamydia Tr: NOT DETECTED
N gonorrhoeae: NOT DETECTED

## 2020-12-21 ENCOUNTER — Telehealth: Payer: Self-pay

## 2020-12-21 NOTE — Telephone Encounter (Signed)
Can offer the Thurs appt if available; OK to wait until 8/5.  Can offer [facility] virtual appt.

## 2020-12-21 NOTE — Telephone Encounter (Signed)
Copied from CRM [id number]. Topic: General - Other >> Dec 21, 2020  8:28 AM Ronald Montgomery wrote: Reason for CRM: Patient called in have been seen at the ER have pain in the lower extremities would like to be seen this Thursday 12/24/20 if possible but scheduled for 01/01/21. Please advise Ph# [phone]

## 2020-12-21 NOTE — Telephone Encounter (Signed)
Patient called, left VM to return call to the office concerning an appointment.

## 2020-12-23 NOTE — Telephone Encounter (Signed)
Patient has not returned call to confirm appointment.  Appointment has been cancelled

## 2020-12-24 ENCOUNTER — Other Ambulatory Visit: Payer: Self-pay | Admitting: Family Medicine

## 2020-12-24 ENCOUNTER — Other Ambulatory Visit: Payer: Self-pay

## 2020-12-24 ENCOUNTER — Ambulatory Visit: Payer: Self-pay | Admitting: Family Medicine

## 2020-12-24 ENCOUNTER — Ambulatory Visit: Payer: Self-pay | Admitting: Physician Assistant

## 2020-12-24 DIAGNOSIS — Z113 Encounter for screening for infections with a predominantly sexual mode of transmission: Secondary | ICD-10-CM

## 2020-12-24 LAB — GRAM STAIN

## 2020-12-24 NOTE — Progress Notes (Signed)
Pt here for STD screening.  Gram stain results reviewed, no medication required.  Pt declined condoms. Isaul Landi M Chelcy Bolda, RN  

## 2020-12-25 ENCOUNTER — Encounter: Payer: Self-pay | Admitting: Physician Assistant

## 2020-12-25 NOTE — Progress Notes (Signed)
[facility] Department STI clinic/screening visit  Subjective:  Ronald Montgomery is a 39 y.o. male being seen today for an STI screening visit. The patient reports they do have symptoms.    Patient has the following medical conditions:   Patient Active Problem List   Diagnosis Date Noted   No-show for appointment 05/20/2020   Cervical pain 05/11/2020   Acute right-sided thoracic back pain 05/08/2020   Lymphocytosis 05/06/2020   Multiple stiff joints 05/01/2020   Diabetes mellitus without complication ([facility]) 05/01/2020   Lumbar back pain 05/01/2020   Screening for blood or protein in urine 05/01/2020   Body mass index (BMI) of 40.1-44.9 in adult [facility]) 05/01/2020   Smoker 05/01/2020   Snoring 05/01/2020   Poor sleep 05/01/2020   Fatigue 05/01/2020   Paresthesia of both lower extremities 05/01/2020   Vitamin D deficiency 05/01/2020   Epiglottitis 07/07/2015   Penile pain 02/03/2015   Disorder of male genital organs 02/03/2015     Chief Complaint  Patient presents with   SEXUALLY TRANSMITTED DISEASE    Screening    HPI  Patient reports that he thinks that he may have an UTI but wants to have STD screening as well.  Patient reports achy pain in his groin for 2 weeks and states "it doesn't feel right" when urinating.  Reports that he takes medicine as directed for DM, last HIV test was about 2 yr ago and last void prior to sample collection for Gram stain was over 2 hr ago.     See flowsheet for further details and programmatic requirements.    The following portions of the patient's history were reviewed and updated as appropriate: allergies, current medications, past medical history, past social history, past surgical history and problem list.  Objective:  There were no vitals filed for this visit.  Physical Exam Constitutional:      General: He is not in acute distress.    Appearance: Normal appearance.  HENT:     Head: Normocephalic and atraumatic.      Comments: No nits,lice, or hair loss. No cervical, supraclavicular or axillary adenopathy.     Mouth/Throat:     Mouth: Mucous membranes are moist.     Pharynx: Oropharynx is clear. No oropharyngeal exudate or posterior oropharyngeal erythema.  Eyes:     Conjunctiva/sclera: Conjunctivae normal.  Pulmonary:     Effort: Pulmonary effort is normal.  Abdominal:     Palpations: Abdomen is soft. There is no mass.     Tenderness: There is no abdominal tenderness. There is no guarding or rebound.  Genitourinary:    Penis: Normal.      Testes: Normal.     Comments: Pubic area without nits, lice, hair loss, edema, erythema, lesions and inguinal adenopathy. Penis uncircumcised without rash, lesions and discharge at meatus. Testicles descended bilaterally,nt, no masses or edema.  Musculoskeletal:     Cervical back: Neck supple. No tenderness.  Skin:    General: Skin is warm and dry.     Findings: No bruising, erythema, lesion or rash.  Neurological:     Mental Status: He is alert and oriented to person, place, and time.  Psychiatric:        Mood and Affect: Mood normal.        Behavior: Behavior normal.        Thought Content: Thought content normal.        Judgment: Judgment normal.      Assessment and Plan:  Dorinda Hill  L Seydel is a 40 y.o. male presenting to the [facility] Department for STI screening  1. Screening for STD (sexually transmitted disease) Patient into clinic with symptoms. Reviewed with patient that we can only screen him for STDs and not check for UTI today. Reviewed Gram stain and no treatment is indicated today. Enc patient to follow up at urgent care or PCP for evaluation for UTI. Rec condoms with all sex. Await test results.  Counseled that RN will call if needs to RTC for treatment once results are back.  - Gram stain - HIV [facility] - Syphilis Serology, [facility] - Gonococcus culture - Gonococcus culture     No follow-ups on  file.  Future Appointments  Date Time Provider Department Center  01/01/2021  3:00 PM Fisher, Demetrios Isaacs, MD [id number]    Matt Holmes, [place]

## 2020-12-29 ENCOUNTER — Emergency Department
Admission: EM | Admit: 2020-12-29 | Discharge: 2020-12-29 | Disposition: A | Discharge status: Home / Self Care | Payer: Managed Care, Other (non HMO) | Attending: Emergency Medicine | Admitting: Emergency Medicine

## 2020-12-29 ENCOUNTER — Other Ambulatory Visit: Payer: Self-pay

## 2020-12-29 ENCOUNTER — Emergency Department: Payer: Managed Care, Other (non HMO)

## 2020-12-29 DIAGNOSIS — R300 Dysuria: Secondary | ICD-10-CM | POA: Diagnosis not present

## 2020-12-29 DIAGNOSIS — J45909 Unspecified asthma, uncomplicated: Secondary | ICD-10-CM | POA: Diagnosis not present

## 2020-12-29 DIAGNOSIS — N503 Cyst of epididymis: Secondary | ICD-10-CM | POA: Diagnosis not present

## 2020-12-29 DIAGNOSIS — N5082 Scrotal pain: Secondary | ICD-10-CM | POA: Diagnosis not present

## 2020-12-29 DIAGNOSIS — Z833 Family history of diabetes mellitus: Secondary | ICD-10-CM | POA: Insufficient documentation

## 2020-12-29 DIAGNOSIS — K5900 Constipation, unspecified: Secondary | ICD-10-CM | POA: Insufficient documentation

## 2020-12-29 DIAGNOSIS — E119 Type 2 diabetes mellitus without complications: Secondary | ICD-10-CM | POA: Insufficient documentation

## 2020-12-29 DIAGNOSIS — F17210 Nicotine dependence, cigarettes, uncomplicated: Secondary | ICD-10-CM | POA: Diagnosis not present

## 2020-12-29 DIAGNOSIS — Z7984 Long term (current) use of oral hypoglycemic drugs: Secondary | ICD-10-CM | POA: Insufficient documentation

## 2020-12-29 LAB — URINALYSIS, COMPLETE (UACMP) WITH MICROSCOPIC
Bilirubin Urine: NEGATIVE
Glucose, UA: NEGATIVE mg/dL
Hgb urine dipstick: NEGATIVE
Ketones, ur: 5 mg/dL — AB
Leukocytes,Ua: NEGATIVE
Nitrite: NEGATIVE
Protein, ur: 30 mg/dL — AB
Specific Gravity, Urine: 1.026 (ref 1.005–1.030)
pH: 5 (ref 5.0–8.0)

## 2020-12-29 LAB — HIV ANTIBODY (ROUTINE TESTING W REFLEX): HIV Screen 4th Generation wRfx: NONREACTIVE

## 2020-12-29 LAB — GONOCOCCUS CULTURE

## 2020-12-29 LAB — CHLAMYDIA/NGC RT PCR (ARMC ONLY)
Chlamydia Tr: NOT DETECTED
N gonorrhoeae: NOT DETECTED

## 2020-12-29 MED ORDER — CEFTRIAXONE SODIUM 1 G IJ SOLR
1.0000 g | Freq: Once | INTRAMUSCULAR | Status: AC
  Administered 2020-12-29: 1 g via INTRAMUSCULAR
  Filled 2020-12-29: qty 10

## 2020-12-29 MED ORDER — LIDOCAINE HCL (PF) 1 % IJ SOLN
INTRAMUSCULAR | Status: AC
  Administered 2020-12-29: 2.1 mL
  Filled 2020-12-29: qty 5

## 2020-12-29 MED ORDER — ACETAMINOPHEN 500 MG PO TABS
1000.0000 mg | ORAL_TABLET | Freq: Once | ORAL | Status: AC
  Administered 2020-12-29: 1000 mg via ORAL
  Filled 2020-12-29: qty 2

## 2020-12-29 MED ORDER — IBUPROFEN 400 MG PO TABS
400.0000 mg | ORAL_TABLET | Freq: Once | ORAL | Status: AC
  Administered 2020-12-29: 400 mg via ORAL
  Filled 2020-12-29: qty 1

## 2020-12-29 MED ORDER — DOXYCYCLINE HYCLATE 100 MG PO CAPS: 100.0000 mg | ORAL_CAPSULE | Freq: Two times a day (BID) | ORAL | 0 refills | Status: AC

## 2020-12-29 NOTE — ED Notes (Signed)
Pt with similar symptoms as last visit. taking doxycycline as prescribed. pt states this feels like when his prostate was inflamed.

## 2020-12-29 NOTE — ED Provider Notes (Signed)
 [facility] Emergency Department Provider Note  ____________________________________________   Event Date/Time   First MD Initiated Contact with Patient 12/29/20 1219     (approximate)  I have reviewed the triage vital signs and the nursing notes.   HISTORY  Chief Complaint groin discomfort   HPI Ronald Montgomery is a 40 y.o. male with a past medical history of asthma, alcohol abuse, recurrent scrotal and testicular pain at 1 point diagnosed with chlamydia who presents for assessment of several weeks of some urethral burning and soreness in his groin and perineum.  Patient states he often feels this after he urinates and sometimes when he is sitting for long period of time.  He denies any abnormal penile discharge or rash.  Denies any penile lesions or scrotal lesions.  Denies any specific testicular pain.  Sometimes will have some constipation but has not had any diarrhea and has had a bowel movement within the last 48 hours.  He has no fevers, chills, headache or earache, sore throat, nausea, vomiting, chest pain, cough, shortness of breath or other acute abdominal or back pain rash or extremity pain.  Denies any recent injuries or falls.         Past Medical History:  Diagnosis Date   Alcohol abuse    Asthma    Chlamydia    Constipation    Diabetes mellitus without complication (HCC)    Difficult intubation    Penile pain 02/03/2015   Normal exams x several    Scabies    Testicular/scrotal pain 02/03/2015   Normal exam and US x several.     Thyroid disorder    during childhood    Patient Active Problem List   Diagnosis Date Noted   No-show for appointment 05/20/2020   Cervical pain 05/11/2020   Acute right-sided thoracic back pain 05/08/2020   Lymphocytosis 05/06/2020   Multiple stiff joints 05/01/2020   Diabetes mellitus without complication (HCC) 05/01/2020   Lumbar back pain 05/01/2020   Screening for blood or protein in urine 05/01/2020    Body mass index (BMI) of 40.1-44.9 in adult (HCC) 05/01/2020   Smoker 05/01/2020   Snoring 05/01/2020   Poor sleep 05/01/2020   Fatigue 05/01/2020   Paresthesia of both lower extremities 05/01/2020   Vitamin D deficiency 05/01/2020   Epiglottitis 07/07/2015   Penile pain 02/03/2015   Disorder of male genital organs 02/03/2015    Past Surgical History:  Procedure Laterality Date   ABCESS DRAINAGE     groin area   CYST EXCISION     top of head   DIRECT LARYNGOSCOPY  07/07/2015   Procedure: DIRECT LARYNGOSCOPY;  Surgeon: Chapman McQueen, MD;  Location: [facility] ORS;  Service: ENT;;   INCISION AND DRAINAGE ABSCESS N/A 07/07/2015   Procedure: INCISION AND DRAINAGE ABSCESS;  Surgeon: Chapman McQueen, MD;  Location: [facility] ORS;  Service: ENT;  Laterality: N/A;  epiglottic abscess   RHYTIDECTOMY NECK / CHEEK / CHIN     due to accident   TONSILLECTOMY Bilateral 06/13/2017   Procedure: TONSILLECTOMY;  Surgeon: McQueen, Chapman, MD;  Location: [facility] ORS;  Service: ENT;  Laterality: Bilateral;    Prior to Admission medications   Medication Sig Start Date End Date Taking? Authorizing Provider  doxycycline (VIBRAMYCIN) 100 MG capsule Take 1 capsule (100 mg total) by mouth 2 (two) times daily for 14 days. 12/29/20 01/12/21 Yes ,  P, MD  blood glucose meter kit and supplies Dispense based on patient and insurance preference. Use   up to four times daily as directed. (FOR ICD-10 E10.9, E11.9). 06/03/20   Flinchum, Kelby Aline, FNP  metFORMIN (GLUCOPHAGE) 500 MG tablet Take 2 tablets in the morning (1,040m ) by mouth and 1 tablet in the evening 500 mg Please schedule an office visit before anymore refills. 10/01/20   Chrismon, DVickki Muff PA-C    Allergies Patient has no known allergies.  Family History  Problem Relation Age of Onset   Skin cancer Mother    Diabetes Mother    Thyroid cancer Maternal Grandfather    Heart attack Maternal Grandfather    Diabetes Maternal Grandfather     Social  History Social History   Tobacco Use   Smoking status: Every Day    Packs/day: 0.75    Types: Cigarettes   Smokeless tobacco: Never  Vaping Use   Vaping Use: Never used  Substance Use Topics   Alcohol use: Not Currently    Comment: patient reports quit drinking in 09/2020.   Drug use: Not Currently    Types: Marijuana    Review of Systems  Review of Systems  Constitutional:  Negative for chills and fever.  HENT:  Negative for sore throat.   Eyes:  Negative for pain.  Respiratory:  Negative for cough and stridor.   Cardiovascular:  Negative for chest pain.  Gastrointestinal:  Negative for vomiting.  Genitourinary:  Positive for dysuria.  Musculoskeletal:  Negative for myalgias.  Skin:  Negative for rash.  Neurological:  Negative for seizures, loss of consciousness and headaches.  Psychiatric/Behavioral:  Negative for suicidal ideas.   All other systems reviewed and are negative.    ____________________________________________   PHYSICAL EXAM:  VITAL SIGNS: ED Triage Vitals  Enc Vitals Group     BP 12/29/20 1127 (!) 127/91     Pulse Rate 12/29/20 1127 89     Resp 12/29/20 1127 20     Temp 12/29/20 1127 98.2 F (36.8 C)     Temp Source 12/29/20 1127 Oral     SpO2 12/29/20 1127 100 %     Weight 12/29/20 1128 257 lb 15 oz (117 kg)     Height 12/29/20 1128 5' 6" (1.676 m)     Head Circumference --      Peak Flow --      Pain Score 12/29/20 1128 4     Pain Loc --      Pain Edu? --      Excl. in [place] --    Vitals:   12/29/20 1127  BP: (!) 127/91  Pulse: 89  Resp: 20  Temp: 98.2 F (36.8 C)  SpO2: 100%   Physical Exam Vitals and nursing note reviewed. Exam conducted with a chaperone present.  Constitutional:      Appearance: He is well-developed.  HENT:     Head: Normocephalic and atraumatic.     Right Ear: External ear normal.     Left Ear: External ear normal.     Nose: Nose normal.  Eyes:     Conjunctiva/sclera: Conjunctivae normal.   Cardiovascular:     Rate and Rhythm: Normal rate and regular rhythm.     Heart sounds: No murmur heard. Pulmonary:     Effort: Pulmonary effort is normal. No respiratory distress.  Abdominal:     Palpations: Abdomen is soft.     Tenderness: There is no abdominal tenderness.  Musculoskeletal:     Cervical back: Neck supple.  Skin:    General: Skin is warm and dry.  Capillary Refill: Capillary refill takes less than 2 seconds.  Neurological:     Mental Status: He is alert and oriented to person, place, and time.  Psychiatric:        Mood and Affect: Mood normal.    No inguinal hernia palpated.  Bilateral descended testicles palpated.  No significant masses, edema, tenderness, erythema induration or other changes noted on exam of the scrotum and testicles or penile shaft.  There is no erythema tenderness warmth induration or fluctuance over the perineum. ____________________________________________   LABS (all labs ordered are listed, but only abnormal results are displayed)  Labs Reviewed  URINALYSIS, COMPLETE (UACMP) WITH MICROSCOPIC - Abnormal; Notable for the following components:      Result Value   Color, Urine YELLOW (*)    APPearance HAZY (*)    Ketones, ur 5 (*)    Protein, ur 30 (*)    Bacteria, UA RARE (*)    All other components within normal limits  CHLAMYDIA/NGC RT PCR ([facility] ONLY)            URINE CULTURE  HIV ANTIBODY (ROUTINE TESTING W REFLEX)  RPR   ____________________________________________  EKG  ____________________________________________  RADIOLOGY  ED MD interpretation: Scrotal ultrasound shows bilateral epididymal cysts versus spermatoceles without evidence of torsion or epididymitis.  Official radiology report(s): US SCROTUM W/DOPPLER  Result Date: 12/29/2020 CLINICAL DATA:  Scrotal pain for several weeks EXAM: SCROTAL ULTRASOUND DOPPLER ULTRASOUND OF THE TESTICLES TECHNIQUE: Complete ultrasound examination of the testicles, epididymis,  and other scrotal structures was performed. Color and spectral Doppler ultrasound were also utilized to evaluate blood flow to the testicles. COMPARISON:  01/27/2015 ultrasound FINDINGS: Right testicle Measurements: 3.8 x 2.1 x 2.8 cm. No mass or microlithiasis visualized. Left testicle Measurements: 4.2 x 2.3 x 3.2 cm. No mass or microlithiasis visualized. Right epididymis: Cyst or spermatocele with small internal debris measuring 1.3 x 1.3 x 1.6 cm, grossly unchanged. Left epididymis: Multiple small cysts or spermatoceles measuring up to 7 x 3 x 5 mm. Hydrocele:  None visualized. Varicocele:  None visualized. Pulsed Doppler interrogation of both testes demonstrates normal low resistance arterial and venous waveforms bilaterally. IMPRESSION: 1. Negative for testicular torsion or intratesticular mass lesion. 2. Bilateral epididymal cysts or spermatoceles. Electronically Signed   By: Kim  Fujinaga M.D.   On: 12/29/2020 15:20    ____________________________________________   PROCEDURES  Procedure(s) performed (including Critical Care):  Procedures   ____________________________________________   INITIAL IMPRESSION / ASSESSMENT AND PLAN / ED COURSE      Patient presents with above to history exam for several weeks of burning with urination which seems to be particularly worsened when he is ejaculating.  He states he sometimes has some soreness in his scrotum and swelling is sitting although no current pain.  On arrival he is afebrile hemodynamically stable.  His GU exam is unremarkable and there is no evidence of cellulitis, HSV infection or lesions to suggest syphilis.  However it seems patient was supposed to get HIV and syphilis testing couple days ago and has not gotten this done so we will send these as well today in the ED and patient follows up outpatient.  Scrotal ultrasound shows bilateral epididymal cysts versus spermatoceles without evidence of torsion or epididymitis.  Evidence of  abscess.  UA shows some rare bacteria and protein without other evidence clearly suggesting infection.  Urine culture sent.  GC is negative.  Given complaints of burning with urination will cover possible sexually transmitted infection patient did   receive a dose of IM Rocephin prior to [facility] results.  Will prescribe a short course of doxycycline as well.  Certainly possible patient experiencing some reflux of ejaculate although this would not explain the burning between episodes of regulation.  No suspicion for any life-threatening process and there is no evidence of necrotizing or other Mi life-threatening infection.  Will patient follow-up with urology.  Discharged stable condition.  Strict return precautions advised and discussed.        ____________________________________________   FINAL CLINICAL IMPRESSION(S) / ED DIAGNOSES  Final diagnoses:  Dysuria    Medications  cefTRIAXone (ROCEPHIN) injection 1 g (1 g Intramuscular Given 12/29/20 1309)  ibuprofen (ADVIL) tablet 400 mg (400 mg Oral Given 12/29/20 1304)  acetaminophen (TYLENOL) tablet 1,000 mg (1,000 mg Oral Given 12/29/20 1304)  lidocaine (PF) (XYLOCAINE) 1 % injection (2.1 mLs  Given 12/29/20 1310)     ED Discharge Orders          Ordered    doxycycline (VIBRAMYCIN) 100 MG capsule  2 times daily        12/29/20 1533             Note:  This document was prepared using Dragon voice recognition software and may include unintentional dictation errors.    Lucrezia Starch, MD 12/29/20 1539

## 2020-12-29 NOTE — ED Triage Notes (Signed)
Pt to ED for discomfort to scrotum, trouble ejaculating and burning with urination.  Denies swelling States scrotum hurts while sitting Has had sx since seen on 7/23 Hx of enlarged prostate

## 2020-12-30 LAB — URINE CULTURE: Culture: <10000 — AB

## 2020-12-30 LAB — RPR: RPR Ser Ql: NONREACTIVE

## 2021-01-01 ENCOUNTER — Inpatient Hospital Stay: Payer: Managed Care, Other (non HMO) | Admitting: Family Medicine

## 2021-01-08 NOTE — Addendum Note (Signed)
Addended by: Heywood Bene on: 01/08/2021 10:33 AM   Modules accepted: Orders

## 2021-01-19 ENCOUNTER — Emergency Department
Admission: EM | Admit: 2021-01-19 | Discharge: 2021-01-19 | Disposition: A | Discharge status: Home / Self Care | Payer: Managed Care, Other (non HMO) | Attending: Emergency Medicine | Admitting: Emergency Medicine

## 2021-01-19 ENCOUNTER — Other Ambulatory Visit: Payer: Self-pay

## 2021-01-19 DIAGNOSIS — Z7984 Long term (current) use of oral hypoglycemic drugs: Secondary | ICD-10-CM | POA: Diagnosis not present

## 2021-01-19 DIAGNOSIS — J029 Acute pharyngitis, unspecified: Secondary | ICD-10-CM | POA: Diagnosis present

## 2021-01-19 DIAGNOSIS — F17210 Nicotine dependence, cigarettes, uncomplicated: Secondary | ICD-10-CM | POA: Insufficient documentation

## 2021-01-19 DIAGNOSIS — R682 Dry mouth, unspecified: Secondary | ICD-10-CM

## 2021-01-19 DIAGNOSIS — B370 Candidal stomatitis: Secondary | ICD-10-CM | POA: Diagnosis not present

## 2021-01-19 DIAGNOSIS — J45909 Unspecified asthma, uncomplicated: Secondary | ICD-10-CM | POA: Insufficient documentation

## 2021-01-19 DIAGNOSIS — E119 Type 2 diabetes mellitus without complications: Secondary | ICD-10-CM | POA: Insufficient documentation

## 2021-01-19 LAB — CHLAMYDIA/NGC RT PCR (ARMC ONLY)
Chlamydia Tr: NOT DETECTED
N gonorrhoeae: NOT DETECTED

## 2021-01-19 LAB — GROUP A STREP BY PCR: Group A Strep by PCR: NOT DETECTED

## 2021-01-19 MED ORDER — CLOTRIMAZOLE 10 MG MT TROC: 10.0000 mg | Freq: Every day | OROMUCOSAL | 0 refills | Status: DC

## 2021-01-19 MED ORDER — CLOTRIMAZOLE 10 MG MT TROC
10.0000 mg | Freq: Once | OROMUCOSAL | Status: AC
  Administered 2021-01-19: 10 mg via ORAL
  Filled 2021-01-19: qty 1

## 2021-01-19 NOTE — ED Triage Notes (Signed)
Pt c/o dry sore throat with sore mouth for the past 2-3 days.

## 2021-01-19 NOTE — Discharge Instructions (Addendum)
Use the throat lozenges as directed. You may follow your pending lab results on [facility] MyChart.

## 2021-01-19 NOTE — ED Notes (Signed)
See triage note  Presents with sore throat for the past couple of days   Subjective fever states he "just doesn't feel well"  Afebrile on arrival also has had oral sex with a new partner last week

## 2021-01-20 NOTE — ED Provider Notes (Signed)
[facility] Emergency Department Provider Note ____________________________________________  Time seen: 106  I have reviewed the triage vital signs and the nursing notes.  HISTORY  Chief Complaint  Sore Throat   HPI Ronald Montgomery is a 40 y.o. male presents himself to the ED with complaints of a sore throat as well as dry irritated mouth for the last 2 to 3 days.  Patient denies any interim fever, chills, sweats [place] denies difficulty swallowing, breathing, or controlling oral secretions.  Patient is concerned because he performed unprotected oral sex on a male companion 2 weeks ago.  He denies any interim fever, chills, or sweats.  Past Medical History:  Diagnosis Date   Alcohol abuse    Asthma    Chlamydia    Constipation    Diabetes mellitus without complication ([place])    Difficult intubation    Penile pain 02/03/2015   Normal exams x several    Scabies    Testicular/scrotal pain 02/03/2015   Normal exam and [place] x several.     Thyroid disorder    during childhood    Patient Active Problem List   Diagnosis Date Noted   No-show for appointment 05/20/2020   Cervical pain 05/11/2020   Acute right-sided thoracic back pain 05/08/2020   Lymphocytosis 05/06/2020   Multiple stiff joints 05/01/2020   Diabetes mellitus without complication ([place]) 16/02/9603   Lumbar back pain 05/01/2020   Screening for blood or protein in urine 05/01/2020   Body mass index (BMI) of 40.1-44.9 in adult [facility]) 05/01/2020   Smoker 05/01/2020   Snoring 05/01/2020   Poor sleep 05/01/2020   Fatigue 05/01/2020   Paresthesia of both lower extremities 05/01/2020   Vitamin D deficiency 05/01/2020   Epiglottitis 07/07/2015   Penile pain 02/03/2015   Disorder of male genital organs 02/03/2015    Past Surgical History:  Procedure Laterality Date   ABCESS DRAINAGE     groin area   CYST EXCISION     top of head   DIRECT LARYNGOSCOPY  07/07/2015   Procedure: DIRECT LARYNGOSCOPY;   Surgeon: Beverly Gust, MD;  Location: [facility] ORS;  Service: ENT;;   INCISION AND DRAINAGE ABSCESS N/A 07/07/2015   Procedure: INCISION AND DRAINAGE ABSCESS;  Surgeon: Beverly Gust, MD;  Location: [facility] ORS;  Service: ENT;  Laterality: N/A;  epiglottic abscess   RHYTIDECTOMY NECK / CHEEK / CHIN     due to accident   TONSILLECTOMY Bilateral 06/13/2017   Procedure: TONSILLECTOMY;  Surgeon: Beverly Gust, MD;  Location: [facility] ORS;  Service: ENT;  Laterality: Bilateral;    Prior to Admission medications   Medication Sig Start Date End Date Taking? Authorizing Provider  clotrimazole (MYCELEX) 10 MG troche Take 1 tablet (10 mg total) by mouth 5 (five) times daily. 01/19/21  Yes Nikita Humble, Dannielle Karvonen, PA-C  blood glucose meter kit and supplies Dispense based on patient and insurance preference. Use up to four times daily as directed. (FOR ICD-10 E10.9, E11.9). 06/03/20   Flinchum, Kelby Aline, FNP  metFORMIN (GLUCOPHAGE) 500 MG tablet Take 2 tablets in the morning (1,051m ) by mouth and 1 tablet in the evening 500 mg Please schedule an office visit before anymore refills. 10/01/20   Chrismon, DVickki Muff PA-C    Allergies Patient has no known allergies.  Family History  Problem Relation Age of Onset   Skin cancer Mother    Diabetes Mother    Thyroid cancer Maternal Grandfather    Heart attack Maternal Grandfather  Diabetes Maternal Grandfather     Social History Social History   Tobacco Use   Smoking status: Every Day    Packs/day: 0.75    Types: Cigarettes   Smokeless tobacco: Never  Vaping Use   Vaping Use: Never used  Substance Use Topics   Alcohol use: Not Currently    Comment: patient reports quit drinking in 09/2020.   Drug use: Not Currently    Types: Marijuana    Review of Systems  Constitutional: Negative for fever. Eyes: Negative for visual changes. ENT: Negative for sore throat.  Reports dry mouth as above. Cardiovascular: Negative for chest pain. Respiratory:  Negative for shortness of breath. Gastrointestinal: Negative for abdominal pain, vomiting and diarrhea. Genitourinary: Negative for dysuria. Musculoskeletal: Negative for back pain. Skin: Negative for rash. Neurological: Negative for headaches, focal weakness or numbness. ____________________________________________  PHYSICAL EXAM:  VITAL SIGNS: ED Triage Vitals  Enc Vitals Group     BP 01/19/21 0731 (!) 132/93     Pulse Rate 01/19/21 0731 95     Resp 01/19/21 0731 18     Temp 01/19/21 0731 98.7 F (37.1 C)     Temp Source 01/19/21 0731 Oral     SpO2 01/19/21 0731 100 %     Weight 01/19/21 0732 260 lb (117.9 kg)     Height 01/19/21 0732 5' 6"  (1.676 m)     Head Circumference --      Peak Flow --      Pain Score 01/19/21 0732 2     Pain Loc --      Pain Edu? --      Excl. in [place]? --     Constitutional: Alert and oriented. Well appearing and in no distress. Head: Normocephalic and atraumatic. Eyes: Conjunctivae are normal. Normal extraocular movements Mouth/Throat: Mucous membranes are moist.  Patient with some mild buccal surface erythema noted.  He has also some slight white haze to the buccal mucosa of the lower lip and cheek.  Tonsils are absent and oropharynx is mildly erythematous.  No exudates are appreciated. Neck: Supple. No thyromegaly. Hematological/Lymphatic/Immunological: No cervical lymphadenopathy. Cardiovascular: Normal rate, regular rhythm. Normal distal pulses. Respiratory: Normal respiratory effort. No wheezes/rales/rhonchi. Gastrointestinal: Soft and nontender. No distention. Musculoskeletal: Nontender with normal range of motion in all extremities.  Neurologic:  Normal gait without ataxia. Normal speech and language. No gross focal neurologic deficits are appreciated. Skin:  Skin is warm, dry and intact. No rash noted. Psychiatric: Mood and affect are normal. Patient exhibits appropriate insight and judgment. ____________________________________________     {LABS (pertinent positives/negatives)  Labs Reviewed  GROUP A STREP BY PCR  CHLAMYDIA/NGC RT PCR ([facility] ONLY)             ____________________________________________  {EKG  ____________________________________________   RADIOLOGY Official radiology report(s): No results found. ____________________________________________  PROCEDURES  Clotrimazole troche PO  Procedures ____________________________________________   INITIAL IMPRESSION / ASSESSMENT AND PLAN / ED COURSE  As part of my medical decision making, I reviewed the following data within the [place] reviewed as noted and Notes from prior ED visits      DDX: thrush, strep throat, gonococcal pharyngitis  Patient ED evaluation of some sore throat and dry mouth, concerning for possible STD pharyngitis.  Patient was evaluated for his complaints in the ED, with a reassuring exam without signs of exudative tonsillitis, fevers, or airway irritation.  He did have some mild oropharyngeal erythema and some patchy white lesions, concerning for possible candidiasis.  Will treat empirically with clotrimazole atrocious.  He will follow with primary provider return to ED if needed.  REACE BRESHEARS was evaluated in Emergency Department on 01/20/2021 for the symptoms described in the history of present illness. He was evaluated in the context of the global COVID-19 pandemic, which necessitated consideration that the patient might be at risk for infection with the SARS-CoV-2 virus that causes COVID-19. Institutional protocols and algorithms that pertain to the evaluation of patients at risk for COVID-19 are in a state of rapid change based on information released by regulatory bodies including the CDC and federal and state organizations. These policies and algorithms were followed during the patient's care in the ED.  ____________________________________________  FINAL CLINICAL IMPRESSION(S) / ED DIAGNOSES  Final  diagnoses:  Oral thrush  Dry mouth      Carmie End, Dannielle Karvonen, PA-C 01/20/21 0758    Nena Polio, MD 01/20/21 [phone]

## 2021-01-24 ENCOUNTER — Other Ambulatory Visit: Payer: Self-pay | Admitting: Family Medicine

## 2021-02-04 ENCOUNTER — Other Ambulatory Visit: Payer: Self-pay | Admitting: Family Medicine

## 2021-02-04 ENCOUNTER — Encounter: Payer: Self-pay | Admitting: Urology

## 2021-02-04 ENCOUNTER — Telehealth: Payer: Self-pay | Admitting: Adult Health

## 2021-02-04 ENCOUNTER — Ambulatory Visit (INDEPENDENT_AMBULATORY_CARE_PROVIDER_SITE_OTHER): Payer: Managed Care, Other (non HMO) | Admitting: Urology

## 2021-02-04 ENCOUNTER — Other Ambulatory Visit: Payer: Self-pay

## 2021-02-04 VITALS — BP 137/86 | HR 81 | Ht 66.0 in | Wt 251.8 lb

## 2021-02-04 DIAGNOSIS — R3911 Hesitancy of micturition: Secondary | ICD-10-CM | POA: Diagnosis not present

## 2021-02-04 DIAGNOSIS — N481 Balanitis: Secondary | ICD-10-CM

## 2021-02-04 DIAGNOSIS — R102 Pelvic and perineal pain: Secondary | ICD-10-CM | POA: Diagnosis not present

## 2021-02-04 DIAGNOSIS — R3912 Poor urinary stream: Secondary | ICD-10-CM | POA: Diagnosis not present

## 2021-02-04 DIAGNOSIS — E119 Type 2 diabetes mellitus without complications: Secondary | ICD-10-CM

## 2021-02-04 MED ORDER — TAMSULOSIN HCL 0.4 MG PO CAPS
0.4000 mg | ORAL_CAPSULE | Freq: Two times a day (BID) | ORAL | 0 refills | Status: DC
Start: 2021-02-04 — End: 2021-03-08

## 2021-02-04 MED ORDER — CELECOXIB 200 MG PO CAPS: 200.0000 mg | ORAL_CAPSULE | Freq: Two times a day (BID) | ORAL | 0 refills | Status: DC

## 2021-02-04 MED ORDER — METFORMIN HCL 500 MG PO TABS: ORAL_TABLET | ORAL | 0 refills | Status: DC

## 2021-02-04 MED ORDER — NYSTATIN-TRIAMCINOLONE 100000-0.1 UNIT/GM-% EX CREA: 1.0000 "application " | TOPICAL_CREAM | Freq: Two times a day (BID) | CUTANEOUS | 0 refills | Status: AC

## 2021-02-04 NOTE — Progress Notes (Signed)
02/04/2021 9:53 AM   Ronald Montgomery [birth year] [id number]  Referring provider: Doreen Beam, FNP [address],  [place] [address]  Chief Complaint  Patient presents with   Dysuria    HPI: Ronald Montgomery is a 40 y.o. male who presents for evaluation of lower urinary tract symptoms and pelvic pain.  1 month history of bothersome lower urinary tract symptoms including urinary hesitancy, decreased stream and postvoid dribbling Complains of dull aching head of the penis, perineal pain with sensation of rectal fullness, pain with ejaculation Denies dysuria, gross hematuria Seen in ED 12/29/2020.  Negative urinalysis, urine culture and STI testing Scrotal sonogram with normal-appearing testes and small, bilateral spermatoceles He is uncircumcised and ran out of metformin and has noted some redness and itching of his glans which improved after applying Monistat Was seen here in 2016-2018 for intermittent penile/scrotal pain which subsequently resolved.  He thinks his current symptoms are similar but is not certain   PMH: Past Medical History:  Diagnosis Date   Alcohol abuse    Asthma    Chlamydia    Constipation    Diabetes mellitus without complication ([place])    Difficult intubation    Penile pain 02/03/2015   Normal exams x several    Scabies    Testicular/scrotal pain 02/03/2015   Normal exam and [place] x several.     Thyroid disorder    during childhood    Surgical History: Past Surgical History:  Procedure Laterality Date   ABCESS DRAINAGE     groin area   CYST EXCISION     top of head   DIRECT LARYNGOSCOPY  07/07/2015   Procedure: DIRECT LARYNGOSCOPY;  Surgeon: Beverly Gust, MD;  Location: [facility] ORS;  Service: ENT;;   INCISION AND DRAINAGE ABSCESS N/A 07/07/2015   Procedure: INCISION AND DRAINAGE ABSCESS;  Surgeon: Beverly Gust, MD;  Location: [facility] OR[facility];  Service: ENT;  Laterality: N/A;  epiglottic abscess   RHYTIDECTOMY NECK / CHEEK / CHIN      due to accident   TONSILLECTOMY Bilateral 06/13/2017   Procedure: TONSILLECTOMY;  Surgeon: Beverly Gust, MD;  Location: [facility];  Service: ENT;  Laterality: Bilateral;    Home Medications:  Allergies as of 02/04/2021   No Known Allergies      Medication List        Accurate as of February 04, 2021  9:53 AM. If you have any questions, ask your nurse or doctor.          blood glucose meter kit and supplies Dispense based on patient and insurance preference. Use up to four times daily as directed. (FOR ICD-10 E10.9, E11.9).   clotrimazole 10 MG troche Commonly known as: MYCELEX Take 1 tablet (10 mg total) by mouth 5 (five) times daily.   metFORMIN 500 MG tablet Commonly known as: GLUCOPHAGE Take 2 tablets in the morning (1,034m ) by mouth and 1 tablet in the evening 500 mg Please schedule an office visit before anymore refills.        Allergies: No Known Allergies  Family History: Family History  Problem Relation Age of Onset   Skin cancer Mother    Diabetes Mother    Thyroid cancer Maternal Grandfather    Heart attack Maternal Grandfather    Diabetes Maternal Grandfather     Social History:  reports that he has been smoking cigarettes. He has been smoking an average of .75 packs per day. He has never used smokeless tobacco. He  reports that he does not currently use alcohol. He reports that he does not currently use drugs after having used the following drugs: Marijuana.   Physical Exam: BP 137/86   Pulse 81   Ht _0  (1.676 m)   Wt 251 lb 12.8 oz (114.2 kg)   BMI 40.64 kg/m   Constitutional:  Alert and oriented, No acute distress. HEENT: [place] AT, moist mucus membranes.  Trachea midline, no masses. Cardiovascular: No clubbing, cyanosis, or edema. Respiratory: Normal respiratory effort, no increased work of breathing. GI: Abdomen is soft, nontender, nondistended, no abdominal masses GU: Phallus uncircumcised, foreskin easily retracts.  Mild erythema glans.   Testes descended bilateral without masses or tenderness.  Prostate 30 g with moderate tenderness and marked pelvic floor tenderness Skin: No rashes, bruises or suspicious lesions. Neurologic: Grossly intact, no focal deficits, moving all 4 extremities. Psychiatric: Normal mood and affect.  Laboratory Data:  Urinalysis UA today: Dipstick/microscopy negative   Pertinent Imaging: Scrotal ultrasound images personally reviewed and interpreted   Assessment & Plan:   40 y.o. male with pelvic/perineal pain and lower urinary tract symptoms consistent with chronic nonbacterial prostatitis Rx tamsulosin 0.4 mg daily Celebrex 200 mg twice daily x2 weeks PA follow-up 1 month for symptom recheck Consider cystoscopy for persistent symptoms Rx Mycolog sent for balanitis   Abbie Sons, MD  [place] [address] [place], [place] [address] [phone]

## 2021-02-04 NOTE — Telephone Encounter (Signed)
Patient calling for a refill on Metformin. Saw michelle at [facility] but has not established with our office as of yet.   Please advise

## 2021-02-05 ENCOUNTER — Other Ambulatory Visit: Payer: Self-pay | Admitting: Family

## 2021-02-05 DIAGNOSIS — E119 Type 2 diabetes mellitus without complications: Secondary | ICD-10-CM

## 2021-02-05 LAB — URINALYSIS, COMPLETE
Bilirubin, UA: NEGATIVE
Glucose, UA: NEGATIVE
Ketones, UA: NEGATIVE
Leukocytes,UA: NEGATIVE
Nitrite, UA: NEGATIVE
Protein,UA: NEGATIVE
RBC, UA: NEGATIVE
Specific Gravity, UA: 1.025 (ref 1.005–1.030)
Urobilinogen, Ur: 0.2 mg/dL (ref 0.2–1.0)
pH, UA: 5.5 (ref 5.0–7.5)

## 2021-02-05 LAB — MICROSCOPIC EXAMINATION
Bacteria, UA: NONE SEEN
RBC, Urine: NONE SEEN /hpf (ref 0–2)

## 2021-02-05 MED ORDER — METFORMIN HCL 500 MG PO TABS: ORAL_TABLET | ORAL | 0 refills | Status: DC

## 2021-02-18 ENCOUNTER — Ambulatory Visit: Payer: Self-pay | Admitting: Physician Assistant

## 2021-02-18 ENCOUNTER — Encounter: Payer: Self-pay | Admitting: Physician Assistant

## 2021-02-18 ENCOUNTER — Other Ambulatory Visit: Payer: Self-pay

## 2021-02-18 DIAGNOSIS — Z113 Encounter for screening for infections with a predominantly sexual mode of transmission: Secondary | ICD-10-CM

## 2021-02-18 LAB — GRAM STAIN

## 2021-02-18 NOTE — Progress Notes (Signed)
Pt here for STD screening.  Wet mount results reviewed, no treatment required per Provider.  Pt declined condoms. Joran Kallal M Eller Sweis, RN  

## 2021-02-18 NOTE — Progress Notes (Signed)
[facility] Department STI clinic/screening visit  Subjective:  Ronald Montgomery is a 40 y.o. male being seen today for an STI screening visit. The patient reports they do have symptoms.    Patient has the following medical conditions:   Patient Active Problem List   Diagnosis Date Noted   No-show for appointment 05/20/2020   Cervical pain 05/11/2020   Acute right-sided thoracic back pain 05/08/2020   Lymphocytosis 05/06/2020   Multiple stiff joints 05/01/2020   Diabetes mellitus without complication (HCC) 05/01/2020   Lumbar back pain 05/01/2020   Screening for blood or protein in urine 05/01/2020   Body mass index (BMI) of 40.1-44.9 in adult [facility]) 05/01/2020   Smoker 05/01/2020   Snoring 05/01/2020   Poor sleep 05/01/2020   Fatigue 05/01/2020   Paresthesia of both lower extremities 05/01/2020   Vitamin D deficiency 05/01/2020   Epiglottitis 07/07/2015   Penile pain 02/03/2015   Disorder of male genital organs 02/03/2015     Chief Complaint  Patient presents with   SEXUALLY TRANSMITTED DISEASE    Screening     HPI  Patient reports that he has noticed a clear discharge off and on for 1.5 weeks.  Denies other symptoms.  Takes medicines as prescribed for DM and other chronic conditions. States that last HIV test was in July of 2022 and last void prior to sample collection for Gram stain was about 2 hr ago.   Screening for MPX risk: Does the patient have an unexplained rash? No Is the patient MSM? No Does the patient endorse multiple sex partners or anonymous sex partners? No Did the patient have close or sexual contact with a person diagnosed with MPX? No Has the patient traveled outside the [place] where MPX is endemic? No Is there a high clinical suspicion for MPX-- evidenced by one of the following No  -Unlikely to be chickenpox  -Lymphadenopathy  -Rash that present in same phase of evolution on any given body part   See flowsheet for further details and  programmatic requirements.    The following portions of the patient's history were reviewed and updated as appropriate: allergies, current medications, past medical history, past social history, past surgical history and problem list.  Objective:  There were no vitals filed for this visit.  Physical Exam Constitutional:      General: He is not in acute distress.    Appearance: Normal appearance.  HENT:     Head: Normocephalic and atraumatic.     Comments: No nits,lice, or hair loss. No cervical, supraclavicular or axillary adenopathy.     Mouth/Throat:     Mouth: Mucous membranes are moist.     Pharynx: Oropharynx is clear. No oropharyngeal exudate or posterior oropharyngeal erythema.  Eyes:     Conjunctiva/sclera: Conjunctivae normal.  Pulmonary:     Effort: Pulmonary effort is normal.  Abdominal:     Palpations: Abdomen is soft. There is no mass.     Tenderness: There is no abdominal tenderness. There is no guarding or rebound.  Genitourinary:    Penis: Normal.      Testes: Normal.     Comments: Pubic area without nits, lice, hair loss, edema, erythema, lesions and inguinal adenopathy. Penis uncircumcised without rash, lesions and discharge at meatus. Testicles descended bilaterally,nt, no masses or edema.  Musculoskeletal:     Cervical back: Neck supple. No tenderness.  Skin:    General: Skin is warm and dry.     Findings: No bruising, erythema,  lesion or rash.  Neurological:     Mental Status: He is alert and oriented to person, place, and time.  Psychiatric:        Mood and Affect: Mood normal.        Behavior: Behavior normal.        Thought Content: Thought content normal.        Judgment: Judgment normal.      Assessment and Plan:  Ronald Montgomery is a 40 y.o. male presenting to the [facility] Department for STI screening  1. Screening for STD (sexually transmitted disease) Patient into clinic with symptoms. Reviewed Gram stain results and no  treatment is indicated at this time. Rec condoms with all sex. Await test results.  Counseled that RN will call if needs to RTC for treatment once results are back.  - Gram stain - Gonococcus culture - HIV [facility] - Syphilis Serology, [facility]     No follow-ups on file.  Future Appointments  Date Time Provider Department Center  03/08/2021  9:30 AM [place], [facility], PA-C [facility] None    Matt Holmes, [place]

## 2021-02-22 LAB — GONOCOCCUS CULTURE

## 2021-03-08 ENCOUNTER — Ambulatory Visit (INDEPENDENT_AMBULATORY_CARE_PROVIDER_SITE_OTHER): Payer: Managed Care, Other (non HMO) | Admitting: Physician Assistant

## 2021-03-08 ENCOUNTER — Encounter: Payer: Self-pay | Admitting: Physician Assistant

## 2021-03-08 ENCOUNTER — Other Ambulatory Visit: Payer: Self-pay

## 2021-03-08 VITALS — BP 137/90 | HR 77 | Ht 66.0 in | Wt 255.0 lb

## 2021-03-08 DIAGNOSIS — K529 Noninfective gastroenteritis and colitis, unspecified: Secondary | ICD-10-CM | POA: Diagnosis not present

## 2021-03-08 DIAGNOSIS — N411 Chronic prostatitis: Secondary | ICD-10-CM | POA: Diagnosis not present

## 2021-03-08 DIAGNOSIS — N529 Male erectile dysfunction, unspecified: Secondary | ICD-10-CM

## 2021-03-08 MED ORDER — TADALAFIL 5 MG PO TABS: 5.0000 mg | ORAL_TABLET | Freq: Every day | ORAL | 0 refills | Status: DC | PRN

## 2021-03-08 MED ORDER — TAMSULOSIN HCL 0.4 MG PO CAPS: 0.4000 mg | ORAL_CAPSULE | Freq: Every day | ORAL | 0 refills | Status: DC

## 2021-03-08 NOTE — Progress Notes (Signed)
03/08/2021 1:25 PM   Ronald Montgomery [birth year] [id number]  CC: Chief Complaint  Patient presents with   Follow-up    HPI: Ronald Montgomery is a 40 y.o. male with PMH diabetes, BMI 41, and chronic prostatitis who presents today for symptom recheck after having completed 1 month of Flomax and 2 weeks of Celebrex.   Today he reports his pelvic and perineal pain as well as LUTS are approximately 90% improved after completing medication as above.  He ran out of Flomax 1 week ago and has not noticed worsening symptoms off this medication.  He denies fever, chills, nausea, or vomiting.  Patient also reports an approximate 2-year history of erectile dysfunction, notably with decreased quality and duration of spontaneous erections.  On further questioning, he states this is occurred within the context of an approximate 5 to 10-year history of progressive weight gain, decreased hair growth, fatigue, and decreased libido.  He does not believe he is depressed, though he is tearful in clinic today describing his symptoms.  No history of heart failure, not on nitrates for chest pain.  Lastly, patient reports at least a 1 year history of chronic, intermittent diarrhea that is frequent and associated with abdominal pain.  He has not noticed any association between his diarrhea and food intake.  He notes abdominal pain with diarrhea.  He denies seeing blood in his stool.  He requests a GI referral today for further evaluation of this.  PMH: Past Medical History:  Diagnosis Date   Alcohol abuse    Asthma    Chlamydia    Constipation    Diabetes mellitus without complication ([place])    Difficult intubation    Penile pain 02/03/2015   Normal exams x several    Scabies    Testicular/scrotal pain 02/03/2015   Normal exam and [place] x several.     Thyroid disorder    during childhood    Surgical History: Past Surgical History:  Procedure Laterality Date   ABCESS DRAINAGE     groin area   CYST EXCISION      top of head   DIRECT LARYNGOSCOPY  07/07/2015   Procedure: DIRECT LARYNGOSCOPY;  Surgeon: Beverly Gust, MD;  Location: [facility];  Service: ENT;;   INCISION AND DRAINAGE ABSCESS N/A 07/07/2015   Procedure: INCISION AND DRAINAGE ABSCESS;  Surgeon: Beverly Gust, MD;  Location: [facility] OR[facility];  Service: ENT;  Laterality: N/A;  epiglottic abscess   RHYTIDECTOMY NECK / CHEEK / CHIN     due to accident   TONSILLECTOMY Bilateral 06/13/2017   Procedure: TONSILLECTOMY;  Surgeon: Beverly Gust, MD;  Location: [facility] OR[facility];  Service: ENT;  Laterality: Bilateral;    Home Medications:  Allergies as of 03/08/2021   No Known Allergies      Medication List        Accurate as of March 08, 2021  1:25 PM. If you have any questions, ask your nurse or doctor.          blood glucose meter kit and supplies Dispense based on patient and insurance preference. Use up to four times daily as directed. (FOR ICD-10 E10.9, E11.9).   celecoxib 200 MG capsule Commonly known as: CeleBREX Take 1 capsule (200 mg total) by mouth 2 (two) times daily.   clotrimazole 10 MG troche Commonly known as: MYCELEX Take 1 tablet (10 mg total) by mouth 5 (five) times daily.   metFORMIN 500 MG tablet Commonly known as: GLUCOPHAGE Take 2 tablets in the morning (  1,045m ) by mouth and 1 tablet in the evening 500 mg Please schedule an office visit before anymore refills.   tadalafil 5 MG tablet Commonly known as: CIALIS Take 1 tablet (5 mg total) by mouth daily as needed for erectile dysfunction. Started by: SDebroah Loop PA-C   tamsulosin 0.4 MG Caps capsule Commonly known as: FLOMAX Take 1 capsule (0.4 mg total) by mouth daily. What changed: when to take this Changed by: SDebroah Loop PA-C        Allergies:  No Known Allergies  Family History: Family History  Problem Relation Age of Onset   Skin cancer Mother    Diabetes Mother    Thyroid cancer Maternal Grandfather    Heart attack  Maternal Grandfather    Diabetes Maternal Grandfather     Social History:   reports that he has been smoking cigarettes. He has been smoking an average of .75 packs per day. He has never used smokeless tobacco. He reports that he does not currently use alcohol. He reports that he does not currently use drugs after having used the following drugs: Marijuana.  Physical Exam: BP 137/90   Pulse 77   Ht 5' 6"  (1.676 m)   Wt 255 lb (115.7 kg)   BMI 41.16 kg/m   Constitutional:  Alert and oriented, no acute distress, nontoxic appearing HEENT: [place], AT Cardiovascular: No clubbing, cyanosis, or edema Respiratory: Normal respiratory effort, no increased work of breathing Skin: No rashes, bruises or suspicious lesions Neurologic: Grossly intact, no focal deficits, moving all 4 extremities Psychiatric: Normal mood and affect, occasionally tearful  Assessment & Plan:   1. Chronic prostatitis Symptoms nearly resolved on Flomax and Celebrex.  We will continue Flomax for an additional month to seek symptom resolution.  We discussed that chronic prostatitis is characterized by intermittent flares of symptoms and that he should seek care in our office when these return so we can treat him symptomatically.  He expressed understanding. - tamsulosin (FLOMAX) 0.4 MG CAPS capsule; Take 1 capsule (0.4 mg total) by mouth daily.  Dispense: 30 capsule; Refill: 0  2. Erectile dysfunction, unspecified erectile dysfunction type Approximate 2-year history of decreased erection quality and duration in the setting of a longer prodrome of increased weight, fatigue, decreased libido, and changes in hair growth.  I suspect he may have hypogonadism and will plan for a.m. testosterone at his next clinic visit.  In the meantime, I would like to start him on daily low-dose tadalafil and will plan for shim in 1 month. - tadalafil (CIALIS) 5 MG tablet; Take 1 tablet (5 mg total) by mouth daily as needed for erectile dysfunction.   Dispense: 30 tablet; Refill: 0 - Testosterone; Future  3. Chronic diarrhea Intermittent but frequent, associated with abdominal pain, no melena or hematochezia, no observable association to food intake.  Will refer to GI per patient request. - Ambulatory referral to Gastroenterology  Return in about 4 weeks (around 04/05/2021) for Symptom recheck and AM testosterone.  SDebroah Loop PA-C  [facility] [address] [place] [phone]

## 2021-03-30 ENCOUNTER — Encounter: Payer: Self-pay | Admitting: Gastroenterology

## 2021-03-30 ENCOUNTER — Ambulatory Visit (INDEPENDENT_AMBULATORY_CARE_PROVIDER_SITE_OTHER): Payer: PRIVATE HEALTH INSURANCE | Admitting: Gastroenterology

## 2021-03-30 ENCOUNTER — Other Ambulatory Visit: Payer: Self-pay

## 2021-03-30 VITALS — BP 137/92 | HR 89 | Temp 98.4°F | Ht 66.0 in | Wt 258.8 lb

## 2021-03-30 DIAGNOSIS — K219 Gastro-esophageal reflux disease without esophagitis: Secondary | ICD-10-CM

## 2021-03-30 DIAGNOSIS — R195 Other fecal abnormalities: Secondary | ICD-10-CM

## 2021-03-30 DIAGNOSIS — R7989 Other specified abnormal findings of blood chemistry: Secondary | ICD-10-CM | POA: Diagnosis not present

## 2021-03-30 DIAGNOSIS — R1013 Epigastric pain: Secondary | ICD-10-CM

## 2021-03-30 MED ORDER — OMEPRAZOLE 40 MG PO CPDR: 40.0000 mg | DELAYED_RELEASE_CAPSULE | Freq: Two times a day (BID) | ORAL | 2 refills | Status: DC

## 2021-03-30 MED ORDER — OMEPRAZOLE 20 MG PO CPDR: 40.0000 mg | DELAYED_RELEASE_CAPSULE | Freq: Two times a day (BID) | ORAL | 2 refills | Status: DC

## 2021-03-30 NOTE — Progress Notes (Signed)
Ronald Darby, MD [address]  [place]  [place], [place] [address]  Main: [phone]  Fax: [phone]    Gastroenterology Consultation  Referring Provider:     Debroah Loop,* Primary Care Physician:  Doreen Beam, FNP Primary Gastroenterologist:  Dr. Cephas Montgomery Reason for Consultation:     Abdominal pain, gas, bloating, loose stools, reflux        HPI:   Ronald Montgomery is a 40 y.o. male referred by Dr. Doreen Beam, FNP  for consultation & management of more than 1 year history of heartburn, frequent burping, regurgitation associated with upper abdominal discomfort, abdominal bloating and loose stools.  Patient reports that on average, he has 2-3 loose bowel movements daily, most of the days in a week.  He does have formed bowel movements intermittently.  Patient reports an episode of bright red blood per rectum on wiping several months ago.  For heartburn, patient reported that he tried over-the-counter omeprazole, Tums, Rolaids which provided temporary relief only.  Patient generally has 2 meals a day, works in a TEFL teacher, has shift work and does report irregular sleep cycles because of his job.  He has history of obesity, has significantly gained weight, currently trying to lose weight, and his weight has been slowly downtrending.  He is diagnosed with fatty liver based on imaging for work-up of elevated LFTs. Patient drinks a can of soda daily, denies any other sugary drinks.  He does have hamburger every 2 to 3 days.  He does smoke cigarettes.  He drinks alcohol occasionally.  He reports that he used to drink heavily in the past.  Patient has history of diabetes, poorly controlled, on metformin for about a year.  He does report that his GI symptoms started before initiation of metformin  Review of labs revealed intermittent mild leukocytosis, no evidence of anemia, normal renal function, AST 70, ALT 121, T bili 0.5, alkaline  phosphatase 74, viral hepatitis panel negative  NSAIDs: None  Antiplts/Anticoagulants/Anti thrombotics: None  GI Procedures: None He denies any known family history of GI malignancy, liver disease  Past Medical History:  Diagnosis Date   Alcohol abuse    Asthma    Chlamydia    Constipation    Diabetes mellitus without complication ([place])    Difficult intubation    Penile pain 02/03/2015   Normal exams x several    Scabies    Testicular/scrotal pain 02/03/2015   Normal exam and [place] x several.     Thyroid disorder    during childhood    Past Surgical History:  Procedure Laterality Date   ABCESS DRAINAGE     groin area   CYST EXCISION     top of head   DIRECT LARYNGOSCOPY  07/07/2015   Procedure: DIRECT LARYNGOSCOPY;  Surgeon: Beverly Gust, MD;  Location: [facility] OR[facility];  Service: ENT;;   INCISION AND DRAINAGE ABSCESS N/A 07/07/2015   Procedure: INCISION AND DRAINAGE ABSCESS;  Surgeon: Beverly Gust, MD;  Location: [facility];  Service: ENT;  Laterality: N/A;  epiglottic abscess   RHYTIDECTOMY NECK / CHEEK / CHIN     due to accident   TONSILLECTOMY Bilateral 06/13/2017   Procedure: TONSILLECTOMY;  Surgeon: Beverly Gust, MD;  Location: [facility];  Service: ENT;  Laterality: Bilateral;    Current Outpatient Medications:    blood glucose meter kit and supplies, Dispense based on patient and insurance preference. Use up to four times daily as directed. (FOR ICD-10  E10.9, E11.9)., Disp: 1 each, Rfl: 0   metFORMIN (GLUCOPHAGE) 500 MG tablet, Take 2 tablets in the morning (1,052m ) by mouth and 1 tablet in the evening 500 mg Please schedule an office visit before anymore refills., Disp: 90 tablet, Rfl: 0   tadalafil (CIALIS) 5 MG tablet, Take 1 tablet (5 mg total) by mouth daily as needed for erectile dysfunction., Disp: 30 tablet, Rfl: 0   tamsulosin (FLOMAX) 0.4 MG CAPS capsule, Take 1 capsule (0.4 mg total) by mouth daily., Disp: 30 capsule, Rfl: 0   celecoxib (CELEBREX) 200 MG capsule,  Take 1 capsule (200 mg total) by mouth 2 (two) times daily. (Patient not taking: No sig reported), Disp: 30 capsule, Rfl: 0   clotrimazole (MYCELEX) 10 MG troche, Take 1 tablet (10 mg total) by mouth 5 (five) times daily. (Patient not taking: No sig reported), Disp: 35 tablet, Rfl: 0   omeprazole (PRILOSEC) 40 MG capsule, Take 1 capsule (40 mg total) by mouth 2 (two) times daily before a meal., Disp: 60 capsule, Rfl: 2  Family History  Problem Relation Age of Onset   Skin cancer Mother    Diabetes Mother    Thyroid cancer Maternal Grandfather    Heart attack Maternal Grandfather    Diabetes Maternal Grandfather      Social History   Tobacco Use   Smoking status: Every Day    Packs/day: 0.75    Types: Cigarettes   Smokeless tobacco: Never  Vaping Use   Vaping Use: Never used  Substance Use Topics   Alcohol use: Not Currently    Comment: patient reports quit drinking in 09/2020.   Drug use: Not Currently    Types: Marijuana    Comment: per patient last use was in early 20s.    Allergies as of 03/30/2021   (No Known Allergies)    Review of Systems:    All systems reviewed and negative except where noted in HPI.   Physical Exam:  BP (!) 137/92 (BP Location: Left Arm, Patient Position: Sitting, Cuff Size: Large)   Pulse 89   Temp 98.4 F (36.9 C) (Oral)   Ht 5' 6" (1.676 m)   Wt 258 lb 12.8 oz (117.4 kg)   BMI 41.77 kg/m  No LMP for male patient.  General:   Alert,  Well-developed, well-nourished, pleasant and cooperative in NAD Head:  Normocephalic and atraumatic. Eyes:  Sclera clear, no icterus.   Conjunctiva pink. Ears:  Normal auditory acuity. Nose:  No deformity, discharge, or lesions. Mouth:  No deformity or lesions,oropharynx pink & moist. Neck:  Supple; no masses or thyromegaly. Lungs:  Respirations even and unlabored.  Clear throughout to auscultation.   No wheezes, crackles, or rhonchi. No acute distress. Heart:  Regular rate and rhythm; no murmurs, clicks,  rubs, or gallops. Abdomen:  Normal bowel sounds. Soft, obese, non-tender and mildly distended without masses, hepatosplenomegaly or hernias noted.  No guarding or rebound tenderness.   Rectal: Not performed Msk:  Symmetrical without gross deformities. Good, equal movement & strength bilaterally. Pulses:  Normal pulses noted. Extremities:  No clubbing or edema.  No cyanosis. Neurologic:  Alert and oriented x3;  grossly normal neurologically. Skin:  Intact without significant lesions or rashes. No jaundice. Psych:  Alert and cooperative. Normal mood and affect.  Imaging Studies: Reviewed  Assessment and Plan:   DRIGOBERTO REPASSis a 40y.o. Caucasian male with metabolic syndrome, BMI 471.0 poorly controlled diabetes, hemoglobin A1c 8.9 currently maintained on metformin is  seen in consultation for more than 1 year history of worsening symptoms of reflux, abdominal bloating, loose stools, upper abdominal discomfort  GERD: Discussed about antireflux lifestyle, information provided Start omeprazole 40 mg p.o. twice daily before meals Recommend EGD if symptoms are persistent despite being on optimal dose of omeprazole for 1 month  Abdominal bloating, loose stools and abdominal discomfort Check GI profile PCR, calprotectin levels and pancreatic fecal elastase levels Recommend colonoscopy if above work-up is unremarkable and symptoms are persistent IBgard samples provided Trial of lactose-free diet, information provided Discussed about healthy diet and exercise  Elevated transaminases, likely secondary to fatty liver in setting of metabolic syndrome Viral hepatitis A, B, C, HIV negative Recommend rest of the secondary liver disease work-up Discussed about lifestyle modification, information provided Reiterated on tight control of diabetes Recheck LFTs every 3 to 6 months   Follow up in 4 months   Ronald Darby, MD

## 2021-03-30 NOTE — Patient Instructions (Addendum)
Heart-Healthy Eating Plan Heart-healthy meal planning includes: Eating less unhealthy fats. Eating more healthy fats. Making other changes in your diet. Talk with your doctor or a diet specialist (dietitian) to create an eating plan that is right for you. What is my plan? Your doctor may recommend an eating plan that includes: Total fat: ______% or less of total calories a day. Saturated fat: ______% or less of total calories a day. Cholesterol: less than _________mg a day. What are tips for following this plan? Cooking Avoid frying your food. Try to bake, boil, grill, or broil it instead. You can also reduce fat by: Removing the skin from poultry. Removing all visible fats from meats. Steaming vegetables in water or broth. Meal planning  At meals, divide your plate into four equal parts: Fill one-half of your plate with vegetables and green salads. Fill one-fourth of your plate with whole grains. Fill one-fourth of your plate with lean protein foods. Eat 4-5 servings of vegetables per day. A serving of vegetables is: 1 cup of raw or cooked vegetables. 2 cups of raw leafy greens. Eat 4-5 servings of fruit per day. A serving of fruit is: 1 medium whole fruit.  cup of dried fruit.  cup of fresh, frozen, or canned fruit.  cup of 100% fruit juice. Eat more foods that have soluble fiber. These are apples, broccoli, carrots, beans, peas, and barley. Try to get 20-30 g of fiber per day. Eat 4-5 servings of nuts, legumes, and seeds per week: 1 serving of dried beans or legumes equals  cup after being cooked. 1 serving of nuts is  cup. 1 serving of seeds equals 1 tablespoon. General information Eat more home-cooked food. Eat less restaurant, buffet, and fast food. Limit or avoid alcohol. Limit foods that are high in starch and sugar. Avoid fried foods. Lose weight if you are overweight. Keep track of how much salt (sodium) you eat. This is important if you have high blood  pressure. Ask your doctor to tell you more about this. Try to add vegetarian meals each week. Fats Choose healthy fats. These include olive oil and canola oil, flaxseeds, walnuts, almonds, and seeds. Eat more omega-3 fats. These include salmon, mackerel, sardines, tuna, flaxseed oil, and ground flaxseeds. Try to eat fish at least 2 times each week. Check food labels. Avoid foods with trans fats or high amounts of saturated fat. Limit saturated fats. These are often found in animal products, such as meats, butter, and cream. These are also found in plant foods, such as palm oil, palm kernel oil, and coconut oil. Avoid foods with partially hydrogenated oils in them. These have trans fats. Examples are stick margarine, some tub margarines, cookies, crackers, and other baked goods. What foods can I eat? Fruits All fresh, canned (in natural juice), or frozen fruits. Vegetables Fresh or frozen vegetables (raw, steamed, roasted, or grilled). Green salads. Grains Most grains. Choose whole wheat and whole grains most of the time. Rice and pasta, including brown rice and pastas made with whole wheat. Meats and other proteins Lean, well-trimmed beef, veal, pork, and lamb. Chicken and turkey without skin. All fish and shellfish. Wild duck, rabbit, pheasant, and venison. Egg whites or low-cholesterol egg substitutes. Dried beans, peas, lentils, and tofu. Seeds and most nuts. Dairy Low-fat or nonfat cheeses, including ricotta and mozzarella. Skim or 1% milk that is liquid, powdered, or evaporated. Buttermilk that is made with low-fat milk. Nonfat or low-fat yogurt. Fats and oils Non-hydrogenated (trans-free) margarines. Vegetable oils, including   soybean, sesame, sunflower, olive, peanut, safflower, corn, canola, and cottonseed. Salad dressings or mayonnaise made with a vegetable oil. Beverages Mineral water. Coffee and tea. Diet carbonated beverages. Sweets and desserts Sherbet, gelatin, and fruit ice.  Small amounts of dark chocolate. Limit all sweets and desserts. Seasonings and condiments All seasonings and condiments. The items listed above may not be a complete list of foods and drinks you can eat. Contact a dietitian for more options. What foods should I avoid? Fruits Canned fruit in heavy syrup. Fruit in cream or butter sauce. Fried fruit. Limit coconut. Vegetables Vegetables cooked in cheese, cream, or butter sauce. Fried vegetables. Grains Breads that are made with saturated or trans fats, oils, or whole milk. Croissants. Sweet rolls. Donuts. High-fat crackers, such as cheese crackers. Meats and other proteins Fatty meats, such as hot dogs, ribs, sausage, bacon, rib-eye roast or steak. High-fat deli meats, such as salami and bologna. Caviar. Domestic duck and goose. Organ meats, such as liver. Dairy Cream, sour cream, cream cheese, and creamed cottage cheese. Whole-milk cheeses. Whole or 2% milk that is liquid, evaporated, or condensed. Whole buttermilk. Cream sauce or high-fat cheese sauce. Yogurt that is made from whole milk. Fats and oils Meat fat, or shortening. Cocoa butter, hydrogenated oils, palm oil, coconut oil, palm kernel oil. Solid fats and shortenings, including bacon fat, salt pork, lard, and butter. Nondairy cream substitutes. Salad dressings with cheese or sour cream. Beverages Regular sodas and juice drinks with added sugar. Sweets and desserts Frosting. Pudding. Cookies. Cakes. Pies. Milk chocolate or white chocolate. Buttered syrups. Full-fat ice cream or ice cream drinks. The items listed above may not be a complete list of foods and drinks to avoid. Contact a dietitian for more information. Summary Heart-healthy meal planning includes eating less unhealthy fats, eating more healthy fats, and making other changes in your diet. Eat a balanced diet. This includes fruits and vegetables, low-fat or nonfat dairy, lean protein, nuts and legumes, whole grains, and  heart-healthy oils and fats. This information is not intended to replace advice given to you by your health care provider. Make sure you discuss any questions you have with your health care provider. Document Revised: 09/24/2020 Document Reviewed: 09/24/2020 Elsevier Patient Education  2022 [employer]  Lactose-Free Diet, Adult If you have lactose intolerance, you are not able to digest lactose. Lactose is a natural sugar found mainly in dairy milk and dairy products. A lactose-free diet can help you avoid foods and beverages that contain lactose. What are tips for following this plan? Reading food labels Do not consume foods, beverages, vitamins, minerals, or medicines containing lactose. Read ingredient lists carefully. Look for the words "lactose-free" on labels. Meal planning Use alternatives to dairy milk and foods made with milk products. These include the following: Lactose-free milk. Soy milk with added calcium and vitamin D. Almond milk, coconut milk, rice milk, or other nondairy milk alternatives with added calcium and vitamin D. Note that a lot of these are low in protein. Soy products, such as soy yogurt, soy cheese, soy ice cream, and soy-based sour cream. Other nut milk products, such as almond yogurt, almond cheese, cashew yogurt, cashew cheese, cashew ice cream, coconut yogurt, and coconut ice cream. Medicines, vitamins, and supplements Use lactase enzyme drops or tablets as directed by your health care provider. Make sure you get enough calcium and vitamin D in your diet. A lactose-free eating plan can be lacking in these important nutrients. Take calcium and vitamin D supplements as directed  by your health care provider. Talk with your health care provider about supplements if you are not able to get enough calcium and vitamin D from food. What foods should I eat? Fruits All fresh, canned, frozen, or dried fruits and fruit juices that are not processed with  lactose. Vegetables All fresh, frozen, and canned vegetables without cheese, cream, or butter sauces. Grains Any that are not made with dairy milk or dairy products. Meats and other proteins Any meat, fish, poultry, and other protein sources that are not made with dairy milk or dairy products. Fats and oils Any that are not made with dairy milk or dairy products. Sweets and desserts Any that are not made with dairy milk or dairy products. Seasonings and condiments Any that are not made with dairy milk or dairy products. Calcium Calcium is found in many foods that contain lactose and is important for bone health. The amount of calcium you need depends on your age: Adults younger than 50 years: 1,000 mg of calcium a day. Adults older than 50 years: 1,200 mg of calcium a day. If you are not getting enough calcium, you may get it from other sources, including: Orange juice that has been fortified with calcium. This means that calcium has been added to the product. There are 300-350 mg of calcium in 1 cup (237 mL) of calcium-fortified orange juice. Soy milk fortified with calcium. There are 300-400 mg of calcium in 1 cup (237 mL) of calcium-fortified soy milk. Rice or almond milk fortified with calcium. There are 300 mg of calcium in 1 cup (237 mL) of calcium-fortified rice or almond milk. Breakfast cereals fortified with calcium. There are 100-1,000 mg of calcium in calcium-fortified breakfast cereals. Spinach, cooked. There are 145 mg of calcium in  cup (90 g) of cooked spinach. Edamame, cooked. There are 130 mg of calcium in  cup (47 g) of cooked edamame. Collard greens, cooked. There are 125 mg of calcium in  cup (85 g) of cooked collard greens. Kale, frozen or cooked. There are 90 mg of calcium in  cup (59 g) of cooked or frozen kale. Almonds. There are 95 mg of calcium in  cup (35 g) of almonds. Broccoli, cooked. There are 60 mg of calcium in 1 cup (156 g) of cooked broccoli. The  items listed above may not be a complete list of foods and beverages you can eat. Contact a dietitian for more options. What foods should I avoid? Lactose is found in dairy milk and dairy products, such as: Yogurt. Cheese. Butter. Margarine. Sour cream. Cream. Whipped toppings and creamers. Ice cream and other dairy-based desserts. Lactose is also found in foods or products made with dairy milk or milk ingredients. To find out whether a food contains dairy milk or a milk ingredient, look at the ingredients list. Avoid foods with the statement "May contain milk" and foods that contain: Milk powder. Whey. Curd. Lactose. Lactoglobulin. The items listed above may not be a complete list of foods and beverages to avoid. Contact a dietitian for more information. Where to find more information [employer] and [employer]estive [employer] Kidney Diseases: [email or web address] Summary If you are lactose intolerant, it means that you are not able to digest lactose, a natural sugar found in milk and milk products. Following a lactose-free diet can help you manage this condition. Calcium is important for bone health and is found in many foods that contain lactose. Talk with your health care provider about other sources of calcium.  This information is not intended to replace advice given to you by your health care provider. Make sure you discuss any questions you have with your health care provider. Document Revised: 04/21/2020 Document Reviewed: 04/21/2020 Elsevier Patient Education  2022 [employer] Fatty Liver Disease The liver converts food into energy, removes toxic material from the blood, makes important proteins, and absorbs necessary vitamins from food. Fatty liver disease occurs when too much fat has built up in your liver cells. Fatty liver disease is also called hepatic steatosis. In many cases, fatty liver disease does not cause symptoms or problems. It is often diagnosed when tests  are being done for other reasons. However, over time, fatty liver can cause inflammation that may lead to more serious liver problems, such as scarring of the liver (cirrhosis) and liver failure. Fatty liver is associated with insulin resistance, increased body fat, high blood pressure (hypertension), and high cholesterol. These are features of metabolic syndrome and increase your risk for stroke, diabetes, and heart disease. What are the causes? This condition may be caused by components of metabolic syndrome: Obesity. Insulin resistance. High cholesterol. Other causes: Alcohol abuse. Poor nutrition. Cushing syndrome. Pregnancy. Certain drugs. Poisons. Some viral infections. What increases the risk? You are more likely to develop this condition if you: Abuse alcohol. Are overweight. Have diabetes. Have hepatitis. Have a high triglyceride level. Are pregnant. What are the signs or symptoms? Fatty liver disease often does not cause symptoms. If symptoms do develop, they can include: Fatigue and weakness. Weight loss. Confusion. Nausea, vomiting, or abdominal pain. Yellowing of your skin and the white parts of your eyes (jaundice). Itchy skin. How is this diagnosed? This condition may be diagnosed by: A physical exam and your medical history. Blood tests. Imaging tests, such as an ultrasound, CT scan, or MRI. A liver biopsy. A small sample of liver tissue is removed using a needle. The sample is then looked at under a microscope. How is this treated? Fatty liver disease is often caused by other health conditions. Treatment for fatty liver may involve medicines and lifestyle changes to manage conditions such as: Alcoholism. High cholesterol. Diabetes. Being overweight or obese. Follow these instructions at home:  Do not drink alcohol. If you have trouble quitting, ask your health care provider how to safely quit with the help of medicine or a supervised program. This is  important to keep your condition from getting worse. Eat a healthy diet as told by your health care provider. Ask your health care provider about working with a dietitian to develop an eating plan. Exercise regularly. This can help you lose weight and control your cholesterol and diabetes. Talk to your health care provider about an exercise plan and which activities are best for you. Take over-the-counter and prescription medicines only as told by your health care provider. Keep all follow-up visits. This is important. Contact a health care provider if: You have trouble controlling your: Blood sugar. This is especially important if you have diabetes. Cholesterol. Drinking of alcohol. Get help right away if: You have abdominal pain. You have jaundice. You have nausea and are vomiting. You vomit blood or material that looks like coffee grounds. You have stools that are black, tar-like, or bloody. Summary Fatty liver disease develops when too much fat builds up in the cells of your liver. Fatty liver disease often causes no symptoms or problems. However, over time, fatty liver can cause inflammation that may lead to more serious liver problems, such as scarring of  the liver (cirrhosis). You are more likely to develop this condition if you abuse alcohol, are pregnant, are overweight, have diabetes, have hepatitis, or have high triglyceride or cholesterol levels. Contact your health care provider if you have trouble controlling your blood sugar, cholesterol, or drinking of alcohol. This information is not intended to replace advice given to you by your health care provider. Make sure you discuss any questions you have with your health care provider. Document Revised: 02/27/2020 Document Reviewed: 02/27/2020 Elsevier Patient Education  2022 [employer] Gastroesophageal Reflux Disease, Adult Gastroesophageal reflux (GER) happens when acid from the stomach flows up into the tube that connects the  mouth and the stomach (esophagus). Normally, food travels down the esophagus and stays in the stomach to be digested. With GER, food and stomach acid sometimes move back up into the esophagus. You may have a disease called gastroesophageal reflux disease (GERD) if the reflux: Happens often. Causes frequent or very bad symptoms. Causes problems such as damage to the esophagus. When this happens, the esophagus becomes sore and swollen. Over time, GERD can make small holes (ulcers) in the lining of the esophagus. What are the causes? This condition is caused by a problem with the muscle between the esophagus and the stomach. When this muscle is weak or not normal, it does not close properly to keep food and acid from coming back up from the stomach. The muscle can be weak because of: Tobacco use. Pregnancy. Having a certain type of hernia (hiatal hernia). Alcohol use. Certain foods and drinks, such as coffee, chocolate, onions, and peppermint. What increases the risk? Being overweight. Having a disease that affects your connective tissue. Taking NSAIDs, such a ibuprofen. What are the signs or symptoms? Heartburn. Difficult or painful swallowing. The feeling of having a lump in the throat. A bitter taste in the mouth. Bad breath. Having a lot of saliva. Having an upset or bloated stomach. Burping. Chest pain. Different conditions can cause chest pain. Make sure you see your doctor if you have chest pain. Shortness of breath or wheezing. A long-term cough or a cough at night. Wearing away of the surface of teeth (tooth enamel). Weight loss. How is this treated? Making changes to your diet. Taking medicine. Having surgery. Treatment will depend on how bad your symptoms are. Follow these instructions at home: Eating and drinking  Follow a diet as told by your doctor. You may need to avoid foods and drinks such as: Coffee and tea, with or without caffeine. Drinks that contain  alcohol. Energy drinks and sports drinks. Bubbly (carbonated) drinks or sodas. Chocolate and cocoa. Peppermint and mint flavorings. Garlic and onions. Horseradish. Spicy and acidic foods. These include peppers, chili powder, curry powder, vinegar, hot sauces, and BBQ sauce. Citrus fruit juices and citrus fruits, such as oranges, lemons, and limes. Tomato-based foods. These include red sauce, chili, salsa, and pizza with red sauce. Fried and fatty foods. These include donuts, french fries, potato chips, and high-fat dressings. High-fat meats. These include hot dogs, rib eye steak, sausage, ham, and bacon. High-fat dairy items, such as whole milk, butter, and cream cheese. Eat small meals often. Avoid eating large meals. Avoid drinking large amounts of liquid with your meals. Avoid eating meals during the 2-3 hours before bedtime. Avoid lying down right after you eat. Do not exercise right after you eat. Lifestyle  Do not smoke or use any products that contain nicotine or tobacco. If you need help quitting, ask your doctor. Try to  lower your stress. If you need help doing this, ask your doctor. If you are overweight, lose an amount of weight that is healthy for you. Ask your doctor about a safe weight loss goal. General instructions Pay attention to any changes in your symptoms. Take over-the-counter and prescription medicines only as told by your doctor. Do not take aspirin, ibuprofen, or other NSAIDs unless your doctor says it is okay. Wear loose clothes. Do not wear anything tight around your waist. Raise (elevate) the head of your bed about 6 inches (15 cm). You may need to use a wedge to do this. Avoid bending over if this makes your symptoms worse. Keep all follow-up visits. Contact a doctor if: You have new symptoms. You lose weight and you do not know why. You have trouble swallowing or it hurts to swallow. You have wheezing or a cough that keeps happening. You have a hoarse  voice. Your symptoms do not get better with treatment. Get help right away if: You have sudden pain in your arms, neck, jaw, teeth, or back. You suddenly feel sweaty, dizzy, or light-headed. You have chest pain or shortness of breath. You vomit and the vomit is green, yellow, or black, or it looks like blood or coffee grounds. You faint. Your poop (stool) is red, bloody, or black. You cannot swallow, drink, or eat. These symptoms may represent a serious problem that is an emergency. Do not wait to see if the symptoms will go away. Get medical help right away. Call your local emergency services (911 in the [place] Do not drive yourself to the hospital. Summary If a person has gastroesophageal reflux disease (GERD), food and stomach acid move back up into the esophagus and cause symptoms or problems such as damage to the esophagus. Treatment will depend on how bad your symptoms are. Follow a diet as told by your doctor. Take all medicines only as told by your doctor. This information is not intended to replace advice given to you by your health care provider. Make sure you discuss any questions you have with your health care provider. Document Revised: 11/25/2019 Document Reviewed: 11/25/2019 Elsevier Patient Education  2022 [employer].

## 2021-04-01 LAB — IRON,TIBC AND FERRITIN PANEL
Ferritin: 487 ng/mL — ABNORMAL HIGH (ref 30–400)
Iron Saturation: 13 % — ABNORMAL LOW (ref 15–55)
Iron: 45 ug/dL (ref 38–169)
Total Iron Binding Capacity: 336 ug/dL (ref 250–450)
UIBC: 291 ug/dL (ref 111–343)

## 2021-04-01 LAB — MITOCHONDRIAL ANTIBODIES: Mitochondrial Ab: <20 Units (ref 0.0–20.0)

## 2021-04-01 LAB — HEPATIC FUNCTION PANEL
ALT: 60 IU/L — ABNORMAL HIGH (ref 0–44)
AST: 36 IU/L (ref 0–40)
Albumin: 4.9 g/dL (ref 4.0–5.0)
Alkaline Phosphatase: 77 IU/L (ref 44–121)
Bilirubin Total: 0.3 mg/dL (ref 0.0–1.2)
Bilirubin, Direct: <0.1 mg/dL (ref 0.00–0.40)
Total Protein: 7.7 g/dL (ref 6.0–8.5)

## 2021-04-01 LAB — CERULOPLASMIN: Ceruloplasmin: 30.2 mg/dL (ref 16.0–31.0)

## 2021-04-01 LAB — ANTI-MICROSOMAL ANTIBODY LIVER / KIDNEY: LKM1 Ab: 0.9 Units (ref 0.0–20.0)

## 2021-04-01 LAB — CELIAC DISEASE PANEL
Endomysial IgA: NEGATIVE
IgA/Immunoglobulin A, Serum: 409 mg/dL — ABNORMAL HIGH (ref 90–386)
Transglutaminase IgA: <2 U/mL (ref 0–3)

## 2021-04-01 LAB — ALPHA-1-ANTITRYPSIN: A-1 Antitrypsin: 163 mg/dL (ref 101–187)

## 2021-04-01 LAB — ANTI-SMOOTH MUSCLE ANTIBODY, IGG: Smooth Muscle Ab: 14 Units (ref 0–19)

## 2021-04-05 ENCOUNTER — Encounter: Payer: Self-pay | Admitting: Physician Assistant

## 2021-04-05 ENCOUNTER — Ambulatory Visit (INDEPENDENT_AMBULATORY_CARE_PROVIDER_SITE_OTHER): Payer: Managed Care, Other (non HMO) | Admitting: Physician Assistant

## 2021-04-05 ENCOUNTER — Other Ambulatory Visit: Payer: Self-pay

## 2021-04-05 VITALS — BP 142/88 | HR 78 | Ht 66.0 in | Wt 257.0 lb

## 2021-04-05 DIAGNOSIS — E291 Testicular hypofunction: Secondary | ICD-10-CM

## 2021-04-05 DIAGNOSIS — N529 Male erectile dysfunction, unspecified: Secondary | ICD-10-CM

## 2021-04-05 DIAGNOSIS — R102 Pelvic and perineal pain: Secondary | ICD-10-CM

## 2021-04-05 DIAGNOSIS — M5431 Sciatica, right side: Secondary | ICD-10-CM | POA: Diagnosis not present

## 2021-04-05 DIAGNOSIS — M5432 Sciatica, left side: Secondary | ICD-10-CM

## 2021-04-05 MED ORDER — TADALAFIL 5 MG PO TABS: 5.0000 mg | ORAL_TABLET | Freq: Every day | ORAL | 2 refills | Status: DC | PRN

## 2021-04-05 NOTE — Patient Instructions (Addendum)
I will call you with your testosterone results when I receive them. If the level is normal, then we can rule out testosterone deficiency and don't need to do anything else. If it's low, we will repeat this lab and draw a couple of others, again in the morning, to get to a diagnosis. I refilled your Cialis (tadalafil) today. Continue this once daily. Think about pelvic floor physical therapy to help with your pelvic/groin pain. Think about a vasectomy consultation with Dr. Lonna Cobb.

## 2021-04-05 NOTE — Progress Notes (Signed)
04/05/2021 9:46 AM   Ronald Montgomery 14-Jul-1980 [id number]  CC: Chief Complaint  Patient presents with   Erectile Dysfunction   HPI: Ronald Montgomery is a 40 y.o. male with PMH diabetes, obesity with BMI 41, chronic prostatitis and groin pain, erectile dysfunction, and concern for hypogonadism who presents today for symptom recheck on low-dose daily tadalafil and for a.m testosterone draw.   Today he reports improvement in erection quality since starting low-dose daily tadalafil, however he is not yet at his treatment goal.  He previously reported symptoms consistent with hypogonadism including erectile dysfunction, progressive weight gain, decreased hair growth, fatigue, and decreased libido.  Notably, patient does not desire to preserve fertility as his wife has already had a tubal ligation and wishes for him to pursue vasectomy as well.  Patient also reports that he stopped Flomax after he last saw me when he started to develop some headache, dizziness, and retrograde ejaculation.  He states overall his chronic pelvic pain has improved, but he continues to have occasional twinges of pain especially radiating into the right groin.  He also reports some low back pain radiating into the buttocks and posterior bilateral lower extremities.  SHIM 14 as below.  Patient reports these responses reflected his status before starting low-dose daily tadalafil.  We verbally repeated the questionnaire based on his current symptoms on tadalafil, and his new score was 19 with improvements in erection confidence, erection firmness, and erection maintenance.  Completion and satisfaction responses remain the same.   [place] Name 04/05/21 0952         SHIM: Over the last 6 months:   How do you rate your confidence that you could get and keep an erection? Very Low     When you had erections with sexual stimulation, how often were your erections hard enough for penetration (entering your partner)?  Sometimes (about half the time)     During sexual intercourse, how often were you able to maintain your erection after you had penetrated (entered) your partner? Sometimes (about half the time)     During sexual intercourse, how difficult was it to maintain your erection to completion of intercourse? Difficult     When you attempted sexual intercourse, how often was it satisfactory for you? Most Times (much more than half the time)       SHIM Total Score   SHIM 14             PMH: Past Medical History:  Diagnosis Date   Alcohol abuse    Asthma    Chlamydia    Constipation    Diabetes mellitus without complication ([place])    Difficult intubation    Penile pain 02/03/2015   Normal exams x several    Scabies    Testicular/scrotal pain 02/03/2015   Normal exam and [place] x several.     Thyroid disorder    during childhood    Surgical History: Past Surgical History:  Procedure Laterality Date   ABCESS DRAINAGE     groin area   CYST EXCISION     top of head   DIRECT LARYNGOSCOPY  07/07/2015   Procedure: DIRECT LARYNGOSCOPY;  Surgeon: Beverly Gust, MD;  Location: [facility];  Service: ENT;;   INCISION AND DRAINAGE ABSCESS N/A 07/07/2015   Procedure: INCISION AND DRAINAGE ABSCESS;  Surgeon: Beverly Gust, MD;  Location: [facility];  Service: ENT;  Laterality: N/A;  epiglottic abscess   RHYTIDECTOMY NECK / CHEEK /  CHIN     due to accident   TONSILLECTOMY Bilateral 06/13/2017   Procedure: TONSILLECTOMY;  Surgeon: Beverly Gust, MD;  Location: [facility];  Service: ENT;  Laterality: Bilateral;    Home Medications:  Allergies as of 04/05/2021   No Known Allergies      Medication List        Accurate as of April 05, 2021  9:46 AM. If you have any questions, ask your nurse or doctor.          blood glucose meter kit and supplies Dispense based on patient and insurance preference. Use up to four times daily as directed. (FOR ICD-10 E10.9, E11.9).   celecoxib 200 MG  capsule Commonly known as: CeleBREX Take 1 capsule (200 mg total) by mouth 2 (two) times daily.   clotrimazole 10 MG troche Commonly known as: MYCELEX Take 1 tablet (10 mg total) by mouth 5 (five) times daily.   metFORMIN 500 MG tablet Commonly known as: GLUCOPHAGE Take 2 tablets in the morning (1,035m ) by mouth and 1 tablet in the evening 500 mg Please schedule an office visit before anymore refills.   omeprazole 40 MG capsule Commonly known as: PRILOSEC Take 1 capsule (40 mg total) by mouth 2 (two) times daily before a meal.   tadalafil 5 MG tablet Commonly known as: CIALIS Take 1 tablet (5 mg total) by mouth daily as needed for erectile dysfunction.   tamsulosin 0.4 MG Caps capsule Commonly known as: FLOMAX Take 1 capsule (0.4 mg total) by mouth daily.        Allergies:  No Known Allergies  Family History: Family History  Problem Relation Age of Onset   Skin cancer Mother    Diabetes Mother    Thyroid cancer Maternal Grandfather    Heart attack Maternal Grandfather    Diabetes Maternal Grandfather     Social History:   reports that he has been smoking cigarettes. He has been smoking an average of .75 packs per day. He has never used smokeless tobacco. He reports that he does not currently use alcohol. He reports that he does not currently use drugs after having used the following drugs: Marijuana.  Physical Exam: BP (!) 142/88   Pulse 78   Ht _0  (1.676 m)   Wt 257 lb (116.6 kg)   BMI 41.48 kg/m   Constitutional:  Alert and oriented, no acute distress, nontoxic appearing HEENT: [place], AT Cardiovascular: No clubbing, cyanosis, or edema Respiratory: Normal respiratory effort, no increased work of breathing Skin: No rashes, bruises or suspicious lesions Neurologic: Grossly intact, no focal deficits, moving all 4 extremities Psychiatric: Normal mood and affect  Assessment & Plan:   1. Erectile dysfunction, unspecified erectile dysfunction type Improved on  daily low-dose tadalafil, however not yet at treatment goal.  We will plan to continue this pending hypogonadism work-up as below. - tadalafil (CIALIS) 5 MG tablet; Take 1 tablet (5 mg total) by mouth daily as needed for erectile dysfunction.  Dispense: 30 tablet; Refill: 2  2. Hypogonadism in male A.m. testosterone drawn today.  We discussed that if this is normal, we can rule out hypogonadism as a diagnosis.  Alternatively, if it is borderline to low, we will plan for a repeat a.m. draw as well as LH, FSH, and prolactin.  If we ultimately diagnose hypogonadism in this patient, testosterone supplementation is not contraindicated as he does not desire further fertility maintenance.  We discussed that testosterone supplementation is not equivalent to vasectomy and  that he could still cause a pregnancy on this therapy.  Patient to consider vasectomy consult. - Testosterone  3. Pelvic pain in male Chronic, possibly associated with #4 below.  We discussed pelvic floor physical therapy and he wishes to defer this at this time, but may pursue this in the future.  Patient reported some retrograde ejaculation and orthostasis on Flomax, but this did not appear terribly bothersome and may pursue this medication for prostatitis flares in the future.  4. Bilateral sciatica Classic signs of low back pain radiating to the buttocks and BLEs.  We discussed that this is not a urologic issue, but he should discuss it further with his PCP.  He expressed understanding.  Return for Will call with results.  Debroah Loop, PA-C  [facility] [address] [place], [place] [address] [phone]

## 2021-04-06 ENCOUNTER — Telehealth: Payer: Self-pay

## 2021-04-06 ENCOUNTER — Other Ambulatory Visit: Payer: Self-pay | Admitting: Physician Assistant

## 2021-04-06 DIAGNOSIS — E291 Testicular hypofunction: Secondary | ICD-10-CM

## 2021-04-06 LAB — TESTOSTERONE: Testosterone: 314 ng/dL (ref 264–916)

## 2021-04-06 NOTE — Telephone Encounter (Signed)
CALLED PATIENT NO ANSWER LEFT VOICEMAIL FOR A CALL BACK ? ?

## 2021-04-06 NOTE — Telephone Encounter (Signed)
-----   Message from Toney Reil, MD sent at 04/05/2021 11:51 AM EST ----- Please inform pt that the tests for his elevated liver enzymes came back normal. He has fatty liver only  RV

## 2021-04-06 NOTE — Telephone Encounter (Signed)
OK per DPR, [facility] notifying patient. Made lab appt for 11/17 before 10 am. Asked patient to return call if this appt date/time does not work for him.

## 2021-04-06 NOTE — Telephone Encounter (Signed)
-----   Message from Carman Ching, [place] sent at 04/06/2021 11:59 AM EST ----- Testosterone is on the low end of normal. Please schedule him for a lab visit before 10am for repeat testosterone, LH, FSH, and prolactin. Putting in orders for these now. ----- Message ----- From: Interface, [facility] Lab Results In Sent: 04/06/2021   5:37 AM EST To: Carman Ching, PA-C

## 2021-04-15 ENCOUNTER — Other Ambulatory Visit: Payer: Self-pay

## 2021-04-15 ENCOUNTER — Other Ambulatory Visit: Payer: Managed Care, Other (non HMO)

## 2021-04-15 DIAGNOSIS — E291 Testicular hypofunction: Secondary | ICD-10-CM

## 2021-04-16 ENCOUNTER — Telehealth: Payer: Self-pay

## 2021-04-16 LAB — FSH/LH
FSH: 4.7 m[IU]/mL (ref 1.5–12.4)
LH: 5.5 m[IU]/mL (ref 1.7–8.6)

## 2021-04-16 LAB — PROLACTIN: Prolactin: 12 ng/mL (ref 4.0–15.2)

## 2021-04-16 LAB — TESTOSTERONE: Testosterone: 353 ng/dL (ref 264–916)

## 2021-04-16 NOTE — Telephone Encounter (Signed)
-----   Message from Carman Ching, [place] sent at 04/16/2021 10:07 AM EST ----- Testosterone and other labs are normal. No indication for supplemental testosterone at this time. Recommendations to naturally boost testosterone include increasing exercise, glucose control, and weight control (recommend Mediterranean diet). May offer vasectomy consult with Dr. Lonna Cobb if he desires. ----- Message ----- From: [facility] Lab Results In Sent: 04/16/2021   7:37 AM EST To: Carman Ching, PA-C

## 2021-04-20 ENCOUNTER — Telehealth: Payer: Self-pay

## 2021-04-20 NOTE — Telephone Encounter (Signed)
Left message

## 2021-04-20 NOTE — Telephone Encounter (Signed)
-----   Message from Carman Ching, [place] sent at 04/16/2021 10:07 AM EST ----- Testosterone and other labs are normal. No indication for supplemental testosterone at this time. Recommendations to naturally boost testosterone include increasing exercise, glucose control, and weight control (recommend Mediterranean diet). May offer vasectomy consult with Dr. Lonna Cobb if he desires. ----- Message ----- From: [facility] Lab Results In Sent: 04/16/2021   7:37 AM EST To: Carman Ching, PA-C

## 2021-04-28 ENCOUNTER — Encounter: Payer: Self-pay | Admitting: Urology

## 2021-04-28 ENCOUNTER — Ambulatory Visit (INDEPENDENT_AMBULATORY_CARE_PROVIDER_SITE_OTHER): Payer: Managed Care, Other (non HMO) | Admitting: Urology

## 2021-04-28 ENCOUNTER — Other Ambulatory Visit: Payer: Self-pay

## 2021-04-28 VITALS — BP 130/80 | HR 76 | Ht 66.0 in | Wt 255.0 lb

## 2021-04-28 DIAGNOSIS — Z3009 Encounter for other general counseling and advice on contraception: Secondary | ICD-10-CM | POA: Diagnosis not present

## 2021-04-28 NOTE — Patient Instructions (Signed)
Pre-Vasectomy Instructions ? ?STOP all aspirin or blood thinners (Aspirin, Plavix, Coumadin, Warfarin, Motrin, Ibuprofen, Advil, Aleve, Naproxen, Naprosyn) for 7 days prior to the procedure.  If you have any questions about stopping these medications please contact your primary care physician or cardiologist. ? ?Shave all hair from the upper scrotum on the day of the procedure.  This means just under the penis onto the scrotal sac.  The area shaved should measure about 2-3 inches around.  You may lather the scrotum with soap and water, and shave with a safety razor. ? ?After shaving the area, thoroughly wash the penis and the scrotum, then shower or bathe to remove all the loose hairs.  If needed, wash the area again just before coming in for your Vasectomy. ? ?It is recommended to have a light meal an hour or so prior to the procedure. ? ?Bring a scrotal support (jock strap or suspensory, or tight jockey shorts or underwear).  Wear comfortable pants or shorts. ? ?While the actual procedure usually takes about 45 minutes, you should be prepared to stay in the office for approximately one hour.  Bring someone with you to drive you home. ? ?If you have any questions or concerns, please feel free to call the office at [phone]. ?  ?

## 2021-04-28 NOTE — Progress Notes (Signed)
04/28/2021 10:08 AM   [name] 06-16-1980 [id number]  Referring provider: Doreen Beam, FNP [address],  [place] [address]  Chief Complaint  Patient presents with   VAS Consult    HPI: CLAIR ALFIERI is a 40 y.o. male who presents for vasectomy counseling.  Married with 4 children Seen in ED 12/29/2020 with a several week history of scrotal pain.  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  Scrotal pain has resolved No previous history inguinal hernia or pelvic surgery No history of bleeding or clotting disorders   PMH: Past Medical History:  Diagnosis Date   Alcohol abuse    Asthma    Chlamydia    Constipation    Diabetes mellitus without complication ([place])    Difficult intubation    Penile pain 02/03/2015   Normal exams x several    Scabies    Testicular/scrotal pain 02/03/2015   Normal exam and [place] x several.     Thyroid disorder    during childhood    Surgical History: Past Surgical History:  Procedure Laterality Date   ABCESS DRAINAGE     groin area   CYST EXCISION     top of head   DIRECT LARYNGOSCOPY  07/07/2015   Procedure: DIRECT LARYNGOSCOPY;  Surgeon: Beverly Gust, MD;  Location: [facility];  Service: ENT;;   INCISION AND DRAINAGE ABSCESS N/A 07/07/2015   Procedure: INCISION AND DRAINAGE ABSCESS;  Surgeon: Beverly Gust, MD;  Location: [facility];  Service: ENT;  Laterality: N/A;  epiglottic abscess   RHYTIDECTOMY NECK / CHEEK / CHIN     due to accident   TONSILLECTOMY Bilateral 06/13/2017   Procedure: TONSILLECTOMY;  Surgeon: Beverly Gust, MD;  Location: [facility];  Service: ENT;  Laterality: Bilateral;    Home Medications:  Allergies as of 04/28/2021   No Known Allergies      Medication List        Accurate as of April 28, 2021 10:08 AM. If you have any questions, ask your nurse or doctor.          blood glucose meter kit and supplies Dispense  based on patient and insurance preference. Use up to four times daily as directed. (FOR ICD-10 E10.9, E11.9).   celecoxib 200 MG capsule Commonly known as: CeleBREX Take 1 capsule (200 mg total) by mouth 2 (two) times daily.   clotrimazole 10 MG troche Commonly known as: MYCELEX Take 1 tablet (10 mg total) by mouth 5 (five) times daily.   metFORMIN 500 MG tablet Commonly known as: GLUCOPHAGE Take 2 tablets in the morning (1,019m ) by mouth and 1 tablet in the evening 500 mg Please schedule an office visit before anymore refills.   omeprazole 40 MG capsule Commonly known as: PRILOSEC Take 1 capsule (40 mg total) by mouth 2 (two) times daily before a meal.   tadalafil 5 MG tablet Commonly known as: CIALIS Take 1 tablet (5 mg total) by mouth daily as needed for erectile dysfunction.   tamsulosin 0.4 MG Caps capsule Commonly known as: FLOMAX Take 1 capsule (0.4 mg total) by mouth daily.        Allergies: No Known Allergies  Family History: Family History  Problem Relation Age of Onset   Skin cancer Mother    Diabetes Mother    Thyroid cancer Maternal Grandfather    Heart attack Maternal Grandfather    Diabetes Maternal Grandfather  Social History:  reports that he has been smoking cigarettes. He has been smoking an average of .75 packs per day. He has never used smokeless tobacco. He reports that he does not currently use alcohol. He reports that he does not currently use drugs after having used the following drugs: Marijuana.   Physical Exam: BP 130/80   Pulse 76   Ht _0  (1.676 m)   Wt 255 lb (115.7 kg)   BMI 41.16 kg/m   Constitutional:  Alert and oriented, No acute distress. HEENT: [place] AT, moist mucus membranes.  Trachea midline, no masses. Cardiovascular: No clubbing, cyanosis, or edema. Respiratory: Normal respiratory effort, no increased work of breathing. GI: Abdomen is soft, nontender, nondistended, no abdominal masses GU: Phallus without lesions,  testes descended bilaterally without masses or tenderness, spermatic cord/epididymis palpably normal bilaterally.  Vasa easily palpable bilaterally Skin: No rashes, bruises or suspicious lesions. Neurologic: Grossly intact, no focal deficits, moving all 4 extremities. Psychiatric: Normal mood and affect.   Assessment & Plan:    1.  Undesired fertility Desires to schedule vasectomy We had a long discussion about vasectomy. We specifically discussed the procedure, recovery and the risks, benefits and alternatives of vasectomy. I explained that the procedure entails removal of a segment of each vas deferens, each of which conducts sperm, and that the purpose of this procedure is to cause sterility (inability to produce children or cause pregnancy). Vasectomy is intended to be permanent and irreversible form of contraception. Options for fertility after vasectomy include vasectomy reversal, or sperm retrieval with in vitro fertilization. These options are not always successful, and they may be expensive. We discussed reversible forms of birth control such as condoms, IUD or diaphragms, as well as the option of freezing sperm in a sperm bank prior to the vasectomy procedure. We discussed the importance of avoiding strenuous exercise for four days after vasectomy, and the importance of refraining from any form of ejaculation for seven days after vasectomy. I explained that vasectomy does not produce immediate sterility so another form of contraceptive must be used until sterility is assured by having semen checked for sperm. Thus, a post vasectomy semen analysis is necessary to confirm sterility. Rarely, vasectomy must be repeated. We discussed the approximately 1 in 2,000 risk of pregnancy after vasectomy for men who have post-vasectomy semen analysis showing absent sperm or rare non-motile sperm. Typical side effects include a small amount of oozing blood, some discomfort and mild swelling in the area of  incision.  Vasectomy does not affect sexual performance, function, please, sensation, interest, desire, satisfaction, penile erection, volume of semen or ejaculation. Other rare risks include allergy or adverse reaction to an anesthetic, testicular atrophy, hematoma, infection/abscess, prolonged tenderness of the vas deferens, pain, swelling, painful nodule or scar (called sperm granuloma) or epididymtis. We discussed chronic testicular pain syndrome. This has been reported to occur in as many as 1-2% of men and may be permanent. This can be treated with medication, small procedures or (rarely) surgery. Valium 10 mg as a preprocedure anxiolytic sent to pharmacy and he was informed he would need a driver if utilizing this medication   Abbie Sons, [place] [address], [place], [place] [address] [phone]

## 2021-05-03 MED ORDER — DIAZEPAM 10 MG PO TABS: ORAL_TABLET | ORAL | 0 refills | Status: DC

## 2021-05-04 ENCOUNTER — Emergency Department
Admission: EM | Admit: 2021-05-04 | Discharge: 2021-05-04 | Disposition: A | Discharge status: Home / Self Care | Payer: Managed Care, Other (non HMO) | Attending: Emergency Medicine | Admitting: Emergency Medicine

## 2021-05-04 ENCOUNTER — Other Ambulatory Visit: Payer: Self-pay

## 2021-05-04 ENCOUNTER — Emergency Department: Payer: Managed Care, Other (non HMO)

## 2021-05-04 DIAGNOSIS — J40 Bronchitis, not specified as acute or chronic: Secondary | ICD-10-CM | POA: Diagnosis not present

## 2021-05-04 DIAGNOSIS — J45909 Unspecified asthma, uncomplicated: Secondary | ICD-10-CM | POA: Diagnosis not present

## 2021-05-04 DIAGNOSIS — Z20822 Contact with and (suspected) exposure to covid-19: Secondary | ICD-10-CM | POA: Diagnosis not present

## 2021-05-04 DIAGNOSIS — E119 Type 2 diabetes mellitus without complications: Secondary | ICD-10-CM | POA: Insufficient documentation

## 2021-05-04 DIAGNOSIS — Z7984 Long term (current) use of oral hypoglycemic drugs: Secondary | ICD-10-CM | POA: Diagnosis not present

## 2021-05-04 DIAGNOSIS — F17210 Nicotine dependence, cigarettes, uncomplicated: Secondary | ICD-10-CM | POA: Insufficient documentation

## 2021-05-04 DIAGNOSIS — R059 Cough, unspecified: Secondary | ICD-10-CM | POA: Diagnosis present

## 2021-05-04 LAB — RESP PANEL BY RT-PCR (FLU A&B, COVID) ARPGX2
Influenza A by PCR: NEGATIVE
Influenza B by PCR: NEGATIVE
SARS Coronavirus 2 by RT PCR: NEGATIVE

## 2021-05-04 LAB — URINALYSIS, COMPLETE (UACMP) WITH MICROSCOPIC
Bacteria, UA: NONE SEEN
Bilirubin Urine: NEGATIVE
Glucose, UA: >=500 mg/dL — AB
Hgb urine dipstick: NEGATIVE
Ketones, ur: NEGATIVE mg/dL
Leukocytes,Ua: NEGATIVE
Nitrite: NEGATIVE
Protein, ur: NEGATIVE mg/dL
Specific Gravity, Urine: 1.021 (ref 1.005–1.030)
Squamous Epithelial / HPF: NONE SEEN (ref 0–5)
pH: 5 (ref 5.0–8.0)

## 2021-05-04 MED ORDER — PREDNISONE 50 MG PO TABS: 50.0000 mg | ORAL_TABLET | Freq: Every day | ORAL | 0 refills | Status: AC

## 2021-05-04 NOTE — ED Provider Notes (Signed)
[facility] Emergency Department Provider Note   ____________________________________________    I have reviewed the triage vital signs and the nursing notes.   HISTORY  Chief Complaint Cough     HPI Ronald Montgomery is a 40 y.o. male with history as noted below who presents with complaints of a cough times several weeks.  Patient reports the cough is dry, denies fevers, occasionally has congestion.  Has not take anything for this.  No shortness of breath.  Does smoke cigarettes.  Past Medical History:  Diagnosis Date   Alcohol abuse    Asthma    Chlamydia    Constipation    Diabetes mellitus without complication ([place])    Difficult intubation    Penile pain 02/03/2015   Normal exams x several    Scabies    Testicular/scrotal pain 02/03/2015   Normal exam and [place] x several.     Thyroid disorder    during childhood    Patient Active Problem List   Diagnosis Date Noted   Cervical pain 05/11/2020   Acute right-sided thoracic back pain 05/08/2020   Multiple stiff joints 05/01/2020   Diabetes mellitus without complication ([place]) 93/71/6967   Lumbar back pain 05/01/2020   Screening for blood or protein in urine 05/01/2020   Body mass index (BMI) of 40.1-44.9 in adult [facility]) 05/01/2020   Smoker 05/01/2020   Snoring 05/01/2020   Poor sleep 05/01/2020   Fatigue 05/01/2020   Paresthesia of both lower extremities 05/01/2020   Vitamin D deficiency 05/01/2020   Epiglottitis 07/07/2015   Penile pain 02/03/2015   Disorder of male genital organs 02/03/2015    Past Surgical History:  Procedure Laterality Date   ABCESS DRAINAGE     groin area   CYST EXCISION     top of head   DIRECT LARYNGOSCOPY  07/07/2015   Procedure: DIRECT LARYNGOSCOPY;  Surgeon: Beverly Gust, MD;  Location: [facility] ORS;  Service: ENT;;   INCISION AND DRAINAGE ABSCESS N/A 07/07/2015   Procedure: INCISION AND DRAINAGE ABSCESS;  Surgeon: Beverly Gust, MD;  Location: [facility] ORS;   Service: ENT;  Laterality: N/A;  epiglottic abscess   RHYTIDECTOMY NECK / CHEEK / CHIN     due to accident   TONSILLECTOMY Bilateral 06/13/2017   Procedure: TONSILLECTOMY;  Surgeon: Beverly Gust, MD;  Location: [facility] ORS;  Service: ENT;  Laterality: Bilateral;    Prior to Admission medications   Medication Sig Start Date End Date Taking? Authorizing Provider  predniSONE (DELTASONE) 50 MG tablet Take 1 tablet (50 mg total) by mouth daily with breakfast for 5 days. 05/04/21 05/09/21 Yes Lavonia Drafts, MD  blood glucose meter kit and supplies Dispense based on patient and insurance preference. Use up to four times daily as directed. (FOR ICD-10 E10.9, E11.9). 06/03/20   Flinchum, Kelby Aline, FNP  celecoxib (CELEBREX) 200 MG capsule Take 1 capsule (200 mg total) by mouth 2 (two) times daily. 02/04/21   Stoioff, Ronda Fairly, MD  clotrimazole (MYCELEX) 10 MG troche Take 1 tablet (10 mg total) by mouth 5 (five) times daily. 01/19/21   Menshew, Dannielle Karvonen, PA-C  diazepam (VALIUM) 10 MG tablet 1 tab po 30 min prior to procedure 05/03/21   Stoioff, Ronda Fairly, MD  metFORMIN (GLUCOPHAGE) 500 MG tablet Take 2 tablets in the morning (1,096m ) by mouth and 1 tablet in the evening 500 mg Please schedule an office visit before anymore refills. 02/05/21   WKennyth Arnold FNP  omeprazole (PRILOSEC)  40 MG capsule Take 1 capsule (40 mg total) by mouth 2 (two) times daily before a meal. 03/30/21 04/29/21  Vanga, Tally Due, MD  tadalafil (CIALIS) 5 MG tablet Take 1 tablet (5 mg total) by mouth daily as needed for erectile dysfunction. 04/05/21   Vaillancourt, Aldona Bar, PA-C  tamsulosin (FLOMAX) 0.4 MG CAPS capsule Take 1 capsule (0.4 mg total) by mouth daily. 03/08/21   Debroah Loop, PA-C     Allergies Patient has no known allergies.  Family History  Problem Relation Age of Onset   Skin cancer Mother    Diabetes Mother    Thyroid cancer Maternal Grandfather    Heart attack Maternal Grandfather    Diabetes  Maternal Grandfather     Social History Social History   Tobacco Use   Smoking status: Every Day    Packs/day: 0.75    Types: Cigarettes   Smokeless tobacco: Never  Vaping Use   Vaping Use: Never used  Substance Use Topics   Alcohol use: Not Currently    Comment: patient reports quit drinking in 09/2020.   Drug use: Not Currently    Types: Marijuana    Comment: per patient last use was in early 20s.    Review of Systems  Constitutional: No fever/chills  ENT: As above  Respiratory: As above Gastrointestinal: No nausea, no vomiting.   Genitourinary: Negative for dysuria. Musculoskeletal: Negative for back pain. Skin: Negative for rash. Neurological: Negative for headaches     ____________________________________________   PHYSICAL EXAM:  VITAL SIGNS: ED Triage Vitals  Enc Vitals Group     BP 05/04/21 0917 (!) 157/100     Pulse Rate 05/04/21 0917 (!) 103     Resp 05/04/21 0917 16     Temp 05/04/21 0917 98.3 F (36.8 C)     Temp Source 05/04/21 0917 Oral     SpO2 05/04/21 0917 96 %     Weight --      Height --      Head Circumference --      Peak Flow --      Pain Score 05/04/21 0915 6     Pain Loc --      Pain Edu? --      Excl. in [place]? --      Constitutional: Alert and oriented. No acute distress. Pleasant and interactive Eyes: Conjunctivae are normal.  Head: Atraumatic. Nose: No congestion/rhinnorhea. Mouth/Throat: Mucous membranes are moist.   Cardiovascular: Normal rate, regular rhythm.  Respiratory: Normal respiratory effort.  No retractions.  No wheezing  Musculoskeletal: No lower extremity tenderness nor edema.   Neurologic:  Normal speech and language. No gross focal neurologic deficits are appreciated.   Skin:  Skin is warm, dry and intact. No rash noted.   ____________________________________________   LABS (all labs ordered are listed, but only abnormal results are displayed)  Labs Reviewed  URINALYSIS, COMPLETE (UACMP) WITH  MICROSCOPIC - Abnormal; Notable for the following components:      Result Value   Color, Urine YELLOW (*)    APPearance CLEAR (*)    Glucose, UA >=500 (*)    All other components within normal limits  RESP PANEL BY RT-PCR (FLU A&B, COVID) [id number]   ____________________________________________  EKG   ____________________________________________  RADIOLOGY Chest x-ray reviewed by me, no acute abnormality  ____________________________________________   PROCEDURES  Procedure(s) performed: No  Procedures   Critical Care performed: No ____________________________________________   INITIAL IMPRESSION / ASSESSMENT AND PLAN / ED COURSE  Pertinent labs &  imaging results that were available during my care of the patient were reviewed by me and considered in my medical decision making (see chart for details).   Patient overall well-appearing in no acute distress, vital signs unremarkable, exam is benign, no wheezing, no rales on exam.  Chest x-ray without evidence of pneumonia, will treat with prednisone for presumed bronchitis, outpatient follow-up recommended   ____________________________________________   FINAL CLINICAL IMPRESSION(S) / ED DIAGNOSES  Final diagnoses:  Bronchitis      NEW MEDICATIONS STARTED DURING THIS VISIT:  Discharge Medication List as of 05/04/2021 10:34 AM     START taking these medications   Details  predniSONE (DELTASONE) 50 MG tablet Take 1 tablet (50 mg total) by mouth daily with breakfast for 5 days., Starting Tue 05/04/2021, Until Sun 05/09/2021, Normal         Note:  This document was prepared using Dragon voice recognition software and may include unintentional dictation errors.    Lavonia Drafts, MD 05/04/21 [phone]

## 2021-05-04 NOTE — ED Notes (Signed)
Says cough for weeks.  No energy.  Feeling run down.  Occasional cough.

## 2021-05-04 NOTE — ED Triage Notes (Signed)
Pt come with c/o cough and congestion for few weeks. Pt states trouble urinating that started few days ago. Pt states some pain and feels like he should.

## 2021-05-20 ENCOUNTER — Encounter: Payer: Managed Care, Other (non HMO) | Admitting: Urology

## 2021-06-04 ENCOUNTER — Encounter: Payer: Managed Care, Other (non HMO) | Admitting: Urology

## 2021-06-14 NOTE — Progress Notes (Signed)
06/15/2021 11:17 AM   Ronald Montgomery 04-25-1981 [id number]  Referring provider: Doreen Beam, FNP [address]  [place] [address]  Chief Complaint  Patient presents with   Testicle Pain   Urological history: 1.  Undesired fertility -Had vasectomy consult November 2022  2. Testicular pain -scrotal ultrasound 12/2020 - bilateral epididymal cysts  3. ED -Contributing factors of age, diabetes, obesity, smoking, HTN and history of alcohol abuse -managed with tadalafil 5 mg daily   HPI: Ronald Montgomery is a 41 y.o. male who presents today for bilateral testicular pain.  Patient has been having episodes of genitourinary pain since 2013.  He has had several imaging studies, cultures and ER visits with negative work-ups.  UA negative  PVR 34 mL  Over the weekend he started to experience pain on the bilateral side of his penis and in his scrotal area.  He also states he is having difficulty having bowel movements.  He is having intermittent dysuria, urge incontinence, back pain and lower abdominal pain.  Patient denies any modifying or aggravating factors.  Patient denies any gross hematuria, dysuria or suprapubic/flank pain.  Patient denies any fevers, chills, nausea or vomiting.      PMH: Past Medical History:  Diagnosis Date   Alcohol abuse    Asthma    Chlamydia    Constipation    Diabetes mellitus without complication ([place])    Difficult intubation    Penile pain 02/03/2015   Normal exams x several    Scabies    Testicular/scrotal pain 02/03/2015   Normal exam and [place] x several.     Thyroid disorder    during childhood    Surgical History: Past Surgical History:  Procedure Laterality Date   ABCESS DRAINAGE     groin area   CYST EXCISION     top of head   DIRECT LARYNGOSCOPY  07/07/2015   Procedure: DIRECT LARYNGOSCOPY;  Surgeon: Beverly Gust, MD;  Location: [facility] ORS;  Service: ENT;;   INCISION AND DRAINAGE ABSCESS N/A 07/07/2015    Procedure: INCISION AND DRAINAGE ABSCESS;  Surgeon: Beverly Gust, MD;  Location: [facility] ORS;  Service: ENT;  Laterality: N/A;  epiglottic abscess   RHYTIDECTOMY NECK / CHEEK / CHIN     due to accident   TONSILLECTOMY Bilateral 06/13/2017   Procedure: TONSILLECTOMY;  Surgeon: Beverly Gust, MD;  Location: [facility] ORS;  Service: ENT;  Laterality: Bilateral;    Home Medications:  Allergies as of 06/15/2021   No Known Allergies      Medication List        Accurate as of June 15, 2021 11:17 AM. If you have any questions, ask your nurse or doctor.          blood glucose meter kit and supplies Dispense based on patient and insurance preference. Use up to four times daily as directed. (FOR ICD-10 E10.9, E11.9).   celecoxib 200 MG capsule Commonly known as: CeleBREX Take 1 capsule (200 mg total) by mouth 2 (two) times daily.   clotrimazole 10 MG troche Commonly known as: MYCELEX Take 1 tablet (10 mg total) by mouth 5 (five) times daily.   diazepam 10 MG tablet Commonly known as: VALIUM 1 tab po 30 min prior to procedure   metFORMIN 500 MG tablet Commonly known as: GLUCOPHAGE Take 2 tablets in the morning (1,072m ) by mouth and 1 tablet in the evening 500 mg Please schedule an office visit before anymore refills.  omeprazole 40 MG capsule Commonly known as: PRILOSEC Take 1 capsule (40 mg total) by mouth 2 (two) times daily before a meal.   sulfamethoxazole-trimethoprim 800-160 MG tablet Commonly known as: BACTRIM DS Take 1 tablet by mouth every 12 (twelve) hours. Started by: Zara Council, PA-C   tadalafil 5 MG tablet Commonly known as: CIALIS Take 1 tablet (5 mg total) by mouth daily as needed for erectile dysfunction.   tamsulosin 0.4 MG Caps capsule Commonly known as: FLOMAX Take 1 capsule (0.4 mg total) by mouth daily. What changed: Another medication with the same name was added. Make sure you understand how and when to take each. Changed by: Zara Council, PA-C   tamsulosin 0.4 MG Caps capsule Commonly known as: FLOMAX Take 1 capsule (0.4 mg total) by mouth daily. What changed: You were already taking a medication with the same name, and this prescription was added. Make sure you understand how and when to take each. Changed by: Zara Council, PA-C        Allergies: No Known Allergies  Family History: Family History  Problem Relation Age of Onset   Skin cancer Mother    Diabetes Mother    Thyroid cancer Maternal Grandfather    Heart attack Maternal Grandfather    Diabetes Maternal Grandfather     Social History:  reports that he has been smoking cigarettes. He has been smoking an average of .75 packs per day. He has never used smokeless tobacco. He reports that he does not currently use alcohol. He reports that he does not currently use drugs after having used the following drugs: Marijuana.  ROS: Pertinent ROS in HPI  Physical Exam: BP (!) 148/97    Pulse 94    Ht 5' 6"  (1.676 m)    Wt 255 lb (115.7 kg)    BMI 41.16 kg/m   Constitutional:  Well nourished. Alert and oriented, No acute distress. HEENT: [place] AT, mask in place.  Trachea midline Cardiovascular: No clubbing, cyanosis, or edema. Respiratory: Normal respiratory effort, no increased work of breathing. GU: No CVA tenderness.  No bladder fullness or masses.  Patient with uncircumcised phallus. Foreskin easily retracted  Urethral meatus is patent.  No penile discharge. No penile lesions or rashes. Scrotum without lesions, cysts, rashes and/or edema.  Testicles are located scrotally bilaterally. No masses are appreciated in the testicles. Left and right epididymis are normal.  Small spermatocele in right epididymis. Rectal: Patient with  normal sphincter tone. Anus and perineum without scarring or rashes. No rectal masses are appreciated. Prostate is approximately 45 grams, no nodules are appreciated. Seminal vesicles could not be palpated. Neurologic: Grossly  intact, no focal deficits, moving all 4 extremities. Psychiatric: Normal mood and affect.  Laboratory Data: Lab Results  Component Value Date   WBC 11.4 (H) 08/14/2020   HGB 16.1 08/14/2020   HCT 46.3 08/14/2020   MCV 92.2 08/14/2020   PLT 172 08/14/2020    Lab Results  Component Value Date   CREATININE 0.83 08/14/2020    Lab Results  Component Value Date   TESTOSTERONE 353 04/15/2021    Lab Results  Component Value Date   AST 36 03/30/2021   Lab Results  Component Value Date   ALT 60 (H) 03/30/2021    Urinalysis Results for orders placed or performed in visit on 06/15/21  Microscopic Examination   Urine  Result Value Ref Range   WBC, UA 0-5 0 - 5 /hpf   RBC None seen 0 - 2 /  hpf   Epithelial Cells (non renal) 0-10 0 - 10 /hpf   Bacteria, UA None seen None seen/Few  Urinalysis, Complete  Result Value Ref Range   Specific Gravity, UA 1.020 1.005 - 1.030   pH, UA 5.5 5.0 - 7.5   Color, UA Yellow Yellow   Appearance Ur Clear Clear   Leukocytes,UA Negative Negative   Protein,UA Negative Negative/Trace   Glucose, UA 3+ (A) Negative   Ketones, UA Trace (A) Negative   RBC, UA Negative Negative   Bilirubin, UA Negative Negative   Urobilinogen, Ur 0.2 0.2 - 1.0 mg/dL   Nitrite, UA Negative Negative   Microscopic Examination See below:   Bladder Scan (Post Void Residual) in office  Result Value Ref Range   Scan Result 21m       I have reviewed the labs.   Pertinent Imaging:  06/15/21 10:56  Scan Result 333m   Assessment & Plan:    1. Testicular pain -Exam essentially benign at this visit -Start tamsulosin 0.4 mg daily -1 week Septra DS twice daily for the dysuria  2. Pelvic pain in male -Explained that as he has issues with his lumbar spine and what sounds like his thoracic spine, his symptoms may not be of GU origin but musculoskeletal in nature and follow up with PCP -Pelvic floor physical therapy would be of high burden for him at this time due  to job constraints and having a newborn baby at home  3.  Glucosuria -Explained the importance of good diabetes control and to get with his PCP as he may need increase in his metformin as his most recent hemoglobin A1c was 8.1 -I also explained that uncontrolled diabetes will definitely put him at higher risk for severe infection and  Return in about 1 month (around 07/16/2021) for symptom recheck .  These notes generated with voice recognition software. I apologize for typographical errors.  SHZara CouncilPA-C  [facility] [address] [phone]

## 2021-06-15 ENCOUNTER — Encounter: Payer: Self-pay | Admitting: Urology

## 2021-06-15 ENCOUNTER — Other Ambulatory Visit: Payer: Self-pay

## 2021-06-15 ENCOUNTER — Ambulatory Visit (INDEPENDENT_AMBULATORY_CARE_PROVIDER_SITE_OTHER): Payer: Managed Care, Other (non HMO) | Admitting: Urology

## 2021-06-15 VITALS — BP 148/97 | HR 94 | Ht 66.0 in | Wt 255.0 lb

## 2021-06-15 DIAGNOSIS — N50812 Left testicular pain: Secondary | ICD-10-CM

## 2021-06-15 DIAGNOSIS — N50811 Right testicular pain: Secondary | ICD-10-CM

## 2021-06-15 DIAGNOSIS — R102 Pelvic and perineal pain: Secondary | ICD-10-CM

## 2021-06-15 DIAGNOSIS — R81 Glycosuria: Secondary | ICD-10-CM

## 2021-06-15 LAB — URINALYSIS, COMPLETE
Bilirubin, UA: NEGATIVE
Leukocytes,UA: NEGATIVE
Nitrite, UA: NEGATIVE
Protein,UA: NEGATIVE
RBC, UA: NEGATIVE
Specific Gravity, UA: 1.02 (ref 1.005–1.030)
Urobilinogen, Ur: 0.2 mg/dL (ref 0.2–1.0)
pH, UA: 5.5 (ref 5.0–7.5)

## 2021-06-15 LAB — MICROSCOPIC EXAMINATION
Bacteria, UA: NONE SEEN
RBC, Urine: NONE SEEN /hpf (ref 0–2)

## 2021-06-15 LAB — BLADDER SCAN AMB NON-IMAGING

## 2021-06-15 MED ORDER — TAMSULOSIN HCL 0.4 MG PO CAPS: 0.4000 mg | ORAL_CAPSULE | Freq: Every day | ORAL | 0 refills | Status: DC

## 2021-06-15 MED ORDER — SULFAMETHOXAZOLE-TRIMETHOPRIM 800-160 MG PO TABS: 1.0000 | ORAL_TABLET | Freq: Two times a day (BID) | ORAL | 0 refills | Status: DC

## 2021-07-05 ENCOUNTER — Other Ambulatory Visit: Payer: Self-pay

## 2021-07-05 ENCOUNTER — Ambulatory Visit: Payer: Self-pay | Admitting: Nurse Practitioner

## 2021-07-05 DIAGNOSIS — Z113 Encounter for screening for infections with a predominantly sexual mode of transmission: Secondary | ICD-10-CM

## 2021-07-05 DIAGNOSIS — Z792 Long term (current) use of antibiotics: Secondary | ICD-10-CM

## 2021-07-05 LAB — GRAM STAIN

## 2021-07-05 LAB — HEPATITIS B SURFACE ANTIGEN: Hepatitis B Surface Ag: NONREACTIVE

## 2021-07-05 MED ORDER — CEFTRIAXONE SODIUM 500 MG IJ SOLR: 500.0000 mg | Freq: Once | INTRAMUSCULAR | Status: DC

## 2021-07-05 MED ORDER — DOXYCYCLINE HYCLATE 100 MG PO TABS
100.0000 mg | ORAL_TABLET | Freq: Two times a day (BID) | ORAL | 0 refills | Status: DC
Start: 2021-07-05 — End: 2021-11-19

## 2021-07-05 NOTE — Progress Notes (Signed)
[facility] Department STI clinic/screening visit  Subjective:  Ronald Montgomery is a 41 y.o. male being seen today for an STI screening visit. The patient reports they do have symptoms.    Patient has the following medical conditions:   Patient Active Problem List   Diagnosis Date Noted   Cervical pain 05/11/2020   Acute right-sided thoracic back pain 05/08/2020   Multiple stiff joints 05/01/2020   Diabetes mellitus without complication ([facility]) 05/01/2020   Lumbar back pain 05/01/2020   Screening for blood or protein in urine 05/01/2020   Body mass index (BMI) of 40.1-44.9 in adult [facility]) 05/01/2020   Smoker 05/01/2020   Snoring 05/01/2020   Poor sleep 05/01/2020   Fatigue 05/01/2020   Paresthesia of both lower extremities 05/01/2020   Vitamin D deficiency 05/01/2020   Epiglottitis 07/07/2015   Penile pain 02/03/2015   Disorder of male genital organs 02/03/2015     Chief Complaint  Patient presents with   SEXUALLY TRANSMITTED DISEASE    Screening     HPI  Patient reports to the clinic today for a STD screening.  Patient states that he has had penile pain for a month with some dysuria and lower abdominal pain.  Patient had a visit with an urgent care on 06/15/21.  At that visit he was diagnosed with glucosuria.  Discussed recommendations to obtain a PCP and to possibly have Metformin adjusted.  Patient states that he has a PCP appointment within the next month and his Metformin has been increased.  Also discussed diet and exercise.  Patient was also given Septra DS at visit.    Does the patient or their partner desires a pregnancy in the next year? No  Screening for MPX risk: Does the patient have an unexplained rash? No Is the patient MSM? No Does the patient endorse multiple sex partners or anonymous sex partners? No Did the patient have close or sexual contact with a person diagnosed with MPX? No Has the patient traveled outside the [place] where MPX is endemic?  No Is there a high clinical suspicion for MPX-- evidenced by one of the following No  -Unlikely to be chickenpox  -Lymphadenopathy  -Rash that present in same phase of evolution on any given body part   See flowsheet for further details and programmatic requirements.    The following portions of the patient's history were reviewed and updated as appropriate: allergies, current medications, past medical history, past social history, past surgical history and problem list.  Objective:  There were no vitals filed for this visit.  Physical Exam Constitutional:      Appearance: He is obese.  HENT:     Head: Normocephalic.     Mouth/Throat:     Mouth: Mucous membranes are moist.     Comments: No visible signs of dental caries.  Last dental exam 6 months ago.  Pulmonary:     Effort: Pulmonary effort is normal.  Abdominal:     General: Abdomen is flat.     Palpations: Abdomen is soft.  Genitourinary:    Penis: Uncircumcised.      Comments: Pubic area without nits, lice, hair loss, edema, erythema, lesions and inguinal adenopathy. Penis without rash, lesions and discharge at meatus. Testicles descended bilaterally,nt, no masses or edema.  Musculoskeletal:     Cervical back: Full passive range of motion without pain, normal range of motion and neck supple.  Skin:    General: Skin is warm and moist.  Neurological:  Mental Status: He is alert and oriented to person, place, and time.  Psychiatric:        Attention and Perception: Attention normal.        Mood and Affect: Mood normal.        Speech: Speech normal.        Behavior: Behavior is cooperative.      Assessment and Plan:  Ronald Montgomery is a 41 y.o. male presenting to the [facility] Department for STI screening  1. Screening examination for venereal disease -41 year old male in clinic today for STD screening.  -Patient encouraged to follow up with PCP regarding glucosuria.  Continue medication regimen,  glucose monitoring, and good diet control and exercises. -Patient does have STI symptoms Patient accepted all screenings including bloodwork for HIV/RPR, HBV/HCV. Patient meets criteria for HepB screening? Yes. Ordered? Yes Patient meets criteria for HepC screening? Yes. Ordered? Yes Recommended condom use with all sex Discussed importance of condom use for STI prevent   Discussed time line for State [facility] results and that patient will be called with positive results and encouraged patient to call if he had not heard in 2 weeks Recommended returning for continued or worsening symptoms.    - Gram stain - Gonococcus culture - HIV/HCV [place] [facility] - Syphilis Serology, [place] [facility] - HBV Antigen/Antibody [facility]    2. Prophylactic antibiotic -Gram stain reviewed. Please treat patient prophylactic for Epididymitis.    - doxycycline (VIBRA-TABS) 100 MG tablet; Take 1 tablet (100 mg total) by mouth 2 (two) times daily.  Dispense: 14 tablet; Refill: 0 - cefTRIAXone (ROCEPHIN) injection 500 mg     Return if symptoms worsen or fail to improve.  Future Appointments  Date Time Provider Department Center  07/16/2021  9:30 AM Marvel Plan, [place], PA-C [facility] None  07/28/2021  1:30 PM Vanga, Loel Dubonnet, MD [facility] None    Glenna Fellows, FNP

## 2021-07-05 NOTE — Progress Notes (Signed)
Pt here for STD screening.  Gram stain results reviewed.  Medication dispensed per Provider orders. Pt encouraged to eat healthy, lose weight and follow-up with his PCP.   Pt declined condoms.  Berdie Ogren, RN

## 2021-07-10 LAB — GONOCOCCUS CULTURE

## 2021-07-15 NOTE — Progress Notes (Incomplete)
07/15/21 8:25 AM   [name] [id number] [id number]  Referring provider:  Doreen Beam, FNP [address]  [place] [address] No chief complaint on file.   Urological history: 1.  Undesired fertility -Had vasectomy consult November 2022   2. Testicular pain -scrotal ultrasound 12/2020 - bilateral epididymal cysts - Managed tamsulosin 0.4 mg daily    3. ED -Contributing factors of age, diabetes, obesity, smoking, HTN and history of alcohol abuse -managed with tadalafil 5 mg daily   HPI: Ronald Montgomery is a 41 y.o.male who returns today for 1 month symptom recheck.       PMH: Past Medical History:  Diagnosis Date   Alcohol abuse    Asthma    Chlamydia    Constipation    Diabetes mellitus without complication ([place])    Difficult intubation    Penile pain 02/03/2015   Normal exams x several    Scabies    Testicular/scrotal pain 02/03/2015   Normal exam and [place] x several.     Thyroid disorder    during childhood    Surgical History: Past Surgical History:  Procedure Laterality Date   ABCESS DRAINAGE     groin area   CYST EXCISION     top of head   DIRECT LARYNGOSCOPY  07/07/2015   Procedure: DIRECT LARYNGOSCOPY;  Surgeon: Beverly Gust, MD;  Location: [facility] ORS;  Service: ENT;;   INCISION AND DRAINAGE ABSCESS N/A 07/07/2015   Procedure: INCISION AND DRAINAGE ABSCESS;  Surgeon: Beverly Gust, MD;  Location: [facility] OR[facility];  Service: ENT;  Laterality: N/A;  epiglottic abscess   RHYTIDECTOMY NECK / CHEEK / CHIN     due to accident   TONSILLECTOMY Bilateral 06/13/2017   Procedure: TONSILLECTOMY;  Surgeon: Beverly Gust, MD;  Location: [facility];  Service: ENT;  Laterality: Bilateral;    Home Medications:  Allergies as of 07/16/2021   No Known Allergies      Medication List        Accurate as of July 15, 2021  8:25 AM. If you have any questions, ask your nurse or doctor.          blood glucose meter kit and supplies Dispense  based on patient and insurance preference. Use up to four times daily as directed. (FOR ICD-10 E10.9, E11.9).   celecoxib 200 MG capsule Commonly known as: CeleBREX Take 1 capsule (200 mg total) by mouth 2 (two) times daily.   clotrimazole 10 MG troche Commonly known as: MYCELEX Take 1 tablet (10 mg total) by mouth 5 (five) times daily.   diazepam 10 MG tablet Commonly known as: VALIUM 1 tab po 30 min prior to procedure   doxycycline 100 MG tablet Commonly known as: VIBRA-TABS Take 1 tablet (100 mg total) by mouth 2 (two) times daily.   metFORMIN 500 MG tablet Commonly known as: GLUCOPHAGE Take 2 tablets in the morning (1,090m ) by mouth and 1 tablet in the evening 500 mg Please schedule an office visit before anymore refills. What changed:  how much to take how to take this when to take this   omeprazole 40 MG capsule Commonly known as: PRILOSEC Take 1 capsule (40 mg total) by mouth 2 (two) times daily before a meal.   sulfamethoxazole-trimethoprim 800-160 MG tablet Commonly known as: BACTRIM DS Take 1 tablet by mouth every 12 (twelve) hours.   tadalafil 5 MG tablet Commonly known as: CIALIS Take 1 tablet (5 mg total) by mouth daily as needed  for erectile dysfunction.   tamsulosin 0.4 MG Caps capsule Commonly known as: FLOMAX Take 1 capsule (0.4 mg total) by mouth daily.   tamsulosin 0.4 MG Caps capsule Commonly known as: FLOMAX Take 1 capsule (0.4 mg total) by mouth daily.        Allergies:  No Known Allergies  Family History: Family History  Problem Relation Age of Onset   Skin cancer Mother    Diabetes Mother    Thyroid cancer Maternal Grandfather    Heart attack Maternal Grandfather    Diabetes Maternal Grandfather     Social History:  reports that he has been smoking cigarettes. He has been smoking an average of .5 packs per day. He has never used smokeless tobacco. He reports current alcohol use. He reports that he does not currently use drugs  after having used the following drugs: Marijuana.   Physical Exam: There were no vitals taken for this visit.  Constitutional:  Alert and oriented, No acute distress. HEENT: [place] AT, moist mucus membranes.  Trachea midline, no masses. Cardiovascular: No clubbing, cyanosis, or edema. Respiratory: Normal respiratory effort, no increased work of breathing. GI: Abdomen is soft, nontender, nondistended, no abdominal masses GU: No CVA tenderness Lymph: No cervical or inguinal lymphadenopathy. Skin: No rashes, bruises or suspicious lesions. Neurologic: Grossly intact, no focal deficits, moving all 4 extremities. Psychiatric: Normal mood and affect.  Laboratory Data: Lab Results  Component Value Date   CREATININE 0.83 08/14/2020   Lab Results  Component Value Date   HGBA1C 8.9 (H) 05/04/2020    Urinalysis   Pertinent Imaging:   Assessment & Plan:     No follow-ups on file.  [place] [address], [place], [place] [address] [phone]  I,Kailey Littlejohn,acting as a scribe for [facility], PA-C.,have documented all relevant documentation on the behalf of SHANNON MCGOWAN, PA-C,as directed by  [facility], PA-C while in the presence of [place], PA-C.

## 2021-07-16 ENCOUNTER — Ambulatory Visit: Payer: Managed Care, Other (non HMO) | Admitting: Urology

## 2021-07-19 ENCOUNTER — Encounter: Payer: Self-pay | Admitting: Urology

## 2021-07-27 ENCOUNTER — Other Ambulatory Visit: Payer: Self-pay

## 2021-07-28 ENCOUNTER — Ambulatory Visit: Payer: PRIVATE HEALTH INSURANCE | Admitting: Gastroenterology

## 2021-08-16 IMAGING — US US SCROTUM W/ DOPPLER COMPLETE
1 series · 14 of 25 positions shown · non-contrast
Comparison: 01/27/2015 ultrasound

CLINICAL DATA: Scrotal pain for several weeks

EXAM:
SCROTAL ULTRASOUND
DOPPLER ULTRASOUND OF THE TESTICLES
TECHNIQUE: Complete ultrasound examination of the testicles, epididymis, and
other scrotal structures was performed. Color and spectral Doppler
ultrasound were also utilized to evaluate blood flow to the
testicles.

[Series 1: us scrotum w/doppler · 14 of 84 slices shown]
[im 1/84]
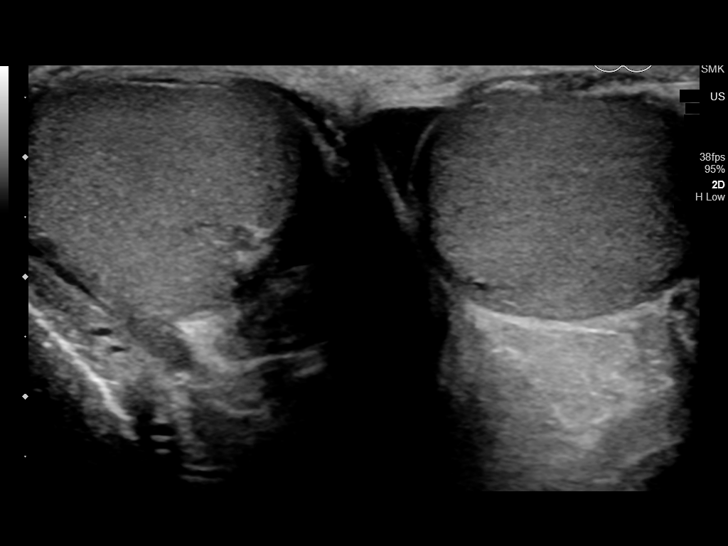
[im 7/84]
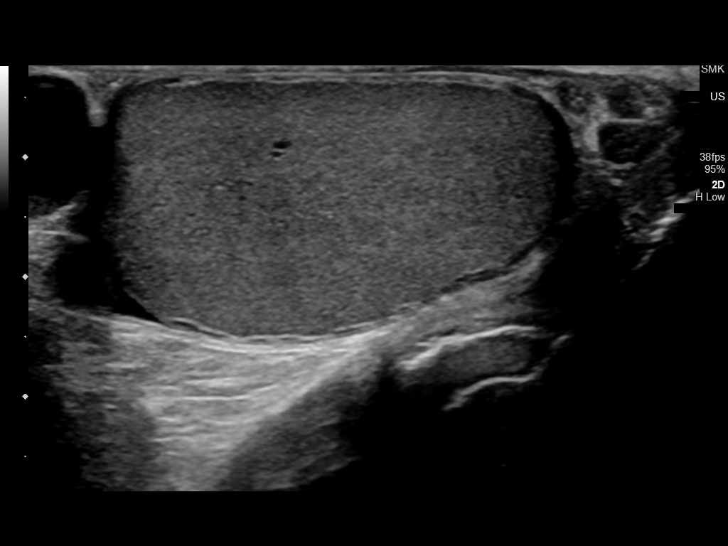
[im 14/84]
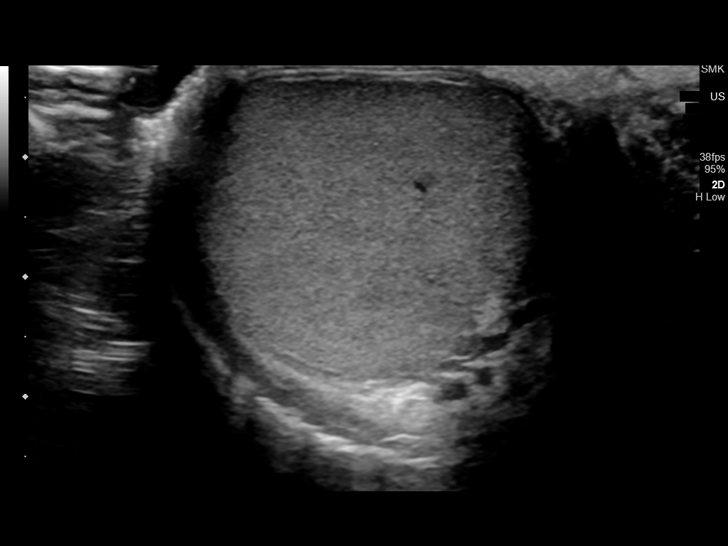
[im 21/84]
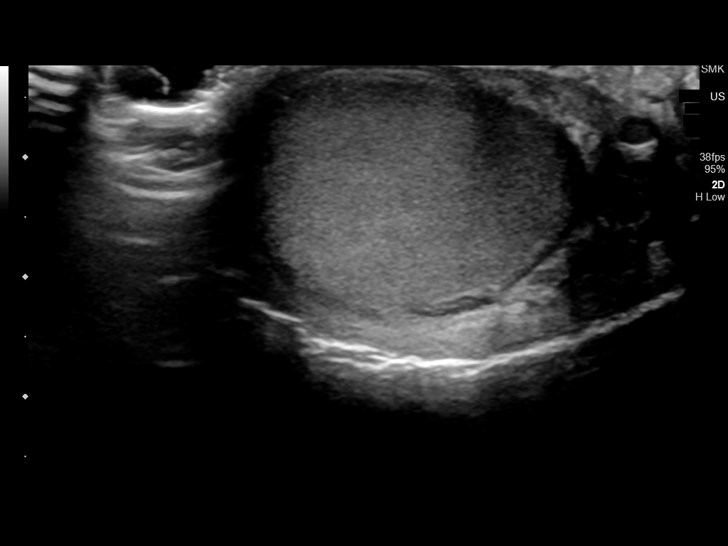
[im 28/84]
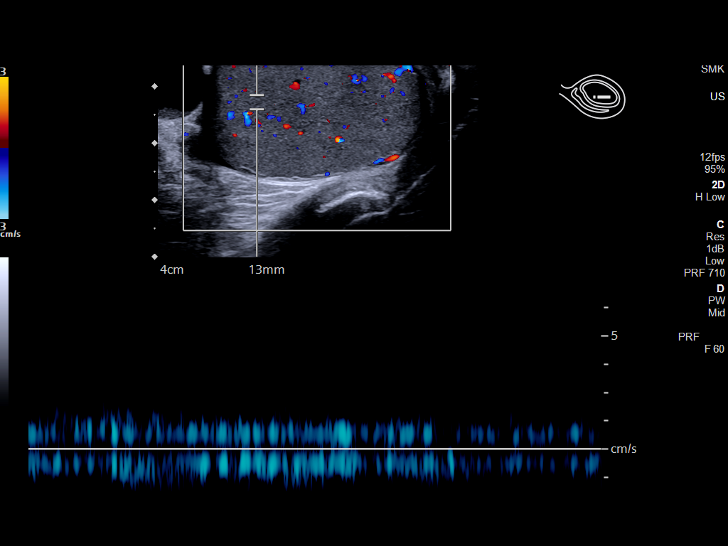
[im 32/84]
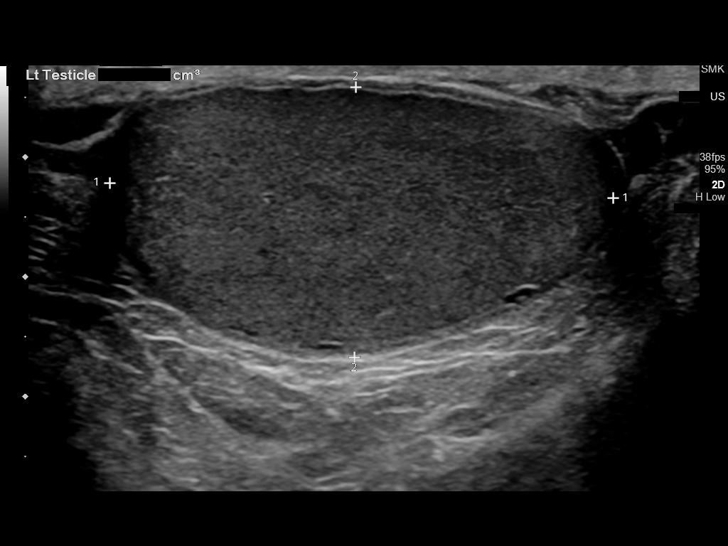
[im 39/84]
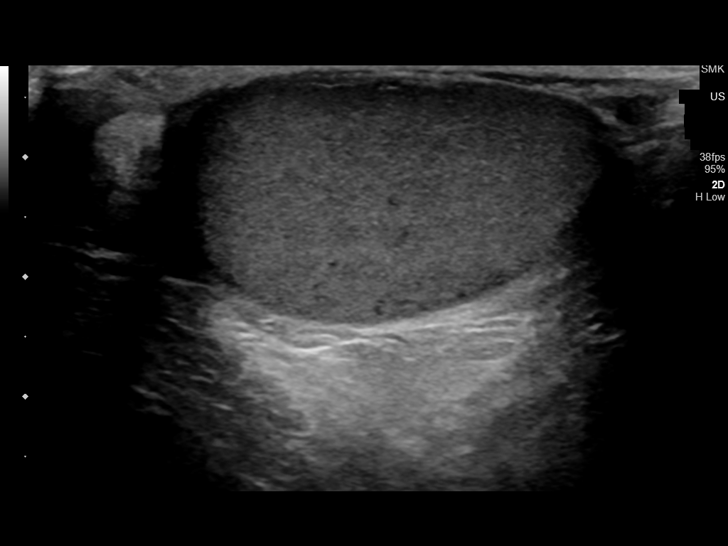
[im 45/84]
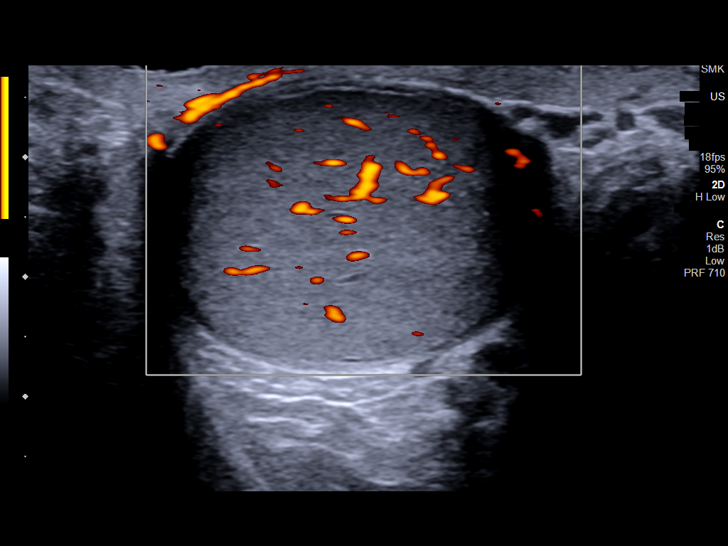
[im 52/84]
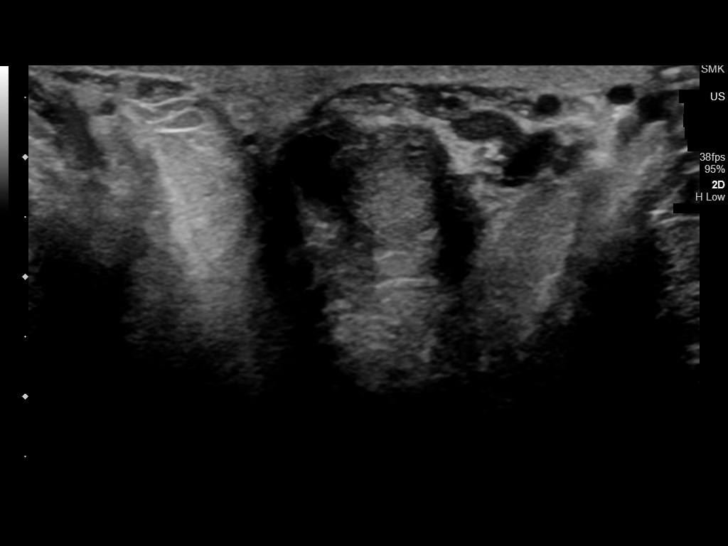
[im 56/84]
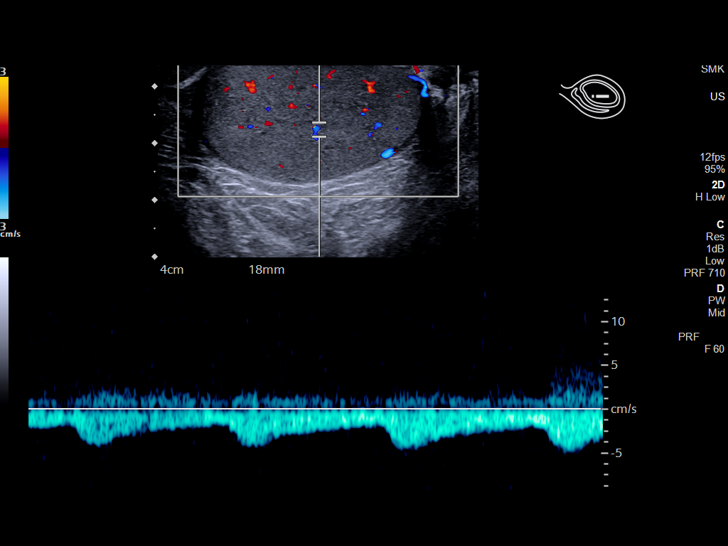
[im 63/84]
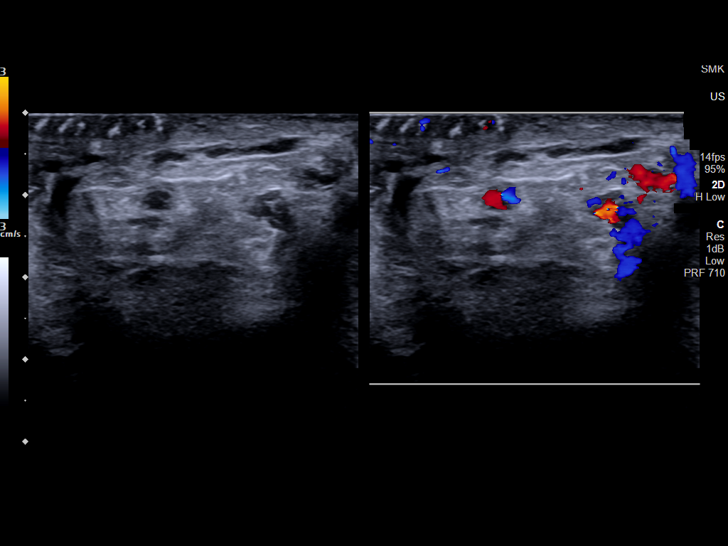
[im 70/84]
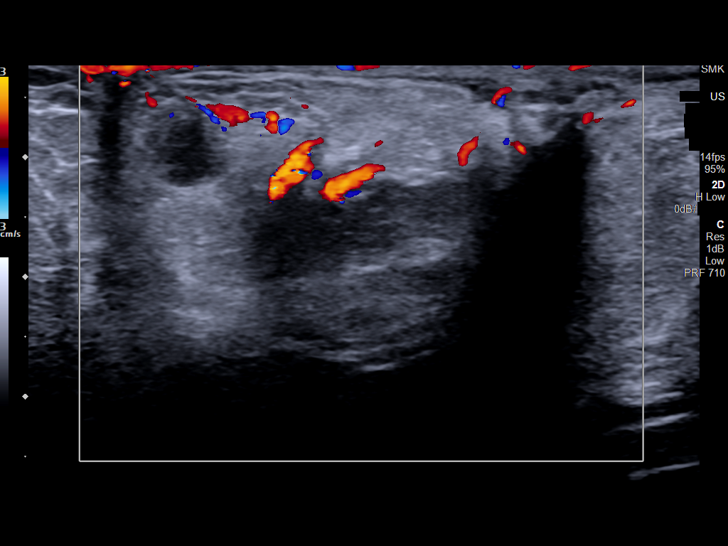
[im 77/84]
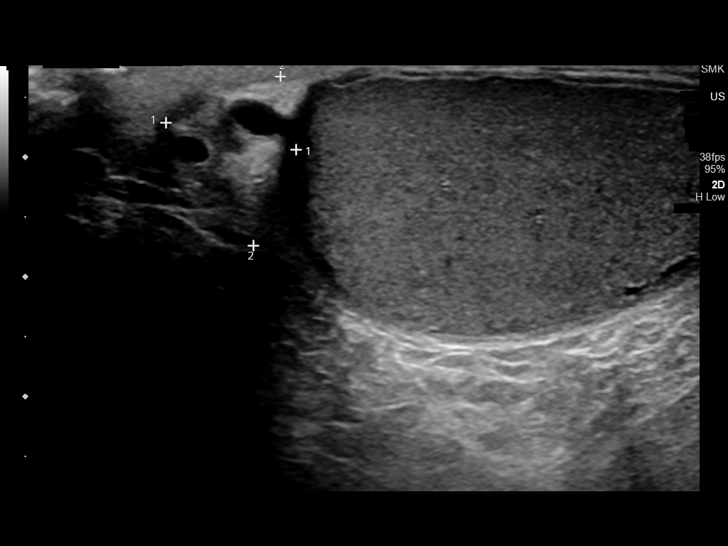
[im 84/84]
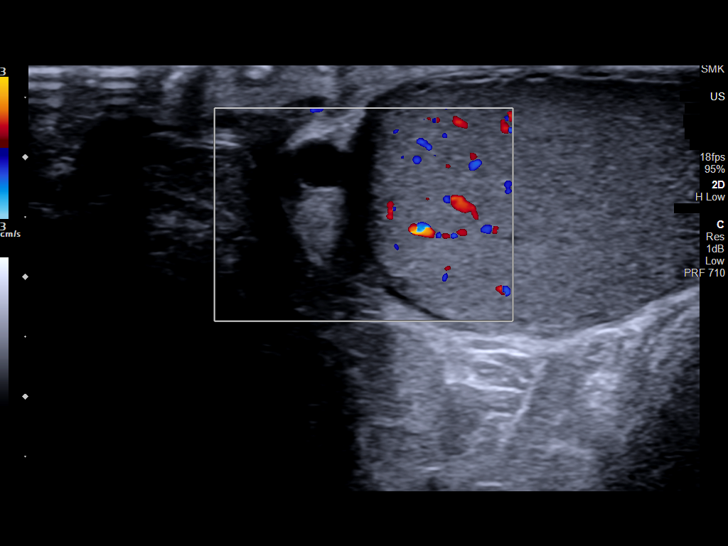

[14 of 25 positions shown; findings below may reference images not displayed]

FINDINGS: Right testicle

Measurements: 3.8 x 2.1 x 2.8 cm. No mass or microlithiasis
visualized.

Left testicle

Measurements: 4.2 x 2.3 x 3.2 cm. No mass or microlithiasis
visualized.

Right epididymis: Cyst or spermatocele with small internal debris
measuring 1.3 x 1.3 x 1.6 cm, grossly unchanged.

Left epididymis: Multiple small cysts or spermatoceles measuring up
to 7 x 3 x 5 mm.

Hydrocele:  None visualized.

Varicocele:  None visualized.

Pulsed Doppler interrogation of both testes demonstrates normal low
resistance arterial and venous waveforms bilaterally.
IMPRESSION: 1. Negative for testicular torsion or intratesticular mass lesion.
2. Bilateral epididymal cysts or spermatoceles.

## 2021-08-19 ENCOUNTER — Other Ambulatory Visit: Payer: Self-pay | Admitting: Urology

## 2021-08-19 ENCOUNTER — Other Ambulatory Visit: Payer: Self-pay | Admitting: Adult Health

## 2021-08-21 MED ORDER — LORAZEPAM 1 MG PO TABS: 1.0000 mg | ORAL_TABLET | Freq: Every day | ORAL | 0 refills | Status: AC | PRN

## 2021-08-21 MED ORDER — SERTRALINE HCL 25 MG PO TABS: 25.0000 mg | ORAL_TABLET | Freq: Every day | ORAL | 1 refills | Status: DC

## 2021-11-16 ENCOUNTER — Telehealth: Payer: Self-pay

## 2021-11-16 NOTE — Telephone Encounter (Signed)
Copied from CRM [id number]. Topic: General - Other >> Nov 16, 2021  4:25 PM [place] B wrote: Reason for GNF:[id number] called, and made appt for June 23 at 2pm. He has Rosann Auerbach bu t also wants to know if he will have copay with medicaid. Please call back

## 2021-11-18 NOTE — Telephone Encounter (Signed)
Called multiple times to patient.  His VM is full and cannot leave any messages.

## 2021-11-19 ENCOUNTER — Other Ambulatory Visit (HOSPITAL_COMMUNITY)
Admission: RE | Admit: 2021-11-19 | Discharge: 2021-11-19 | Disposition: A | Discharge status: Home / Self Care | Payer: Managed Care, Other (non HMO) | Source: Ambulatory Visit | Attending: Family Medicine | Admitting: Family Medicine

## 2021-11-19 ENCOUNTER — Ambulatory Visit (INDEPENDENT_AMBULATORY_CARE_PROVIDER_SITE_OTHER): Payer: Managed Care, Other (non HMO) | Admitting: Family Medicine

## 2021-11-19 ENCOUNTER — Encounter: Payer: Self-pay | Admitting: Family Medicine

## 2021-11-19 VITALS — BP 134/91 | HR 98 | Temp 98.3°F | Resp 16 | Ht 66.0 in | Wt 252.3 lb

## 2021-11-19 DIAGNOSIS — F419 Anxiety disorder, unspecified: Secondary | ICD-10-CM | POA: Insufficient documentation

## 2021-11-19 DIAGNOSIS — E1165 Type 2 diabetes mellitus with hyperglycemia: Secondary | ICD-10-CM | POA: Insufficient documentation

## 2021-11-19 DIAGNOSIS — F17210 Nicotine dependence, cigarettes, uncomplicated: Secondary | ICD-10-CM | POA: Insufficient documentation

## 2021-11-19 DIAGNOSIS — R300 Dysuria: Secondary | ICD-10-CM | POA: Insufficient documentation

## 2021-11-19 DIAGNOSIS — Z125 Encounter for screening for malignant neoplasm of prostate: Secondary | ICD-10-CM | POA: Insufficient documentation

## 2021-11-19 DIAGNOSIS — N411 Chronic prostatitis: Secondary | ICD-10-CM | POA: Insufficient documentation

## 2021-11-19 DIAGNOSIS — F1020 Alcohol dependence, uncomplicated: Secondary | ICD-10-CM | POA: Insufficient documentation

## 2021-11-19 LAB — POCT URINALYSIS DIPSTICK
Bilirubin, UA: NEGATIVE
Blood, UA: NEGATIVE
Glucose, UA: POSITIVE — AB
Ketones, UA: NEGATIVE
Leukocytes, UA: NEGATIVE
Nitrite, UA: NEGATIVE
Protein, UA: NEGATIVE
Spec Grav, UA: 1.015 (ref 1.010–1.025)
Urobilinogen, UA: 0.2 E.U./dL
pH, UA: 6 (ref 5.0–8.0)

## 2021-11-19 LAB — POCT GLYCOSYLATED HEMOGLOBIN (HGB A1C)
Est. average glucose Bld gHb Est-mCnc: 269
Hemoglobin A1C: 11 % — AB (ref 4.0–5.6)

## 2021-11-19 MED ORDER — METFORMIN HCL 500 MG PO TABS: 1000.0000 mg | ORAL_TABLET | Freq: Two times a day (BID) | ORAL | 1 refills | Status: AC

## 2021-11-19 MED ORDER — SITAGLIPTIN PHOSPHATE 100 MG PO TABS: 100.0000 mg | ORAL_TABLET | Freq: Every day | ORAL | 1 refills | Status: AC

## 2021-11-19 MED ORDER — SULFAMETHOXAZOLE-TRIMETHOPRIM 800-160 MG PO TABS: 1.0000 | ORAL_TABLET | Freq: Two times a day (BID) | ORAL | 0 refills | Status: DC

## 2021-11-19 MED ORDER — GLIPIZIDE 10 MG PO TABS: 10.0000 mg | ORAL_TABLET | Freq: Two times a day (BID) | ORAL | 3 refills | Status: AC

## 2021-11-19 MED ORDER — SERTRALINE HCL 25 MG PO TABS: 25.0000 mg | ORAL_TABLET | Freq: Every day | ORAL | 3 refills | Status: DC

## 2021-11-19 NOTE — Assessment & Plan Note (Signed)
Chronic, stable Was started on zoloft, wishes to continue Encourage routine use to assist with overall Anhedonia

## 2021-11-19 NOTE — Assessment & Plan Note (Signed)
POC negative; however, glucose positive Will send for STI cytology

## 2021-11-19 NOTE — Assessment & Plan Note (Signed)
Chronic, uncontrolled Has been on/off medication d/t to rehab stays for ETOH abuse A1c at 11% today; previously 8.9% BP borderline; however, pt continues to smoke cigarettes Recommend CMP and Lipid panel Urinary complaints present, hx of frequent sexual partners

## 2021-11-21 LAB — COMPREHENSIVE METABOLIC PANEL
ALT: 134 IU/L — ABNORMAL HIGH (ref 0–44)
AST: 67 IU/L — ABNORMAL HIGH (ref 0–40)
Albumin/Globulin Ratio: 1.5 (ref 1.2–2.2)
Albumin: 4.3 g/dL (ref 4.0–5.0)
Alkaline Phosphatase: 79 IU/L (ref 44–121)
BUN/Creatinine Ratio: 13 (ref 9–20)
BUN: 12 mg/dL (ref 6–24)
Bilirubin Total: 0.4 mg/dL (ref 0.0–1.2)
CO2: 19 mmol/L — ABNORMAL LOW (ref 20–29)
Calcium: 9.3 mg/dL (ref 8.7–10.2)
Chloride: 100 mmol/L (ref 96–106)
Creatinine, Ser: 0.92 mg/dL (ref 0.76–1.27)
Globulin, Total: 2.8 g/dL (ref 1.5–4.5)
Glucose: 429 mg/dL — ABNORMAL HIGH (ref 70–99)
Potassium: 4.3 mmol/L (ref 3.5–5.2)
Sodium: 138 mmol/L (ref 134–144)
Total Protein: 7.1 g/dL (ref 6.0–8.5)
eGFR: 107 mL/min/{1.73_m2} (ref >59)

## 2021-11-21 LAB — CBC WITH DIFFERENTIAL/PLATELET
Basophils Absolute: 0.1 10*3/uL (ref 0.0–0.2)
Basos: 1 %
EOS (ABSOLUTE): 0.1 10*3/uL (ref 0.0–0.4)
Eos: 1 %
Hematocrit: 47 % (ref 37.5–51.0)
Hemoglobin: 16 g/dL (ref 13.0–17.7)
Immature Grans (Abs): 0 10*3/uL (ref 0.0–0.1)
Immature Granulocytes: 1 %
Lymphocytes Absolute: 1.8 10*3/uL (ref 0.7–3.1)
Lymphs: 20 %
MCH: 31.3 pg (ref 26.6–33.0)
MCHC: 34 g/dL (ref 31.5–35.7)
MCV: 92 fL (ref 79–97)
Monocytes Absolute: 0.5 10*3/uL (ref 0.1–0.9)
Monocytes: 6 %
Neutrophils Absolute: 6.2 10*3/uL (ref 1.4–7.0)
Neutrophils: 71 %
Platelets: 187 10*3/uL (ref 150–450)
RBC: 5.11 x10E6/uL (ref 4.14–5.80)
RDW: 11.8 % (ref 11.6–15.4)
WBC: 8.7 10*3/uL (ref 3.4–10.8)

## 2021-11-21 LAB — LIPID PANEL
Chol/HDL Ratio: 5.3 ratio — ABNORMAL HIGH (ref 0.0–5.0)
Cholesterol, Total: 174 mg/dL (ref 100–199)
HDL: 33 mg/dL — ABNORMAL LOW (ref >39)
LDL Chol Calc (NIH): 104 mg/dL — ABNORMAL HIGH (ref 0–99)
Triglycerides: 210 mg/dL — ABNORMAL HIGH (ref 0–149)
VLDL Cholesterol Cal: 37 mg/dL (ref 5–40)

## 2021-11-21 LAB — MICROALBUMIN / CREATININE URINE RATIO
Creatinine, Urine: 65.1 mg/dL
Microalb/Creat Ratio: 7 mg/g creat (ref 0–29)
Microalbumin, Urine: 4.5 ug/mL

## 2021-11-21 LAB — PSA: Prostate Specific Ag, Serum: 1.1 ng/mL (ref 0.0–4.0)

## 2021-11-22 ENCOUNTER — Other Ambulatory Visit: Payer: Self-pay | Admitting: Family Medicine

## 2021-11-22 DIAGNOSIS — R7989 Other specified abnormal findings of blood chemistry: Secondary | ICD-10-CM | POA: Insufficient documentation

## 2021-11-22 DIAGNOSIS — F1020 Alcohol dependence, uncomplicated: Secondary | ICD-10-CM

## 2021-11-23 LAB — URINE CYTOLOGY ANCILLARY ONLY
Bacterial Vaginitis-Urine: NEGATIVE
Candida Urine: NEGATIVE
Chlamydia: NEGATIVE
Comment: NEGATIVE
Comment: NEGATIVE
Comment: NORMAL
Neisseria Gonorrhea: NEGATIVE
Trichomonas: NEGATIVE

## 2021-11-26 NOTE — Telephone Encounter (Signed)
Pt called Friday afternoon to check when his [place] appt was.  He ask if he could get this rescd for a Friday.  CB#  [phone]

## 2021-12-02 ENCOUNTER — Ambulatory Visit: Payer: Managed Care, Other (non HMO) | Attending: Family Medicine

## 2021-12-04 ENCOUNTER — Encounter: Payer: Self-pay | Admitting: Emergency Medicine

## 2021-12-04 ENCOUNTER — Other Ambulatory Visit: Payer: Self-pay

## 2021-12-04 ENCOUNTER — Emergency Department
Admission: EM | Admit: 2021-12-04 | Discharge: 2021-12-05 | Disposition: A | Discharge status: Home / Self Care | Payer: Managed Care, Other (non HMO) | Attending: Emergency Medicine | Admitting: Emergency Medicine

## 2021-12-04 DIAGNOSIS — K1120 Sialoadenitis, unspecified: Secondary | ICD-10-CM | POA: Insufficient documentation

## 2021-12-04 DIAGNOSIS — R6884 Jaw pain: Secondary | ICD-10-CM | POA: Diagnosis present

## 2021-12-04 MED ORDER — AMOXICILLIN-POT CLAVULANATE 875-125 MG PO TABS
1.0000 | ORAL_TABLET | Freq: Once | ORAL | Status: AC
  Administered 2021-12-05: 1 via ORAL
  Filled 2021-12-04: qty 1

## 2021-12-04 MED ORDER — AMOXICILLIN-POT CLAVULANATE 875-125 MG PO TABS: 1.0000 | ORAL_TABLET | Freq: Two times a day (BID) | ORAL | 0 refills | Status: AC

## 2021-12-04 MED ORDER — TRAMADOL HCL 50 MG PO TABS: 50.0000 mg | ORAL_TABLET | Freq: Two times a day (BID) | ORAL | 0 refills | Status: AC

## 2021-12-04 MED ORDER — TRAMADOL HCL 50 MG PO TABS
50.0000 mg | ORAL_TABLET | Freq: Once | ORAL | Status: AC
  Administered 2021-12-05: 50 mg via ORAL
  Filled 2021-12-04: qty 1

## 2021-12-04 NOTE — ED Provider Notes (Signed)
[facility] Emergency Department Provider Note     Event Date/Time   First MD Initiated Contact with Patient 12/04/21 2303     (approximate)   History   Dental Pain   HPI  Ronald Montgomery is a 41 y.o. male presents to the ED for evaluation of right jaw pain and pain around the right ear.  He reports being evaluated by local urgent care, treated for otitis externa with eardrops and doxycycline.  He presents today with ongoing pain and swelling to the right jaw and face.  He denies any fevers, chills, or sweats.   Physical Exam   Triage Vital Signs: ED Triage Vitals  Enc Vitals Group     BP 12/04/21 2219 (!) 152/105     Pulse Rate 12/04/21 2219 68     Resp 12/04/21 2219 16     Temp 12/04/21 2219 (!) 97.5 F (36.4 C)     Temp Source 12/04/21 2219 Oral     SpO2 12/04/21 2219 100 %     Weight 12/04/21 2222 250 lb (113.4 kg)     Height 12/04/21 2222 5\' 6"  (1.676 m)     Head Circumference --      Peak Flow --      Pain Score 12/04/21 2222 9     Pain Loc --      Pain Edu? --      Excl. in GC? --     Most recent vital signs: Vitals:   12/04/21 2219  BP: (!) 152/105  Pulse: 68  Resp: 16  Temp: (!) 97.5 F (36.4 C)  SpO2: 100%    General Awake, no distress.  HEENT NCAT.  With some fullness noted to the right face and angle of the jaw, concerning for possible parotid gland infection.  PERRL. EOMI. No rhinorrhea. Mucous membranes are moist.  Nose midline and tonsils are flat.  Oropharyngeal lesions are appreciated.  Patient with no focal gum swelling noted to the right upper mandible where he localizes some pain.  No brawny sublingual edema is appreciated. CV:  Good peripheral perfusion.  RESP:  Normal effort.  ABD:  No distention.    ED Results / Procedures / Treatments   Labs (all labs ordered are listed, but only abnormal results are displayed) Labs Reviewed - No data to display   EKG    RADIOLOGY   No results  found.   PROCEDURES:  Critical Care performed: No  Procedures   MEDICATIONS ORDERED IN ED: Medications  amoxicillin-clavulanate (AUGMENTIN) 875-125 MG per tablet 1 tablet (has no administration in time range)  traMADol (ULTRAM) tablet 50 mg (has no administration in time range)     IMPRESSION / MDM / ASSESSMENT AND PLAN / ED COURSE  I reviewed the triage vital signs and the nursing notes.                              Differential diagnosis includes, but is not limited to, AOM, otitis externa, parotitis, dental infection  Patient's presentation is most consistent with acute, uncomplicated illness.  Patient's diagnosis is consistent with parotitis. Patient will be discharged home with prescriptions for Augmentin and tramadol. Patient is to follow up with his primary provider as needed or otherwise directed. Patient is given ED precautions to return to the ED for any worsening or new symptoms.     FINAL CLINICAL IMPRESSION(S) / ED DIAGNOSES   Final diagnoses:  Salivary gland infection     Rx / DC Orders   ED Discharge Orders          Ordered    amoxicillin-clavulanate (AUGMENTIN) 875-125 MG tablet  2 times daily        12/04/21 2333    traMADol (ULTRAM) 50 MG tablet  2 times daily        12/04/21 2333             Note:  This document was prepared using Dragon voice recognition software and may include unintentional dictation errors.    Lissa Hoard, PA-C 12/04/21 2337    Georga Hacking, MD 12/06/21 [name]

## 2021-12-04 NOTE — ED Triage Notes (Signed)
Pt reports right upper tooth pain since Thursday, pt reports pain and swelling increased today. Pt talks in complete sentences no distress noted

## 2021-12-04 NOTE — Discharge Instructions (Addendum)
Take antibiotic as directed and the pain medicine as needed.  Follow-up with your primary provider for ongoing symptoms.

## 2021-12-04 NOTE — ED Triage Notes (Signed)
First RN Note: pt to ED via POV, c/o dental pain to R upper teeth. Pt states pain started 2 days ago. Pt states seen at [facility] and was given Doxy ear drops. Pt c/o continued pain and mild swelling to R face.

## 2021-12-05 DIAGNOSIS — K1120 Sialoadenitis, unspecified: Secondary | ICD-10-CM | POA: Diagnosis not present

## 2022-01-23 ENCOUNTER — Other Ambulatory Visit: Payer: Self-pay

## 2022-01-23 ENCOUNTER — Emergency Department
Admission: EM | Admit: 2022-01-23 | Discharge: 2022-01-23 | Disposition: A | Discharge status: Home / Self Care | Payer: Managed Care, Other (non HMO) | Attending: Emergency Medicine | Admitting: Emergency Medicine

## 2022-01-23 DIAGNOSIS — R300 Dysuria: Secondary | ICD-10-CM

## 2022-01-23 LAB — BASIC METABOLIC PANEL
Anion gap: 9 (ref 5–15)
BUN: 18 mg/dL (ref 6–20)
CO2: 22 mmol/L (ref 22–32)
Calcium: 8.7 mg/dL — ABNORMAL LOW (ref 8.9–10.3)
Chloride: 103 mmol/L (ref 98–111)
Creatinine, Ser: 0.87 mg/dL (ref 0.61–1.24)
GFR, Estimated: >60 mL/min (ref >60)
Glucose, Bld: 134 mg/dL — ABNORMAL HIGH (ref 70–99)
Potassium: 3.8 mmol/L (ref 3.5–5.1)
Sodium: 134 mmol/L — ABNORMAL LOW (ref 135–145)

## 2022-01-23 LAB — CBC WITH DIFFERENTIAL/PLATELET
Abs Immature Granulocytes: 0.06 10*3/uL (ref 0.00–0.07)
Basophils Absolute: 0.1 10*3/uL (ref 0.0–0.1)
Basophils Relative: 1 %
Eosinophils Absolute: 0.2 10*3/uL (ref 0.0–0.5)
Eosinophils Relative: 2 %
HCT: 44.8 % (ref 39.0–52.0)
Hemoglobin: 15 g/dL (ref 13.0–17.0)
Immature Granulocytes: 1 %
Lymphocytes Relative: 25 %
Lymphs Abs: 2.6 10*3/uL (ref 0.7–4.0)
MCH: 30.5 pg (ref 26.0–34.0)
MCHC: 33.5 g/dL (ref 30.0–36.0)
MCV: 91.2 fL (ref 80.0–100.0)
Monocytes Absolute: 0.7 10*3/uL (ref 0.1–1.0)
Monocytes Relative: 7 %
Neutro Abs: 6.8 10*3/uL (ref 1.7–7.7)
Neutrophils Relative %: 64 %
Platelets: 175 10*3/uL (ref 150–400)
RBC: 4.91 MIL/uL (ref 4.22–5.81)
RDW: 12.4 % (ref 11.5–15.5)
WBC: 10.4 10*3/uL (ref 4.0–10.5)
nRBC: 0 % (ref 0.0–0.2)

## 2022-01-23 LAB — CHLAMYDIA/NGC RT PCR (ARMC ONLY)
Chlamydia Tr: NOT DETECTED
N gonorrhoeae: NOT DETECTED

## 2022-01-23 LAB — URINALYSIS, ROUTINE W REFLEX MICROSCOPIC
Bacteria, UA: NONE SEEN
Bilirubin Urine: NEGATIVE
Glucose, UA: NEGATIVE mg/dL
Hgb urine dipstick: NEGATIVE
Ketones, ur: NEGATIVE mg/dL
Leukocytes,Ua: NEGATIVE
Nitrite: NEGATIVE
Protein, ur: 30 mg/dL — AB
Specific Gravity, Urine: 1.033 — ABNORMAL HIGH (ref 1.005–1.030)
pH: 5 (ref 5.0–8.0)

## 2022-01-23 MED ORDER — CEFTRIAXONE SODIUM 1 G IJ SOLR
500.0000 mg | Freq: Once | INTRAMUSCULAR | Status: AC
  Administered 2022-01-23: 500 mg via INTRAMUSCULAR
  Filled 2022-01-23: qty 10

## 2022-01-23 MED ORDER — STERILE WATER FOR INJECTION IJ SOLN
INTRAMUSCULAR | Status: AC
  Administered 2022-01-23: 1.4 mL
  Filled 2022-01-23: qty 10

## 2022-01-23 MED ORDER — DOXYCYCLINE HYCLATE 50 MG PO CAPS: 50.0000 mg | ORAL_CAPSULE | Freq: Two times a day (BID) | ORAL | 0 refills | Status: AC

## 2022-01-23 MED ORDER — DOXYCYCLINE HYCLATE 100 MG PO TABS
100.0000 mg | ORAL_TABLET | Freq: Once | ORAL | Status: AC
  Administered 2022-01-23: 100 mg via ORAL
  Filled 2022-01-23: qty 1

## 2022-01-23 MED ORDER — PHENAZOPYRIDINE HCL 100 MG PO TABS: 100.0000 mg | ORAL_TABLET | Freq: Three times a day (TID) | ORAL | 0 refills | Status: DC | PRN

## 2022-01-23 NOTE — ED Provider Notes (Signed)
[facility] Provider Note    Event Date/Time   First MD Initiated Contact with Patient 01/23/22 1113     (approximate)   History   Dysuria   HPI  Ronald Montgomery is a 41 y.o. male   Past medical history of alcohol use, prior sexually transmitted infection,Presents with dysuria and frequency.  Denies penile discharge.  Has had a new unprotected sexual partner over the last 2 weeks.  No scrotal or testicular pain.  He feels that he needs to urinate but then dribbles out very low volume each time.  Had no systemic symptoms and denies fevers, chills, nausea vomiting diarrhea.  Denies pain with defecation.  History was obtained via the patient and review of external medical records and notes.      Physical Exam   Triage Vital Signs: ED Triage Vitals  Enc Vitals Group     BP 01/23/22 0608 (!) 135/95     Pulse Rate 01/23/22 0608 75     Resp 01/23/22 0608 20     Temp 01/23/22 0608 98 F (36.7 C)     Temp Source 01/23/22 0608 Oral     SpO2 01/23/22 0608 100 %     Weight 01/23/22 0609 252 lb (114.3 kg)     Height 01/23/22 0609 5\' 6"  (1.676 m)     Head Circumference --      Peak Flow --      Pain Score 01/23/22 0615 4     Pain Loc --      Pain Edu? --      Excl. in GC? --     Most recent vital signs: Vitals:   01/23/22 0608 01/23/22 1206  BP: (!) 135/95 (!) 128/98  Pulse: 75 75  Resp: 20 18  Temp: 98 F (36.7 C)   SpO2: 100% 98%    General: Awake, no distress.  nonToxic appearance. CV:  Good peripheral perfusion.  Resp:  Normal effort.  Abd:  No distention.  nonTender. Other:  Testicles normal lie, not firm, nontender without scrotal changes.   ED Results / Procedures / Treatments   Labs (all labs ordered are listed, but only abnormal results are displayed) Labs Reviewed  URINALYSIS, ROUTINE W REFLEX MICROSCOPIC - Abnormal; Notable for the following components:      Result Value   Color, Urine AMBER (*)    APPearance HAZY (*)     Specific Gravity, Urine 1.033 (*)    Protein, ur 30 (*)    All other components within normal limits  BASIC METABOLIC PANEL - Abnormal; Notable for the following components:   Sodium 134 (*)    Glucose, Bld 134 (*)    Calcium 8.7 (*)    All other components within normal limits  CHLAMYDIA/NGC RT PCR ([facility] ONLY)            CBC WITH DIFFERENTIAL/PLATELET     I reviewed labs and they are notable for normal creatinine, no leukocytosis, negative leukocytes in the urine.    PROCEDURES: No Critical Care performed:   Procedures   MEDICATIONS ORDERED IN ED: Medications  cefTRIAXone (ROCEPHIN) injection 500 mg (500 mg Intramuscular Given 01/23/22 1200)  doxycycline (VIBRA-TABS) tablet 100 mg (100 mg Oral Given 01/23/22 1200)  sterile water (preservative free) injection (1.4 mLs  Given 01/23/22 1203)     IMPRESSION / MDM / ASSESSMENT AND PLAN / ED COURSE  I reviewed the triage vital signs and the nursing notes.  Differential diagnosis includes, but is not limited to, urinary tract infection, urinary retention, sexually transmitted infection, considered but less likely prostatitis, testicular torsion.    MDM: Postvoid residual is 0.  Patient is not in acute urinary retention and has normal creatinine.  Negative for urinary tract infection.  STI testing pending, patient elects for empiric treatment of traction and doxycycline.  Low suspicion of prostatitis given no pain in that area, patient declines a rectal exam at this time.  Very low suspicion of other intra-abdominal infectious process given benign abdominal exam.  Given Pyridium for bladder spasms.  Patient will follow-up with PMD this week and return with any worsening.    Patient's presentation is most consistent with acute complicated illness / injury requiring diagnostic workup.       FINAL CLINICAL IMPRESSION(S) / ED DIAGNOSES   Final diagnoses:  Dysuria     Rx / DC Orders   ED  Discharge Orders          Ordered    doxycycline (VIBRAMYCIN) 50 MG capsule  2 times daily        01/23/22 1149    phenazopyridine (PYRIDIUM) 100 MG tablet  3 times daily PRN        01/23/22 1149             Note:  This document was prepared using Dragon voice recognition software and may include unintentional dictation errors.    Pilar Jarvis, MD 01/23/22 2042

## 2022-01-23 NOTE — ED Triage Notes (Signed)
Pt reports painful urination and difficulty urinating. Reports concern for UTI or prostate enlargement.

## 2022-01-23 NOTE — ED Notes (Signed)
Dc ppw provided to pt. RX info and followup reviewed as applicable. Pt provides verbal consent for dc. Pt ambultory off unit to lobby.

## 2022-01-23 NOTE — ED Notes (Signed)
PVR 2mL

## 2022-01-23 NOTE — ED Provider Triage Note (Signed)
Emergency Medicine Provider Triage Evaluation Note  Ronald Montgomery , a 41 y.o. male  was evaluated in triage.  Pt complains of dysuria + new sexual contact - sugars have been 130s   Review of Systems  Positive: + dysuria  Negative: No fever   Physical Exam  BP (!) 135/95 (BP Location: Left Arm)   Pulse 75   Temp 98 F (36.7 C) (Oral)   Resp 20   Ht 5\' 6"  (1.676 m)   Wt 114.3 kg   SpO2 100%   BMI 40.67 kg/m  Gen:   Awake, no distress   Resp:  Normal effort  MSK:   Moves extremities without difficulty  Other:    Medical Decision Making  Medically screening exam initiated at 8:25 AM.  Appropriate orders placed.  was informed that the remainder of the evaluation will be completed by another provider, this initial triage assessment does not replace that evaluation, and the importance of remaining in the ED until their evaluation is complete.  Need room for GU exam-  I saw pt in the waiting room.    Luciano Cutter, MD 01/23/22 [phone]

## 2022-01-23 NOTE — Discharge Instructions (Signed)
Take antibiotics for full course as prescribed.   Take pyridium for bladder spasms as needed.  See your doctor this week to review testing results and check up.   If you have any worsening or new symptoms come back to the emergency department.

## 2022-02-16 ENCOUNTER — Ambulatory Visit (INDEPENDENT_AMBULATORY_CARE_PROVIDER_SITE_OTHER): Payer: Managed Care, Other (non HMO) | Admitting: Physician Assistant

## 2022-02-16 ENCOUNTER — Encounter: Payer: Self-pay | Admitting: Physician Assistant

## 2022-02-16 VITALS — BP 137/97 | HR 85 | Temp 98.2°F | Resp 20 | Wt 248.0 lb

## 2022-02-16 DIAGNOSIS — F17210 Nicotine dependence, cigarettes, uncomplicated: Secondary | ICD-10-CM

## 2022-02-16 DIAGNOSIS — Z6841 Body Mass Index (BMI) 40.0 and over, adult: Secondary | ICD-10-CM

## 2022-02-16 DIAGNOSIS — F419 Anxiety disorder, unspecified: Secondary | ICD-10-CM | POA: Diagnosis not present

## 2022-02-16 DIAGNOSIS — K6289 Other specified diseases of anus and rectum: Secondary | ICD-10-CM

## 2022-02-16 DIAGNOSIS — E1165 Type 2 diabetes mellitus with hyperglycemia: Secondary | ICD-10-CM | POA: Diagnosis not present

## 2022-02-16 DIAGNOSIS — F1020 Alcohol dependence, uncomplicated: Secondary | ICD-10-CM | POA: Diagnosis not present

## 2022-02-16 DIAGNOSIS — M545 Low back pain, unspecified: Secondary | ICD-10-CM

## 2022-02-16 LAB — POCT GLYCOSYLATED HEMOGLOBIN (HGB A1C)
Est. average glucose Bld gHb Est-mCnc: 160
Hemoglobin A1C: 7.2 % — AB (ref 4.0–5.6)

## 2022-02-16 NOTE — Progress Notes (Signed)
I,Roshena L Chambers,acting as a Education administrator for [employer], PA-C.,have documented all relevant documentation on the behalf of Mardene Speak, PA-C,as directed by  [employer], PA-C while in the presence of [employer], PA-C.   Established patient visit   Patient: Ronald Montgomery   DOB: [birth year]   41 y.o. Male  MRN: [id number] Visit Date: 02/16/2022  Today's healthcare provider: Mardene Speak, PA-C   Chief Complaint  Patient presents with   Diabetes   Foot Pain   Back Pain   Leg Pain   Subjective    HPI  Diabetes Mellitus Type II, Follow-up  Lab Results  Component Value Date   HGBA1C 11.0 (A) 11/19/2021   HGBA1C 8.9 (H) 05/04/2020   Wt Readings from Last 3 Encounters:  02/16/22 248 lb (112.5 kg)  01/23/22 252 lb (114.3 kg)  12/04/21 250 lb (113.4 kg)   Last seen for diabetes 11/19/2021.   Management since then includes recommending liver ultrasound due to elevated blood sugars. He reports poor compliance with treatment. Patient has not completed the ultrasound. This morning 177 Symptoms: Yes fatigue (feels exhausted)  No foot ulcerations  No appetite changes No nausea  No paresthesia of the feet  No polydipsia  No polyuria No visual disturbances   No vomiting     Home blood sugar records: fasting range: 185-225  Episodes of hypoglycemia? No    Current insulin regiment: none Most Recent Eye Exam: not UTD Current exercise: none Current diet habits: in general, an "unhealthy" diet, on average, 2 meals per day  Pertinent Labs: Lab Results  Component Value Date   CHOL 174 11/19/2021   HDL 33 (L) 11/19/2021   LDLCALC 104 (H) 11/19/2021   TRIG 210 (H) 11/19/2021   CHOLHDL 5.3 (H) 11/19/2021   Lab Results  Component Value Date   NA 134 (L) 01/23/2022   K 3.8 01/23/2022   CREATININE 0.87 01/23/2022   GFRNONAA >60 01/23/2022   MICROALBUR 50 06/03/2020   LABMICR 4.5 11/19/2021      ---------------------------------------------------------------------------------------------------  Foot pain: Patient complains of severe pain of the right foot. He also has achy pain in his legs, lower back pain that radiated into his buttocks. Patient has been ongoing for more than 2 months.   Alcohol abuse Drinks 2-3 beers x 1-2  weeks. Rehab completion? on February, 2023  HLD The 10-year ASCVD risk score (Arnett DK, et al., 2019) is: 12.3%   Values used to calculate the score:     Age: 35 years     Sex: Male     Is Non-Hispanic African American: No     Diabetic: Yes     Tobacco smoker: Yes     Systolic Blood Pressure: 462 mmHg     Is BP treated: No     HDL Cholesterol: 33 mg/dL     Total Cholesterol: 174 mg/dL  Medications: Outpatient Medications Prior to Visit  Medication Sig   blood glucose meter kit and supplies Dispense based on patient and insurance preference. Use up to four times daily as directed. (FOR ICD-10 E10.9, E11.9).   glipiZIDE (GLUCOTROL) 10 MG tablet Take 1 tablet (10 mg total) by mouth 2 (two) times daily before a meal.   LORazepam (ATIVAN) 1 MG tablet Take 1 tablet (1 mg total) by mouth daily as needed for anxiety.   metFORMIN (GLUCOPHAGE) 500 MG tablet Take 2 tablets (1,000 mg total) by mouth 2 (two) times daily with a meal.   sertraline (ZOLOFT) 25  MG tablet Take 1 tablet (25 mg total) by mouth daily.   sitaGLIPtin (JANUVIA) 100 MG tablet Take 1 tablet (100 mg total) by mouth daily.   [DISCONTINUED] phenazopyridine (PYRIDIUM) 100 MG tablet Take 1 tablet (100 mg total) by mouth 3 (three) times daily as needed for up to 30 doses for pain. (Patient not taking: Reported on 02/16/2022)   [DISCONTINUED] tadalafil (CIALIS) 5 MG tablet Take 1 tablet (5 mg total) by mouth daily as needed for erectile dysfunction. (Patient not taking: Reported on 02/16/2022)   No facility-administered medications prior to visit.    Review of Systems  Constitutional:  Positive  for fatigue. Negative for appetite change, chills and fever.  Respiratory:  Positive for shortness of breath. Negative for chest tightness and wheezing.   Cardiovascular:  Negative for chest pain and palpitations.  Gastrointestinal:  Negative for abdominal pain, nausea and vomiting.       Heart burn  Musculoskeletal:  Positive for back pain.       Right foot pain, leg  pain       Objective    BP (!) 137/97 (BP Location: Left Arm, Patient Position: Sitting, Cuff Size: Large)   Pulse 85   Temp 98.2 F (36.8 C) (Oral)   Resp 20   Wt 248 lb (112.5 kg)   SpO2 100% Comment: room air  BMI 40.03 kg/m    Today's Vitals   02/16/22 1314 02/16/22 1319  BP: (!) 126/95 (!) 137/97  Pulse: 80 85  Resp: 20   Temp: 98.2 F (36.8 C)   TempSrc: Oral   SpO2: 100%   Weight: 248 lb (112.5 kg)    Body mass index is 40.03 kg/m.    Physical Exam Vitals reviewed.  Constitutional:      General: He is not in acute distress.    Appearance: Normal appearance. He is not diaphoretic.  HENT:     Head: Normocephalic and atraumatic.  Eyes:     General: No scleral icterus.    Conjunctiva/sclera: Conjunctivae normal.  Cardiovascular:     Rate and Rhythm: Normal rate and regular rhythm.     Pulses: Normal pulses.     Heart sounds: Normal heart sounds. No murmur heard. Pulmonary:     Effort: Pulmonary effort is normal. No respiratory distress.     Breath sounds: Normal breath sounds. No wheezing or rhonchi.  Genitourinary:    Comments: Indurated skin nodule/ fold/anus No swelling, no redness, no tenderness present Musculoskeletal:     Cervical back: Neck supple.     Right lower leg: No edema.     Left lower leg: No edema.  Lymphadenopathy:     Cervical: No cervical adenopathy.  Skin:    General: Skin is warm and dry.     Findings: No rash.  Neurological:     Mental Status: He is alert and oriented to person, place, and time. Mental status is at baseline.  Psychiatric:        Behavior:  Behavior normal.        Thought Content: Thought content normal.        Judgment: Judgment normal.       No results found for any visits on 02/16/22.  Assessment & Plan     Nodule of anus /elongated/indurated skin fold Hemorrhoids vs skin tag vs hidradenitis suppurativa vs cancer? No rectal bleeding. No pain Last evaluation by GI for GERD was in 2022 Dietary modification with adequate fluid (generally ?2 L water per day) and  high fiber (25 to 35 g/day) is first-line, nonoperative therapy for symptomatic hemorrhoids. Fiber supplementation (psyllium husk) to complement die Stool softener or bulk-forming laxative to soften stool Sitz bath with warm water for pain relief Prep H PRN  Good anal hygiene, antibacterial soap Weight loss, smoking cessation and BS control recommended Will reassess in a week. Might refer to GI.  Anxiety/Depression PHQ9 score was 9    02/16/2022    2:13 PM 11/19/2021    1:59 PM 05/01/2020    1:43 PM  Depression screen PHQ 2/9  Decreased Interest 2 2 2   Down, Depressed, Hopeless 2 2 2   PHQ - 2 Score 4 4 4   Altered sleeping 3 3 3   Tired, decreased energy 2 3 3   Change in appetite 0 2 0  Feeling bad or failure about yourself  0 0 1  Trouble concentrating 0 2 0  Moving slowly or fidgety/restless 0 0 0  Suicidal thoughts 0 0 0  PHQ-9 Score 9 14 11   Difficult doing work/chores Somewhat difficult Not difficult at all Not difficult at all   Advised to increased his dose to 2 tablets and fu in 2 weeks  Chronic alcohol dependence, continuous (HCC) Encouraged cessation  Type 2 diabetes mellitus with hyperglycemia, without long-term current use of insulin (HCC)  - AMB referral to orthopedics - CBC with Differential/Platelet - Comprehensive metabolic panel - Lipid panel - POCT glycosylated hemoglobin (Hb A1C) was 7.2 Weight loss advised. Continue his current regimen Adherence to low carb diet and daily exercise advised.  Tobacco dependence due to  cigarettes Recommended smoking cessation  Body mass index (BMI) of 40.1-44.9 in adult (HCC) Weight loss of 5% of pt's current weight via healthy diet and daily exercise encouraged.    FU with PCP     The patient was advised to call back or seek an in-person evaluation if the symptoms worsen or if the condition fails to improve as anticipated.  I discussed the assessment and treatment plan with the patient. The patient was provided an opportunity to ask questions and all were answered. The patient agreed with the plan and demonstrated an understanding of the instructions.  The entirety of the information documented in the History of Present Illness, Review of Systems and Physical Exam were personally obtained by me. Portions of this information were initially documented by the CMA and reviewed by me for thoroughness and accuracy.  Portions of this note were created using dictation software and may contain typographical errors.       Total encounter time more than 30 minutes  Greater than 50% was spent in counseling and coordination of care with the patient    Elberta Leatherwood  [facility] [phone] (phone) [phone] (fax)  [place]

## 2022-02-17 LAB — CBC WITH DIFFERENTIAL/PLATELET
Basophils Absolute: 0.1 10*3/uL (ref 0.0–0.2)
Basos: 1 %
EOS (ABSOLUTE): 0.2 10*3/uL (ref 0.0–0.4)
Eos: 2 %
Hematocrit: 46.4 % (ref 37.5–51.0)
Hemoglobin: 16.2 g/dL (ref 13.0–17.7)
Immature Grans (Abs): 0.1 10*3/uL (ref 0.0–0.1)
Immature Granulocytes: 1 %
Lymphocytes Absolute: 1.9 10*3/uL (ref 0.7–3.1)
Lymphs: 20 %
MCH: 31.2 pg (ref 26.6–33.0)
MCHC: 34.9 g/dL (ref 31.5–35.7)
MCV: 89 fL (ref 79–97)
Monocytes Absolute: 0.6 10*3/uL (ref 0.1–0.9)
Monocytes: 6 %
Neutrophils Absolute: 6.9 10*3/uL (ref 1.4–7.0)
Neutrophils: 70 %
Platelets: 195 10*3/uL (ref 150–450)
RBC: 5.2 x10E6/uL (ref 4.14–5.80)
RDW: 12.3 % (ref 11.6–15.4)
WBC: 9.7 10*3/uL (ref 3.4–10.8)

## 2022-02-17 LAB — LIPID PANEL
Chol/HDL Ratio: 4.5 ratio (ref 0.0–5.0)
Cholesterol, Total: 161 mg/dL (ref 100–199)
HDL: 36 mg/dL — ABNORMAL LOW (ref >39)
LDL Chol Calc (NIH): 99 mg/dL (ref 0–99)
Triglycerides: 146 mg/dL (ref 0–149)
VLDL Cholesterol Cal: 26 mg/dL (ref 5–40)

## 2022-02-17 LAB — COMPREHENSIVE METABOLIC PANEL
ALT: 57 IU/L — ABNORMAL HIGH (ref 0–44)
AST: 39 IU/L (ref 0–40)
Albumin/Globulin Ratio: 1.5 (ref 1.2–2.2)
Albumin: 4.4 g/dL (ref 4.1–5.1)
Alkaline Phosphatase: 73 IU/L (ref 44–121)
BUN/Creatinine Ratio: 11 (ref 9–20)
BUN: 10 mg/dL (ref 6–24)
Bilirubin Total: 0.6 mg/dL (ref 0.0–1.2)
CO2: 21 mmol/L (ref 20–29)
Calcium: 9.4 mg/dL (ref 8.7–10.2)
Chloride: 99 mmol/L (ref 96–106)
Creatinine, Ser: 0.93 mg/dL (ref 0.76–1.27)
Globulin, Total: 3 g/dL (ref 1.5–4.5)
Glucose: 180 mg/dL — ABNORMAL HIGH (ref 70–99)
Potassium: 4.3 mmol/L (ref 3.5–5.2)
Sodium: 133 mmol/L — ABNORMAL LOW (ref 134–144)
Total Protein: 7.4 g/dL (ref 6.0–8.5)
eGFR: 105 mL/min/{1.73_m2} (ref >59)

## 2022-02-18 ENCOUNTER — Ambulatory Visit: Payer: Managed Care, Other (non HMO) | Admitting: Family Medicine

## 2022-02-18 ENCOUNTER — Telehealth: Payer: Self-pay

## 2022-02-18 ENCOUNTER — Other Ambulatory Visit: Payer: Self-pay | Admitting: Physician Assistant

## 2022-02-18 DIAGNOSIS — M5441 Lumbago with sciatica, right side: Secondary | ICD-10-CM

## 2022-02-18 DIAGNOSIS — M79673 Pain in unspecified foot: Secondary | ICD-10-CM

## 2022-02-18 DIAGNOSIS — M79606 Pain in leg, unspecified: Secondary | ICD-10-CM

## 2022-02-18 NOTE — Telephone Encounter (Signed)
Copied from [place] [id number]. Topic: Referral - Status >> Feb 18, 2022  1:30 PM Sabas Sous wrote: Reason for CRM: [facility] called requesting correction on referral diagnosis. Please advise

## 2022-02-23 NOTE — Progress Notes (Signed)
[facility] ,   Your labwork results all are back and stable for you except: Your good cholesterol still low.  Your blood sugar is 180 with A1C 7.2 vs 11 3 mo ago Your liver enzymes values improved from 3 mo ago.  No further tests need to be ordered.  If you have any questions please, feel free to address them with Daneil Dan during the fu appt  Best, Mardene Speak, PA-C

## 2022-02-23 NOTE — Progress Notes (Signed)
I,Sha'taria Tyson,acting as a Education administrator for Gwyneth Sprout, FNP.,have documented all relevant documentation on the behalf of Gwyneth Sprout, FNP,as directed by  Gwyneth Sprout, FNP while in the presence of Gwyneth Sprout, FNP.   Established patient visit   Patient: Ronald Montgomery   DOB: Mar 28, 1981   41 y.o. Male  MRN: [id number] Visit Date: 02/25/2022  Today's healthcare provider: Gwyneth Sprout, FNP  Re Introduced to nurse practitioner role and practice setting.  All questions answered.  Discussed provider/patient relationship and expectations.   No chief complaint on file.  Subjective    HPI  -Pstient is having foot, leg and back pain and was advised to visit [facility] but does not want to go there he would prefer to have a referral sent somewhere else -Patient declined flu vaccine and tobacco counseling Anxiety, Follow-up  He was last seen for anxiety 1 weeks ago. Changes made at last visit include continue medication.   He reports excellent compliance with treatment. He reports good tolerance of treatment. He is not having side effects.   He feels his anxiety is severe and Unchanged since last visit.  Symptoms: No chest pain No difficulty concentrating  No dizziness Yes fatigue  Yes feelings of losing control Yes insomnia  Yes irritable No palpitations  No panic attacks Yes racing thoughts  No shortness of breath No sweating  No tremors/shakes    GAD-7 Results    02/25/2022    2:07 PM  GAD-7 Generalized Anxiety Disorder Screening Tool  1. Feeling Nervous, Anxious, or on Edge 1  2. Not Being Able to Stop or Control Worrying 1  3. Worrying Too Much About Different Things 1  4. Trouble Relaxing 3  5. Being So Restless it's Hard To Sit Still 0  6. Becoming Easily Annoyed or Irritable 3  7. Feeling Afraid As If Something Awful Might Happen 1  Total GAD-7 Score 10  Difficulty At Work, Home, or Getting  Along With Others? Somewhat difficult    PHQ-9 Scores     02/25/2022    2:07 PM 02/16/2022    2:13 PM 11/19/2021    1:59 PM  PHQ9 SCORE ONLY  PHQ-9 Total Score 4 9 14     ---------------------------------------------------------------------------------------------------  -----------------------------------------------------------------------------------------   Medications: Outpatient Medications Prior to Visit  Medication Sig   blood glucose meter kit and supplies Dispense based on patient and insurance preference. Use up to four times daily as directed. (FOR ICD-10 E10.9, E11.9).   glipiZIDE (GLUCOTROL) 10 MG tablet Take 1 tablet (10 mg total) by mouth 2 (two) times daily before a meal.   LORazepam (ATIVAN) 1 MG tablet Take 1 tablet (1 mg total) by mouth daily as needed for anxiety.   metFORMIN (GLUCOPHAGE) 500 MG tablet Take 2 tablets (1,000 mg total) by mouth 2 (two) times daily with a meal.   sertraline (ZOLOFT) 25 MG tablet Take 1 tablet (25 mg total) by mouth daily.   sitaGLIPtin (JANUVIA) 100 MG tablet Take 1 tablet (100 mg total) by mouth daily.   No facility-administered medications prior to visit.    Review of Systems  Last CBC Lab Results  Component Value Date   WBC 9.7 02/16/2022   HGB 16.2 02/16/2022   HCT 46.4 02/16/2022   MCV 89 02/16/2022   MCH 31.2 02/16/2022   RDW 12.3 02/16/2022   PLT 195 83/66/2947   Last metabolic panel Lab Results  Component Value Date   GLUCOSE 180 (H) 02/16/2022  NA 133 (L) 02/16/2022   K 4.3 02/16/2022   CL 99 02/16/2022   CO2 21 02/16/2022   BUN 10 02/16/2022   CREATININE 0.93 02/16/2022   EGFR 105 02/16/2022   CALCIUM 9.4 02/16/2022   PROT 7.4 02/16/2022   ALBUMIN 4.4 02/16/2022   LABGLOB 3.0 02/16/2022   AGRATIO 1.5 02/16/2022   BILITOT 0.6 02/16/2022   ALKPHOS 73 02/16/2022   AST 39 02/16/2022   ALT 57 (H) 02/16/2022   ANIONGAP 9 01/23/2022   Last lipids Lab Results  Component Value Date   CHOL 161 02/16/2022   HDL 36 (L) 02/16/2022   LDLCALC 99 02/16/2022   TRIG  146 02/16/2022   CHOLHDL 4.5 02/16/2022   Last hemoglobin A1c Lab Results  Component Value Date   HGBA1C 7.2 (A) 02/16/2022   Last thyroid functions Lab Results  Component Value Date   TSH 2.790 05/04/2020   Last vitamin D Lab Results  Component Value Date   VD25OH 16.7 (L) 05/04/2020   Last vitamin B12 and Folate Lab Results  Component Value Date   VITAMINB12 883 05/04/2020       Objective    BP (!) 132/90 (BP Location: Left Arm, Patient Position: Sitting, Cuff Size: Large)   Temp (!) 77 F (25 C)   Ht 5' 6"  (1.676 m)   Wt 249 lb 6.4 oz (113.1 kg)   SpO2 98%   BMI 40.25 kg/m  BP Readings from Last 3 Encounters:  02/25/22 (!) 132/90  02/16/22 (!) 137/97  01/23/22 (!) 128/98   Wt Readings from Last 3 Encounters:  02/25/22 249 lb 6.4 oz (113.1 kg)  02/16/22 248 lb (112.5 kg)  01/23/22 252 lb (114.3 kg)   SpO2 Readings from Last 3 Encounters:  02/25/22 98%  02/16/22 100%  01/23/22 98%      Physical Exam Vitals and nursing note reviewed.  Constitutional:      Appearance: Normal appearance. He is obese.  HENT:     Head: Normocephalic and atraumatic.  Eyes:     Pupils: Pupils are equal, round, and reactive to light.  Cardiovascular:     Rate and Rhythm: Normal rate and regular rhythm.     Pulses: Normal pulses.     Heart sounds: Normal heart sounds.  Pulmonary:     Effort: Pulmonary effort is normal.     Breath sounds: Normal breath sounds.  Musculoskeletal:        General: Tenderness present. Normal range of motion.     Cervical back: Normal range of motion.     Comments: Unchanged pain of R lower back, R leg, and R foot  Skin:    General: Skin is warm and dry.     Capillary Refill: Capillary refill takes less than 2 seconds.  Neurological:     General: No focal deficit present.     Mental Status: He is alert and oriented to person, place, and time. Mental status is at baseline.  Psychiatric:        Mood and Affect: Mood normal.         Behavior: Behavior normal.        Thought Content: Thought content normal.        Judgment: Judgment normal.     No results found for any visits on 02/25/22.  Assessment & Plan     Problem List Items Addressed This Visit       Nervous and Auditory   Sciatica associated with disorder of lumbosacral spine -  Primary    Acute, recurrent symptoms Discussed referrals with previous OV; however, patient wishes to go to another office Recommend treatment for presumed sciatica Review of previous back imaging Multi focus medication to assist Encourage appt in 2 weeks with us/ortho to discuss progress      Relevant Medications   celecoxib (CELEBREX) 100 MG capsule   cyclobenzaprine (FLEXERIL) 10 MG tablet   gabapentin (NEURONTIN) 300 MG capsule   Other Relevant Orders   Ambulatory referral to Orthopedic Surgery     Other   Rectal pain    Acute, improving Reports no rectal bleeding Denies concerns for STI Encouraged to monitor- use sitz baths/warm towels, topical cream avoid straining      Relevant Medications   nystatin-triamcinolone ointment (MYCOLOG)     No follow-ups on file.     Vonna Kotyk, FNP, have reviewed all documentation for this visit. The documentation on 02/25/22 for the exam, diagnosis, procedures, and orders are all accurate and complete.    Gwyneth Sprout, [place] [phone] (phone) [phone] (fax)  [place]

## 2022-02-24 ENCOUNTER — Encounter: Payer: Self-pay | Admitting: *Deleted

## 2022-02-25 ENCOUNTER — Ambulatory Visit (INDEPENDENT_AMBULATORY_CARE_PROVIDER_SITE_OTHER): Payer: Managed Care, Other (non HMO) | Admitting: Family Medicine

## 2022-02-25 ENCOUNTER — Encounter: Payer: Self-pay | Admitting: Family Medicine

## 2022-02-25 VITALS — BP 132/90 | Temp 77.0°F | Ht 66.0 in | Wt 249.4 lb

## 2022-02-25 DIAGNOSIS — K6289 Other specified diseases of anus and rectum: Secondary | ICD-10-CM | POA: Diagnosis not present

## 2022-02-25 DIAGNOSIS — M5387 Other specified dorsopathies, lumbosacral region: Secondary | ICD-10-CM

## 2022-02-25 MED ORDER — GABAPENTIN 300 MG PO CAPS: 300.0000 mg | ORAL_CAPSULE | Freq: Three times a day (TID) | ORAL | 2 refills | Status: AC

## 2022-02-25 MED ORDER — NYSTATIN-TRIAMCINOLONE 100000-0.1 UNIT/GM-% EX OINT: 1.0000 | TOPICAL_OINTMENT | Freq: Every day | CUTANEOUS | 0 refills | Status: AC

## 2022-02-25 MED ORDER — CYCLOBENZAPRINE HCL 10 MG PO TABS: 10.0000 mg | ORAL_TABLET | Freq: Every day | ORAL | 2 refills | Status: AC

## 2022-02-25 MED ORDER — CELECOXIB 100 MG PO CAPS: 100.0000 mg | ORAL_CAPSULE | Freq: Two times a day (BID) | ORAL | 2 refills | Status: DC

## 2022-02-25 NOTE — Assessment & Plan Note (Signed)
Acute, recurrent symptoms Discussed referrals with previous OV; however, patient wishes to go to another office Recommend treatment for presumed sciatica Review of previous back imaging Multi focus medication to assist Encourage appt in 2 weeks with us/ortho to discuss progress

## 2022-02-25 NOTE — Assessment & Plan Note (Signed)
Acute, improving Reports no rectal bleeding Denies concerns for STI Encouraged to monitor- use sitz baths/warm towels, topical cream avoid straining

## 2022-03-22 NOTE — Progress Notes (Unsigned)
Established patient visit   Patient: Ronald Montgomery   DOB: [birth year]   41 y.o. Male  MRN: [id number] Visit Date: 03/23/2022  Today's healthcare provider: Gwyneth Sprout, FNP  Re Introduced to nurse practitioner role and practice setting.  All questions answered.  Discussed provider/patient relationship and expectations.   I,Jahmier Willadsen J Eoin Willden,acting as a scribe for Gwyneth Sprout, FNP.,have documented all relevant documentation on the behalf of Gwyneth Sprout, FNP,as directed by  Gwyneth Sprout, FNP while in the presence of Gwyneth Sprout, FNP.   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 Also has sore lymph nodes and legs starting a week ago.    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 Also has sore lymph nodes and legs starting a week ago.       Last edited by Smitty Knudsen, CMA on 03/23/2022  1:56 PM.      Medications: Outpatient Medications Prior to Visit  Medication Sig   blood glucose meter kit and supplies Dispense based on patient and insurance preference. Use up to four times daily as directed. (FOR ICD-10 E10.9, E11.9).   celecoxib (CELEBREX) 100 MG capsule Take 1 capsule (100 mg total) by mouth 2 (two) times daily.   cyclobenzaprine (FLEXERIL) 10 MG tablet Take 1 tablet (10 mg total) by mouth at bedtime.   gabapentin (NEURONTIN) 300 MG capsule Take 1 capsule (300 mg total) by mouth 3 (three) times daily.   glipiZIDE (GLUCOTROL) 10 MG tablet Take 1 tablet (10 mg total) by mouth 2 (two) times daily before a meal.   LORazepam (ATIVAN) 1 MG tablet Take 1 tablet (1 mg total) by mouth daily as needed for anxiety.   metFORMIN (GLUCOPHAGE) 500 MG tablet Take 2 tablets (1,000 mg total) by mouth 2 (two) times daily with a meal.   nystatin-triamcinolone ointment (MYCOLOG) Apply 1 Application topically at bedtime. Apply to  outside of rectum nightly to assist   sitaGLIPtin (JANUVIA) 100 MG tablet Take 1 tablet (100 mg total) by mouth daily.   [DISCONTINUED] sertraline (ZOLOFT) 25 MG tablet Take 1 tablet (25 mg total) by mouth daily.   No facility-administered medications prior to visit.    Review of Systems    Objective    BP (!) 139/106 (BP Location: Right Arm, Patient Position: Sitting, Cuff Size: Normal)   Pulse 95   Resp 16   Ht 5' 6" (1.676 m)   Wt 249 lb (112.9 kg)   SpO2 99%   BMI 40.19 kg/m   Physical Exam Vitals and nursing note reviewed.  Constitutional:      Appearance: Normal appearance. He is obese.  HENT:     Head: Normocephalic and atraumatic.  Eyes:     Pupils: Pupils are equal, round, and reactive to light.  Cardiovascular:     Rate and Rhythm: Normal rate and regular rhythm.     Pulses: Normal pulses.     Heart sounds: Normal heart sounds.  Pulmonary:     Effort: Pulmonary effort is normal.     Breath sounds: Normal breath sounds.  Musculoskeletal:        General: Normal range of motion.     Cervical back: Normal range of motion.  Skin:    General: Skin  is warm and dry.     Capillary Refill: Capillary refill takes less than 2 seconds.  Neurological:     General: No focal deficit present.     Mental Status: He is alert and oriented to person, place, and time. Mental status is at baseline.  Psychiatric:        Attention and Perception: Attention normal.        Mood and Affect: Mood is anxious and depressed. Affect is tearful.        Speech: Speech normal.        Behavior: Behavior normal. Behavior is cooperative.        Thought Content: Thought content normal. Thought content does not include homicidal or suicidal ideation. Thought content does not include homicidal or suicidal plan.        Cognition and Memory: Cognition normal. He exhibits impaired recent memory.        Judgment: Judgment normal.     Comments: Impaired memory s/s alcohol use disorder       No  results found for any visits on 03/23/22.  Assessment & Plan     Problem List Items Addressed This Visit       Cardiovascular and Mediastinum   Primary hypertension    New dx; recommend start of Hyzaar to assist       Relevant Medications   losartan-hydrochlorothiazide (HYZAAR) 100-25 MG tablet     Other   Chronic alcohol dependence, continuous (HCC) - Primary    Chronic, worsening Trial of 50-100 mg depade to assist Went to [place] meeting today Encouraged self care behaviors including walking, time with family/friends/children, music, etc to assist       Relevant Medications   naltrexone (DEPADE) 50 MG tablet   Depression, recurrent (HCC)    Chronic, worsening Has started drinking more/blacking out/having sexual contact with unknown parties Increase zoloft to 100 mg       Relevant Medications   sertraline (ZOLOFT) 100 MG tablet   High risk heterosexual behavior    Reports excess alcohol use and sexual activity  Request for 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      Vonna Kotyk, FNP, have reviewed all documentation for this visit. The documentation on 03/23/22 for the exam, diagnosis, procedures, and orders are all accurate and complete.    Gwyneth Sprout, [place] [phone] (phone) [phone] (fax)  [place]

## 2022-03-23 ENCOUNTER — Encounter: Payer: Self-pay | Admitting: Family Medicine

## 2022-03-23 ENCOUNTER — Ambulatory Visit (INDEPENDENT_AMBULATORY_CARE_PROVIDER_SITE_OTHER): Payer: Managed Care, Other (non HMO) | Admitting: Family Medicine

## 2022-03-23 VITALS — BP 139/106 | HR 95 | Resp 16 | Ht 66.0 in | Wt 249.0 lb

## 2022-03-23 DIAGNOSIS — F339 Major depressive disorder, recurrent, unspecified: Secondary | ICD-10-CM | POA: Diagnosis not present

## 2022-03-23 DIAGNOSIS — I10 Essential (primary) hypertension: Secondary | ICD-10-CM

## 2022-03-23 DIAGNOSIS — Z7251 High risk heterosexual behavior: Secondary | ICD-10-CM | POA: Diagnosis not present

## 2022-03-23 DIAGNOSIS — F1020 Alcohol dependence, uncomplicated: Secondary | ICD-10-CM

## 2022-03-23 MED ORDER — DOXYCYCLINE HYCLATE 100 MG PO TABS: 100.0000 mg | ORAL_TABLET | Freq: Two times a day (BID) | ORAL | 0 refills | Status: DC

## 2022-03-23 MED ORDER — CEFTRIAXONE SODIUM 500 MG IJ SOLR
500.0000 mg | Freq: Once | INTRAMUSCULAR | Status: AC
  Administered 2022-03-23: 500 mg via INTRAMUSCULAR

## 2022-03-23 MED ORDER — LOSARTAN POTASSIUM-HCTZ 100-25 MG PO TABS: 1.0000 | ORAL_TABLET | Freq: Every day | ORAL | 1 refills | Status: AC

## 2022-03-23 MED ORDER — NALTREXONE HCL 50 MG PO TABS: ORAL_TABLET | ORAL | 1 refills | Status: AC

## 2022-03-23 MED ORDER — SERTRALINE HCL 100 MG PO TABS: 100.0000 mg | ORAL_TABLET | Freq: Every day | ORAL | 1 refills | Status: AC

## 2022-03-23 NOTE — Assessment & Plan Note (Signed)
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

## 2022-03-23 NOTE — Assessment & Plan Note (Signed)
Chronic, worsening Trial of 50-100 mg depade to assist Went to [place] meeting today Encouraged self care behaviors including walking, time with family/friends/children, music, etc to assist

## 2022-03-23 NOTE — Assessment & Plan Note (Signed)
New dx; recommend start of Hyzaar to assist

## 2022-03-23 NOTE — Assessment & Plan Note (Signed)
Chronic, worsening Has started drinking more/blacking out/having sexual contact with unknown parties Increase zoloft to 100 mg

## 2022-04-05 ENCOUNTER — Encounter: Payer: Self-pay | Admitting: Family Medicine

## 2022-04-09 ENCOUNTER — Emergency Department
Admission: EM | Admit: 2022-04-09 | Discharge: 2022-04-09 | Disposition: A | Discharge status: Home / Self Care | Payer: Managed Care, Other (non HMO) | Attending: Emergency Medicine | Admitting: Emergency Medicine

## 2022-04-09 ENCOUNTER — Encounter: Payer: Self-pay | Admitting: Emergency Medicine

## 2022-04-09 ENCOUNTER — Other Ambulatory Visit: Payer: Self-pay

## 2022-04-09 DIAGNOSIS — I10 Essential (primary) hypertension: Secondary | ICD-10-CM | POA: Diagnosis not present

## 2022-04-09 DIAGNOSIS — R6884 Jaw pain: Secondary | ICD-10-CM | POA: Insufficient documentation

## 2022-04-09 DIAGNOSIS — R591 Generalized enlarged lymph nodes: Secondary | ICD-10-CM | POA: Diagnosis not present

## 2022-04-09 LAB — GROUP A STREP BY PCR: Group A Strep by PCR: NOT DETECTED

## 2022-04-09 MED ORDER — AMOXICILLIN-POT CLAVULANATE 875-125 MG PO TABS
1.0000 | ORAL_TABLET | Freq: Once | ORAL | Status: AC
  Administered 2022-04-09: 1 via ORAL
  Filled 2022-04-09: qty 1

## 2022-04-09 MED ORDER — TRAMADOL HCL 50 MG PO TABS
50.0000 mg | ORAL_TABLET | Freq: Once | ORAL | Status: AC
  Administered 2022-04-09: 50 mg via ORAL
  Filled 2022-04-09: qty 1

## 2022-04-09 MED ORDER — AMOXICILLIN-POT CLAVULANATE 875-125 MG PO TABS
1.0000 | ORAL_TABLET | Freq: Two times a day (BID) | ORAL | 0 refills | Status: AC
Start: 2022-04-09 — End: 2022-04-19

## 2022-04-09 NOTE — ED Triage Notes (Signed)
Pt to ER with c/o right jaw pain that started 2 nights ago with what felt like a lymph node.  Pt reports pain moved into jaw last night and is increased.

## 2022-04-09 NOTE — Discharge Instructions (Signed)
Take the prescription antibiotic as directed. Follow-up with your primary provider for continued symptoms.

## 2022-04-09 NOTE — ED Provider Notes (Signed)
    [facility] Emergency Department Provider Note     Event Date/Time   First MD Initiated Contact with Patient 04/09/22 1506     (approximate)   History   Jaw Pain   HPI  Ronald Montgomery is a 40 y.o. male with a history of chronic alcohol use disorder, hypertension, and HSV, presents to the ED for right-sided jaw pain.  Patient reports onset of symptoms 2 nights ago with what he describes as a tender lymph node.     Physical Exam   Triage Vital Signs: ED Triage Vitals  Enc Vitals Group     BP 04/09/22 1512 124/84     Pulse Rate 04/09/22 1512 88     Resp 04/09/22 1512 16     Temp 04/09/22 1512 97.8 F (36.6 C)     Temp Source 04/09/22 1512 Oral     SpO2 04/09/22 1512 98 %     Weight 04/09/22 1501 250 lb (113.4 kg)     Height 04/09/22 1501 5\' 6"  (1.676 m)     Head Circumference --      Peak Flow --      Pain Score 04/09/22 1501 10     Pain Loc --      Pain Edu? --      Excl. in [place]? --     Most recent vital signs: Vitals:   04/09/22 1512  BP: 124/84  Pulse: 88  Resp: 16  Temp: 97.8 F (36.6 C)  SpO2: 98%    General Awake, no distress. *** {**HEENT NCAT. PERRL. EOMI. No rhinorrhea. Mucous membranes are moist. **} CV:  Good peripheral perfusion. *** RESP:  Normal effort. *** ABD:  No distention. *** {**Other: **}   ED Results / Procedures / Treatments   Labs (all labs ordered are listed, but only abnormal results are displayed) Labs Reviewed - No data to display   EKG  ***  RADIOLOGY  {**I personally viewed and evaluated these images as part of my medical decision making, as well as reviewing the written report by the radiologist.  ED Provider Interpretation: ***  No results found.   PROCEDURES:  Critical Care performed: {CriticalCareYesNo:19197::"Yes, see critical care procedure note(s)","No"}  Procedures   MEDICATIONS ORDERED IN ED: Medications - No data to display   IMPRESSION / MDM / ASSESSMENT AND  PLAN / ED COURSE  I reviewed the triage vital signs and the nursing notes.                              Differential diagnosis includes, but is not limited to, ***  Patient's presentation is most consistent with {EM COPA:27473}  {**The patient is on the cardiac monitor to evaluate for evidence of arrhythmia and/or significant heart rate changes.**}  Patient's diagnosis is consistent with ***. Patient will be discharged home with prescriptions for ***. Patient is to follow up with *** as needed or otherwise directed. Patient is given ED precautions to return to the ED for any worsening or new symptoms.     FINAL CLINICAL IMPRESSION(S) / ED DIAGNOSES   Final diagnoses:  None     Rx / DC Orders   ED Discharge Orders     None        Note:  This document was prepared using Dragon voice recognition software and may include unintentional dictation errors.

## 2022-04-14 ENCOUNTER — Telehealth: Payer: Self-pay | Admitting: Family Medicine

## 2022-04-14 NOTE — Telephone Encounter (Signed)
[facility] [address] [phone]  Pharmacy is calling- Patient had dental procedure and was given Rx for Oxycodone- pain control. Patient has naltrexone on active list and this is coming up with warning. Pharmacy would like to know if the pain medication should be filled.   Please call and let them know- or is there alternative?

## 2022-04-27 ENCOUNTER — Ambulatory Visit: Payer: Managed Care, Other (non HMO) | Admitting: Family Medicine

## 2022-04-29 ENCOUNTER — Other Ambulatory Visit: Payer: Self-pay | Admitting: Orthopedic Surgery

## 2022-04-29 DIAGNOSIS — M5387 Other specified dorsopathies, lumbosacral region: Secondary | ICD-10-CM

## 2022-05-18 ENCOUNTER — Ambulatory Visit
Admission: RE | Admit: 2022-05-18 | Discharge: 2022-05-18 | Disposition: A | Discharge status: Home / Self Care | Payer: Managed Care, Other (non HMO) | Source: Ambulatory Visit | Attending: Orthopedic Surgery | Admitting: Orthopedic Surgery

## 2022-05-18 DIAGNOSIS — M5387 Other specified dorsopathies, lumbosacral region: Secondary | ICD-10-CM

## 2022-07-20 ENCOUNTER — Emergency Department
Admission: EM | Admit: 2022-07-20 | Discharge: 2022-07-20 | Disposition: A | Discharge status: Home / Self Care | Payer: Managed Care, Other (non HMO) | Attending: Emergency Medicine | Admitting: Emergency Medicine

## 2022-07-20 ENCOUNTER — Emergency Department: Payer: Managed Care, Other (non HMO)

## 2022-07-20 DIAGNOSIS — E119 Type 2 diabetes mellitus without complications: Secondary | ICD-10-CM | POA: Diagnosis not present

## 2022-07-20 DIAGNOSIS — F1990 Other psychoactive substance use, unspecified, uncomplicated: Secondary | ICD-10-CM

## 2022-07-20 DIAGNOSIS — Y908 Blood alcohol level of 240 mg/100 ml or more: Secondary | ICD-10-CM | POA: Insufficient documentation

## 2022-07-20 DIAGNOSIS — R4182 Altered mental status, unspecified: Secondary | ICD-10-CM | POA: Insufficient documentation

## 2022-07-20 DIAGNOSIS — F10129 Alcohol abuse with intoxication, unspecified: Secondary | ICD-10-CM | POA: Insufficient documentation

## 2022-07-20 DIAGNOSIS — F10929 Alcohol use, unspecified with intoxication, unspecified: Secondary | ICD-10-CM

## 2022-07-20 DIAGNOSIS — R451 Restlessness and agitation: Secondary | ICD-10-CM | POA: Diagnosis present

## 2022-07-20 LAB — COMPREHENSIVE METABOLIC PANEL
ALT: 60 U/L — ABNORMAL HIGH (ref 0–44)
AST: 40 U/L (ref 15–41)
Albumin: 4.3 g/dL (ref 3.5–5.0)
Alkaline Phosphatase: 70 U/L (ref 38–126)
Anion gap: 12 (ref 5–15)
BUN: 19 mg/dL (ref 6–20)
CO2: 23 mmol/L (ref 22–32)
Calcium: 8.7 mg/dL — ABNORMAL LOW (ref 8.9–10.3)
Chloride: 98 mmol/L (ref 98–111)
Creatinine, Ser: 1.07 mg/dL (ref 0.61–1.24)
GFR, Estimated: >60 mL/min (ref >60)
Glucose, Bld: 241 mg/dL — ABNORMAL HIGH (ref 70–99)
Potassium: 3.3 mmol/L — ABNORMAL LOW (ref 3.5–5.1)
Sodium: 133 mmol/L — ABNORMAL LOW (ref 135–145)
Total Bilirubin: 0.5 mg/dL (ref 0.3–1.2)
Total Protein: 8.2 g/dL — ABNORMAL HIGH (ref 6.5–8.1)

## 2022-07-20 LAB — CBC WITH DIFFERENTIAL/PLATELET
Abs Immature Granulocytes: 0.08 10*3/uL — ABNORMAL HIGH (ref 0.00–0.07)
Basophils Absolute: 0.1 10*3/uL (ref 0.0–0.1)
Basophils Relative: 1 %
Eosinophils Absolute: 0 10*3/uL (ref 0.0–0.5)
Eosinophils Relative: 0 %
HCT: 48.6 % (ref 39.0–52.0)
Hemoglobin: 17.9 g/dL — ABNORMAL HIGH (ref 13.0–17.0)
Immature Granulocytes: 1 %
Lymphocytes Relative: 26 %
Lymphs Abs: 3.5 10*3/uL (ref 0.7–4.0)
MCH: 33.3 pg (ref 26.0–34.0)
MCHC: 36.8 g/dL — ABNORMAL HIGH (ref 30.0–36.0)
MCV: 90.5 fL (ref 80.0–100.0)
Monocytes Absolute: 0.8 10*3/uL (ref 0.1–1.0)
Monocytes Relative: 6 %
Neutro Abs: 9.1 10*3/uL — ABNORMAL HIGH (ref 1.7–7.7)
Neutrophils Relative %: 66 %
Platelets: 228 10*3/uL (ref 150–400)
RBC: 5.37 MIL/uL (ref 4.22–5.81)
RDW: 12.5 % (ref 11.5–15.5)
WBC: 13.5 10*3/uL — ABNORMAL HIGH (ref 4.0–10.5)
nRBC: 0 % (ref 0.0–0.2)

## 2022-07-20 LAB — CK: Total CK: 680 U/L — ABNORMAL HIGH (ref 49–397)

## 2022-07-20 LAB — SALICYLATE LEVEL: Salicylate Lvl: <7 mg/dL — ABNORMAL LOW (ref 7.0–30.0)

## 2022-07-20 LAB — URINE DRUG SCREEN, QUALITATIVE (ARMC ONLY)
Amphetamines, Ur Screen: NOT DETECTED
Barbiturates, Ur Screen: NOT DETECTED
Benzodiazepine, Ur Scrn: POSITIVE — AB
Cannabinoid 50 Ng, Ur ~~LOC~~: NOT DETECTED
Cocaine Metabolite,Ur ~~LOC~~: POSITIVE — AB
MDMA (Ecstasy)Ur Screen: NOT DETECTED
Methadone Scn, Ur: NOT DETECTED
Opiate, Ur Screen: NOT DETECTED
Phencyclidine (PCP) Ur S: NOT DETECTED
Tricyclic, Ur Screen: NOT DETECTED

## 2022-07-20 LAB — ETHANOL: Alcohol, Ethyl (B): 284 mg/dL — ABNORMAL HIGH (ref <10)

## 2022-07-20 LAB — ACETAMINOPHEN LEVEL: Acetaminophen (Tylenol), Serum: <10 ug/mL — ABNORMAL LOW (ref 10–30)

## 2022-07-20 LAB — CBG MONITORING, ED: Glucose-Capillary: 246 mg/dL — ABNORMAL HIGH (ref 70–99)

## 2022-07-20 MED ORDER — DROPERIDOL 2.5 MG/ML IJ SOLN
5.0000 mg | Freq: Once | INTRAMUSCULAR | Status: AC
  Administered 2022-07-20: 5 mg via INTRAMUSCULAR

## 2022-07-20 NOTE — ED Notes (Signed)
Pt's mother called to leave # [phone] wants  to hear something as soon as possible she is 3 hrs away

## 2022-07-20 NOTE — ED Notes (Signed)
Called lab for update on result of ethanol level. Lab states that the label was not sent down and that they would start the lab now. States that it will result in 5-10 minutes.

## 2022-07-20 NOTE — ED Triage Notes (Addendum)
To room 2 via [facility] with c/o intoxication. Per EMS, wife called [employer], stating pt had been missing since possibly Monday. Pt found behind apartment complex this morning, intoxicated and belligerent with PD and EMS, requiring 32m Haldol and 536mVersed en route. PD accompanied pt for EMS transport.  Upon arrival to room pt thrashing about stretcher, yelling, belligerent and cursing to staff MD WoJacelyn Gript bedside at this time. Majority of triage unable to be complete due to pt current condition and unwillingness to cooperate

## 2022-07-20 NOTE — ED Provider Notes (Signed)
-----------------------------------------   7:53 AM on 07/20/2022 ----------------------------------------- Patient care assumed from Dr. Jacelyn Grip.  Patient now sleeping comfortably, on 2 L nasal cannula satting 96%.  Labs have begun to result showing a urine drug screen positive for cocaine.  Acetaminophen and salicylate are negative.  Slight leukocytosis of 13,000 on his CBC with some hemoconcentration hemoglobin 17.9.  Chemistry is pending, ethanol is pending.  CT scan of the head shows no acute abnormality.  We will continue to closely monitor.  ----------------------------------------- 1:46 PM on 07/20/2022 ----------------------------------------- Patient remains somnolent but will awaken to voice will briefly answer questions before falling back asleep.  Patient's mother was here with the patient had discussion with the mother but did not discuss the patient's results as he was not awake enough to give permission.  Mom is concerned due to the patient's alcoholism and prior drug use believe the patient needs to go to rehab, discussed with mom once the patient is awake and alert if he wishes to go to rehab/detox we will discuss with TTS for possible options, however the patient is not willing to go voluntarily we cannot force him.  We will continue to monitor the patient in the emergency department.  Mother states she will return to pick the patient up when he is ready to go home.  Patient's lab work has resulted showing cocaine positive toxicology.  Patient's CK is mildly elevated, chemistry shows no concerning findings, CBC shows mild leukocytosis and hemoconcentration.  Alcohol level of 284 at 6:40 AM.   Harvest Dark, MD 07/20/22 1500

## 2022-07-20 NOTE — ED Provider Notes (Signed)
Procedures     ----------------------------------------- 5:40 PM on 07/20/2022 ----------------------------------------- Patient now awake, oriented, clear speech, steady gait, tolerating p.o.  He is clinically sober.  His mother is here to take him home.  He is appropriate for discharge     Carrie Mew, MD 07/20/22 1740

## 2022-07-20 NOTE — ED Notes (Signed)
Patient transported to CT 

## 2022-07-20 NOTE — ED Provider Notes (Addendum)
[facility] Provider Note    Event Date/Time   First MD Initiated Contact with Patient 07/20/22 [phone]     (approximate)   History   Alcohol Intoxication   HPI  Ronald Montgomery is a 42 y.o. male   Past medical history of alcohol use, diabetes, who presents to the emergency department with alcohol intoxication.  Police found him behind some apartments outside [place] down, he could became combative and agitated striking medics en route.  He got 5 IM Haldol and 5 IM Versed prior to arrival.  EMS reports alcohol use.  When he arrives to the emergency department he is agitated combative slurring his speech striking out at our personnel.  It had been approximately 20 minutes since his IM medications were administered and he continued to be combative agitated and striking staff.  Ordered for an additional 5 of IM droperidol to facilitate patient and staff safety as well as medical evaluation.    Independent Historian contributed to assessment above: EMS       Physical Exam   Triage Vital Signs: ED Triage Vitals [07/20/22 0640]  Enc Vitals Group     BP      Pulse      Resp      Temp      Temp src      SpO2      Weight 250 lb (113.4 kg)     Height 5' 6"$  (1.676 m)     Head Circumference      Peak Flow      Pain Score      Pain Loc      Pain Edu?      Excl. in [place]?     Most recent vital signs: There were no vitals filed for this visit.  General: Awake, alert, slurred speech moving all extremities, sweatshirt is damp and skin feels cool CV:  Good peripheral perfusion Resp:  Normal effort.  Abd:  No distention.  Nontender Other:  No obvious trauma to the head torso abdomen or extremities.  He has dried blood in his nares.   ED Results / Procedures / Treatments   Labs (all labs ordered are listed, but only abnormal results are displayed) Labs Reviewed  CBG MONITORING, ED - Abnormal; Notable for the following components:      Result Value    Glucose-Capillary 246 (*)    All other components within normal limits  ACETAMINOPHEN LEVEL  COMPREHENSIVE METABOLIC PANEL  ETHANOL  SALICYLATE LEVEL  CBC WITH DIFFERENTIAL/PLATELET  CK  URINE DRUG SCREEN, QUALITATIVE ([place])      PROCEDURES:  Critical Care performed: No   ED ECG REPORT I, Lucillie Garfinkel, the attending physician, personally viewed and interpreted this ECG.   Date: 07/20/2022  EKG Time: 0654  Rate: 95  Rhythm: nsr  Axis: nl  Intervals:LAFB; long QTC 490s  ST&T Change: no acute ischemic changes  Procedures   MEDICATIONS ORDERED IN ED: Medications  droperidol (INAPSINE) 2.5 MG/ML injection 5 mg (5 mg Intramuscular Given 07/20/22 0643)     IMPRESSION / MDM / ASSESSMENT AND PLAN / ED COURSE  I reviewed the triage vital signs and the nursing notes.                                Patient's presentation is most consistent with acute presentation with potential threat to life or bodily function.  Differential diagnosis includes,  but is not limited to, intoxication, drug intoxication, toxidrome, head bleeding, infection, metabolic derangement, rhabdomyolysis, hypothermia   The patient is on the cardiac monitor to evaluate for evidence of arrhythmia and/or significant heart rate changes.  MDM: Patient with alcohol use and reported alcohol intoxication today found down outside, get basic labs and tox workup as well as a CT scan of the head and a CK.  Patient is agitated combative striking at our staff requiring IM sedatives to facilitate staff and patient's safety as well as workup.  He came in towards the change of shift, I ordered the above workup which is pending at signout.         FINAL CLINICAL IMPRESSION(S) / ED DIAGNOSES   Final diagnoses:  Altered mental status, unspecified altered mental status type     Rx / DC Orders   ED Discharge Orders     None        Note:  This document was prepared using Dragon voice recognition  software and may include unintentional dictation errors.    Lucillie Garfinkel, MD 07/20/22 [id number]    Lucillie Garfinkel, MD 07/20/22 [id number]

## 2022-07-20 NOTE — ED Notes (Signed)
Pt awake and requesting to be discharged. MD notified

## 2022-07-20 NOTE — ED Notes (Signed)
Pt given water.

## 2022-07-20 NOTE — ED Notes (Signed)
Pt verbalizes understanding of discharge instructions. Opportunity for questioning and answers were provided. Pt discharged from ED to home with mother.   ? ?

## 2022-07-20 NOTE — ED Notes (Signed)
Pt resting in bed with door open.

## 2022-07-20 NOTE — ED Notes (Signed)
MD and mother at bedside. Pt requesting to go home.

## 2022-08-01 ENCOUNTER — Telehealth: Payer: Self-pay

## 2022-08-01 NOTE — Transitions of Care (Post Inpatient/ED Visit) (Signed)
   08/01/2022  Name: Ronald Montgomery MRN: [id number] DOB: 02-11-1981  Today's TOC FU Call Status: Today's TOC FU Call Status:: Unsuccessul Call (1st Attempt) Unsuccessful Call (1st Attempt) Date: 08/01/22  Attempted to reach the patient regarding the most recent Inpatient/ED visit.  Follow Up Plan: Additional outreach attempts will be made to reach the patient to complete the Transitions of Care (Post Inpatient/ED visit) call.   Signature Stephan Minister, [place]

## 2022-11-24 ENCOUNTER — Other Ambulatory Visit: Payer: Self-pay

## 2022-11-24 ENCOUNTER — Emergency Department
Admission: EM | Admit: 2022-11-24 | Discharge: 2022-11-24 | Disposition: A | Discharge status: Home / Self Care | Payer: Managed Care, Other (non HMO) | Attending: Emergency Medicine | Admitting: Emergency Medicine

## 2022-11-24 DIAGNOSIS — M546 Pain in thoracic spine: Secondary | ICD-10-CM | POA: Insufficient documentation

## 2022-11-24 DIAGNOSIS — Z5321 Procedure and treatment not carried out due to patient leaving prior to being seen by health care provider: Secondary | ICD-10-CM | POA: Insufficient documentation

## 2022-11-24 NOTE — ED Notes (Signed)
3rd attempt to call patient back to room. No answer from lobby.

## 2022-11-24 NOTE — ED Notes (Signed)
Called pt x1 to bring to treatment rm, no response.

## 2022-11-24 NOTE — ED Notes (Signed)
Patient called for 2nd time with no answer from lobby.

## 2022-11-24 NOTE — ED Triage Notes (Signed)
Pt to ED for mid back pain started 2 days ago. Denies injuries. Denies urinary sx.

## 2022-12-12 ENCOUNTER — Other Ambulatory Visit: Payer: Self-pay

## 2022-12-12 ENCOUNTER — Encounter: Payer: Self-pay | Admitting: Emergency Medicine

## 2022-12-12 DIAGNOSIS — E119 Type 2 diabetes mellitus without complications: Secondary | ICD-10-CM | POA: Diagnosis not present

## 2022-12-12 DIAGNOSIS — L0231 Cutaneous abscess of buttock: Secondary | ICD-10-CM | POA: Insufficient documentation

## 2022-12-12 DIAGNOSIS — Z5321 Procedure and treatment not carried out due to patient leaving prior to being seen by health care provider: Secondary | ICD-10-CM | POA: Insufficient documentation

## 2022-12-12 NOTE — ED Triage Notes (Signed)
Pt c/o right buttocks abscess about the size of a grapefruit. Endorses concern for worsening pain and difficult sitting. No chills/fevers.

## 2022-12-13 ENCOUNTER — Emergency Department
Admission: EM | Admit: 2022-12-13 | Discharge: 2022-12-13 | Discharge status: Against Medical Advice | Payer: Managed Care, Other (non HMO) | Attending: Emergency Medicine | Admitting: Emergency Medicine

## 2022-12-13 ENCOUNTER — Emergency Department
Admission: EM | Admit: 2022-12-13 | Discharge: 2022-12-13 | Disposition: A | Discharge status: Home / Self Care | Payer: Managed Care, Other (non HMO) | Source: Home / Self Care | Attending: Emergency Medicine | Admitting: Emergency Medicine

## 2022-12-13 ENCOUNTER — Encounter: Payer: Self-pay | Admitting: Emergency Medicine

## 2022-12-13 DIAGNOSIS — L0291 Cutaneous abscess, unspecified: Secondary | ICD-10-CM

## 2022-12-13 DIAGNOSIS — L0231 Cutaneous abscess of buttock: Secondary | ICD-10-CM | POA: Insufficient documentation

## 2022-12-13 MED ORDER — LIDOCAINE-EPINEPHRINE 2 %-1:100000 IJ SOLN
20.0000 mL | Freq: Once | INTRAMUSCULAR | Status: AC
  Administered 2022-12-13: 20 mL via INTRADERMAL
  Filled 2022-12-13: qty 1

## 2022-12-13 MED ORDER — SULFAMETHOXAZOLE-TRIMETHOPRIM 800-160 MG PO TABS: 1.0000 | ORAL_TABLET | Freq: Two times a day (BID) | ORAL | 0 refills | Status: AC

## 2022-12-13 MED ORDER — CEPHALEXIN 500 MG PO CAPS: 500.0000 mg | ORAL_CAPSULE | Freq: Three times a day (TID) | ORAL | 0 refills | Status: AC

## 2022-12-13 MED ORDER — IBUPROFEN 600 MG PO TABS: 600.0000 mg | ORAL_TABLET | Freq: Four times a day (QID) | ORAL | 0 refills | Status: DC | PRN

## 2022-12-13 NOTE — ED Triage Notes (Signed)
Patient to ED via POV for abscess on right buttocks. States ongoing x3 days. Seen last PM but LBBS.

## 2022-12-13 NOTE — ED Provider Notes (Signed)
[facility] Provider Note    Event Date/Time   First MD Initiated Contact with Patient 12/13/22 [phone]     (approximate)   History   Abscess   HPI  Ronald Montgomery is a 42 y.o. male here with gluteal pain and area of abscess.  The patient states that for the last several days, he separatively worsening aching, throbbing pain along the an area to his right gluteal region.  He has a history of abscess in this area.  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  He states he has been trying to keep the area dry and has noticed progressively worsening swelling and redness.  He checked in yesterday but left due to the long wait.  He returns today due to worsening pain.  No fevers.  No chills.  He is diabetic.     Physical Exam   Triage Vital Signs: ED Triage Vitals  Encounter Vitals Group     BP 12/13/22 0840 (!) 155/103     Systolic BP Percentile --      Diastolic BP Percentile --      Pulse Rate 12/13/22 0839 92     Resp 12/13/22 0839 18     Temp 12/13/22 0839 97.9 F (36.6 C)     Temp Source 12/13/22 0839 Oral     SpO2 12/13/22 0839 100 %     Weight --      Height --      Head Circumference --      Peak Flow --      Pain Score 12/13/22 0840 10     Pain Loc --      Pain Education --      Exclude from Growth Chart --     Most recent vital signs: Vitals:   12/13/22 0839 12/13/22 0840  BP:  (!) 155/103  Pulse: 92   Resp: 18   Temp: 97.9 F (36.6 C)   SpO2: 100%      General: Awake, no distress.  CV:  Good peripheral perfusion.  Regular rate and rhythm. Resp:  Normal work of breathing.  Abd:  No distention.  Other:  Approximately 3 x 2 cm area of induration and mild fluctuance to the right perineal area along the medial, superior aspect of the thigh extending to the buttocks.  No crepitance.  No extension to the scrotum or midline  peritoneum.   ED Results / Procedures / Treatments   Labs (all labs ordered are listed, but only abnormal results are displayed) Labs Reviewed - No data to display   EKG    RADIOLOGY    I also independently reviewed and agree with radiologist interpretations.   PROCEDURES:  Critical Care performed: No  ..Incision and Drainage  Date/Time: 12/13/2022 10:02 AM  Performed by: Shaune Pollack, MD Authorized by: Shaune Pollack, MD   Consent:    Consent obtained:  Verbal   Consent given by:  Patient   Risks, benefits, and alternatives were discussed: yes     Risks discussed:  Bleeding, damage to other organs, incomplete drainage, infection and pain   Alternatives discussed:  Referral Universal protocol:    Procedure explained and questions answered to patient or proxy's satisfaction: no     Relevant documents present and verified: no     Test results available : no  Imaging studies available: no     Required blood products, implants, devices, and special equipment available: no     Site/side marked: no     Immediately prior to procedure, a time out was called: no     Patient identity confirmed:  Verbally with patient Location:    Type:  Abscess   Location:  Lower extremity   Lower extremity location:  Buttock   Buttock location:  R buttock Pre-procedure details:    Skin preparation:  Povidone-iodine Anesthesia:    Anesthesia method:  Local infiltration   Local anesthetic:  Lidocaine 2% WITH epi Procedure type:    Complexity:  Simple Procedure details:    Incision types:  Single straight   Incision depth:  Dermal   Wound management:  Probed and deloculated, irrigated with saline and extensive cleaning   Drainage:  Purulent   Drainage amount:  Copious   Packing materials:  1/4 in iodoform gauze     MEDICATIONS ORDERED IN ED: Medications  lidocaine-EPINEPHrine (XYLOCAINE W/EPI) 2 %-1:100000 (with pres) injection 20 mL (20 mLs Intradermal Given by Other  12/13/22 8469)     IMPRESSION / MDM / ASSESSMENT AND PLAN / ED COURSE  I reviewed the triage vital signs and the nursing notes.                              Differential diagnosis includes, but is not limited to, abscess, cellulitis, folliculitis, dermatitis  Patient's presentation is most consistent with acute presentation with potential threat to life or bodily function.  42 year old male here with gluteal abscess.  Patient has a history of the same.  No fever, chills, or signs of sepsis.  He has no tenderness beyond the areas of erythema or evidence to suggest necrotizing infection or Fournier's.  After informed consent, incision and drainage performed with expression of copious amounts of purulence.  Patient will be started on empiric antibiotics and discharged home.  Good return precautions given.  Iodoform gauze placed, patient will return in 4-8 hours for wound check and removal.     FINAL CLINICAL IMPRESSION(S) / ED DIAGNOSES   Final diagnoses:  Abscess     Rx / DC Orders   ED Discharge Orders          Ordered    sulfamethoxazole-trimethoprim (BACTRIM DS) 800-160 MG tablet  2 times daily        12/13/22 1005    cephALEXin (KEFLEX) 500 MG capsule  3 times daily        12/13/22 1005    ibuprofen (ADVIL) 600 MG tablet  Every 6 hours PRN        12/13/22 1005             Note:  This document was prepared using Dragon voice recognition software and may include unintentional dictation errors.   Shaune Pollack, MD 12/13/22 [phone]

## 2023-01-27 NOTE — Progress Notes (Deleted)
01/31/2023 3:14 PM   Luciano Cutter 08/03/1980 [id number]  Referring provider: Jacky Kindle, FNP [address] [place],  [place] [address]  Urological history: 1. BPH with LU TS -PSA (10/2021) - 1.1  2. Undesired fertility -vasectomy consul 03/2021  3. ED -Contributing factors of age, diabetes, obesity, smoking, HTN and history of alcohol abuse -managed with tadalafil 5 mg daily  No chief complaint on file.  HPI: Ronald Montgomery is a 42 y.o. male who presents today for wanting a surgery.    Previous records reviewed.   I PSS ***  PVR ***  UA ***    Score:  1-7 Mild 8-19 Moderate 20-35 Severe   SHIM ***    Score: 1-7 Severe ED 8-11 Moderate ED 12-16 Mild-Moderate ED 17-21 Mild ED 22-25 No ED   PMH: Past Medical History:  Diagnosis Date   Alcohol abuse    Asthma    Chlamydia    Constipation    Diabetes mellitus without complication (HCC)    Difficult intubation    Penile pain 02/03/2015   Normal exams x several    Scabies    Testicular/scrotal pain 02/03/2015   Normal exam and [place] x several.     Thyroid disorder    during childhood    Surgical History: Past Surgical History:  Procedure Laterality Date   ABCESS DRAINAGE     groin area   CYST EXCISION     top of head   DIRECT LARYNGOSCOPY  07/07/2015   Procedure: DIRECT LARYNGOSCOPY;  Surgeon: Linus Salmons, MD;  Location: [facility] ORS;  Service: ENT;;   INCISION AND DRAINAGE ABSCESS N/A 07/07/2015   Procedure: INCISION AND DRAINAGE ABSCESS;  Surgeon: Linus Salmons, MD;  Location: [facility] ORS;  Service: ENT;  Laterality: N/A;  epiglottic abscess   RHYTIDECTOMY NECK / CHEEK / CHIN     due to accident   TONSILLECTOMY Bilateral 06/13/2017   Procedure: TONSILLECTOMY;  Surgeon: Linus Salmons, MD;  Location: [facility] ORS;  Service: ENT;  Laterality: Bilateral;    Home Medications:  Allergies as of 01/31/2023   No Known Allergies      Medication List        Accurate as of January 27, 2023   3:14 PM. If you have any questions, ask your nurse or doctor.          blood glucose meter kit and supplies Dispense based on patient and insurance preference. Use up to four times daily as directed. (FOR ICD-10 E10.9, E11.9).   celecoxib 100 MG capsule Commonly known as: CeleBREX Take 1 capsule (100 mg total) by mouth 2 (two) times daily.   cyclobenzaprine 10 MG tablet Commonly known as: FLEXERIL Take 1 tablet (10 mg total) by mouth at bedtime.   gabapentin 300 MG capsule Commonly known as: NEURONTIN Take 1 capsule (300 mg total) by mouth 3 (three) times daily.   glipiZIDE 10 MG tablet Commonly known as: GLUCOTROL Take 1 tablet (10 mg total) by mouth 2 (two) times daily before a meal.   ibuprofen 600 MG tablet Commonly known as: ADVIL Take 1 tablet (600 mg total) by mouth every 6 (six) hours as needed for moderate pain.   LORazepam 1 MG tablet Commonly known as: ATIVAN Take 1 tablet (1 mg total) by mouth daily as needed for anxiety.   losartan-hydrochlorothiazide 100-25 MG tablet Commonly known as: HYZAAR Take 1 tablet by mouth daily.   metFORMIN 500 MG tablet Commonly known as: GLUCOPHAGE Take 2 tablets (1,000  mg total) by mouth 2 (two) times daily with a meal.   naltrexone 50 MG tablet Commonly known as: DEPADE Take 1-2 tablets daily PO to assist with alcohol use disorder   nystatin-triamcinolone ointment Commonly known as: MYCOLOG Apply 1 Application topically at bedtime. Apply to outside of rectum nightly to assist   sertraline 100 MG tablet Commonly known as: ZOLOFT Take 1 tablet (100 mg total) by mouth daily.   sitaGLIPtin 100 MG tablet Commonly known as: Januvia Take 1 tablet (100 mg total) by mouth daily.        Allergies: No Known Allergies  Family History: Family History  Problem Relation Age of Onset   Skin cancer Mother    Diabetes Mother    Thyroid cancer Maternal Grandfather    Heart attack Maternal Grandfather    Diabetes Maternal  Grandfather     Social History:  reports that he has been smoking cigarettes. He has never used smokeless tobacco. He reports current alcohol use. He reports that he does not currently use drugs after having used the following drugs: Marijuana.  ROS: Pertinent ROS in HPI  Physical Exam: There were no vitals taken for this visit.  Constitutional:  Well nourished. Alert and oriented, No acute distress. HEENT: [place] AT, moist mucus membranes.  Trachea midline, no masses. Cardiovascular: No clubbing, cyanosis, or edema. Respiratory: Normal respiratory effort, no increased work of breathing. GI: Abdomen is soft, non tender, non distended, no abdominal masses. Liver and spleen not palpable.  No hernias appreciated.  Stool sample for occult testing is not indicated.   GU: No CVA tenderness.  No bladder fullness or masses.  Patient with circumcised/uncircumcised phallus. ***Foreskin easily retracted***  Urethral meatus is patent.  No penile discharge. No penile lesions or rashes. Scrotum without lesions, cysts, rashes and/or edema.  Testicles are located scrotally bilaterally. No masses are appreciated in the testicles. Left and right epididymis are normal. Rectal: Patient with  normal sphincter tone. Anus and perineum without scarring or rashes. No rectal masses are appreciated. Prostate is approximately *** grams, *** nodules are appreciated. Seminal vesicles are normal. Skin: No rashes, bruises or suspicious lesions. Lymph: No cervical or inguinal adenopathy. Neurologic: Grossly intact, no focal deficits, moving all 4 extremities. Psychiatric: Normal mood and affect.  Laboratory Data: Lab Results  Component Value Date   WBC 13.5 (H) 07/20/2022   HGB 17.9 (H) 07/20/2022   HCT 48.6 07/20/2022   MCV 90.5 07/20/2022   PLT 228 07/20/2022    Lab Results  Component Value Date   CREATININE 1.07 07/20/2022    Lab Results  Component Value Date   AST 40 07/20/2022   Lab Results  Component  Value Date   ALT 60 (H) 07/20/2022    Urinalysis ***  I have reviewed the labs.   Pertinent Imaging: N/A  Assessment & Plan:  ***  1. BPH with LU TS ***  No follow-ups on file.  These notes generated with voice recognition software. I apologize for typographical errors.  Cloretta Ned  [facility] [address]  [address] [place], [place] [address] [phone]

## 2023-01-31 ENCOUNTER — Encounter: Payer: Self-pay | Admitting: Urology

## 2023-01-31 ENCOUNTER — Ambulatory Visit: Payer: Managed Care, Other (non HMO) | Admitting: Urology

## 2023-01-31 DIAGNOSIS — N138 Other obstructive and reflux uropathy: Secondary | ICD-10-CM

## 2023-02-01 ENCOUNTER — Encounter: Payer: Managed Care, Other (non HMO) | Admitting: Urology

## 2023-02-02 ENCOUNTER — Ambulatory Visit: Payer: Managed Care, Other (non HMO) | Admitting: Urology

## 2023-02-02 ENCOUNTER — Encounter: Payer: Self-pay | Admitting: Urology

## 2023-03-02 ENCOUNTER — Emergency Department: Payer: Managed Care, Other (non HMO)

## 2023-03-02 ENCOUNTER — Emergency Department
Admission: EM | Admit: 2023-03-02 | Discharge: 2023-03-03 | Disposition: A | Discharge status: Home / Self Care | Payer: Managed Care, Other (non HMO) | Attending: Emergency Medicine | Admitting: Emergency Medicine

## 2023-03-02 DIAGNOSIS — F10920 Alcohol use, unspecified with intoxication, uncomplicated: Secondary | ICD-10-CM | POA: Insufficient documentation

## 2023-03-02 DIAGNOSIS — T07XXXA Unspecified multiple injuries, initial encounter: Secondary | ICD-10-CM

## 2023-03-02 DIAGNOSIS — S0081XA Abrasion of other part of head, initial encounter: Secondary | ICD-10-CM | POA: Diagnosis not present

## 2023-03-02 DIAGNOSIS — W19XXXA Unspecified fall, initial encounter: Secondary | ICD-10-CM | POA: Insufficient documentation

## 2023-03-02 DIAGNOSIS — R456 Violent behavior: Secondary | ICD-10-CM | POA: Diagnosis not present

## 2023-03-02 DIAGNOSIS — S80211A Abrasion, right knee, initial encounter: Secondary | ICD-10-CM | POA: Insufficient documentation

## 2023-03-02 DIAGNOSIS — S0990XA Unspecified injury of head, initial encounter: Secondary | ICD-10-CM | POA: Diagnosis present

## 2023-03-02 DIAGNOSIS — Z008 Encounter for other general examination: Secondary | ICD-10-CM

## 2023-03-02 DIAGNOSIS — Y908 Blood alcohol level of 240 mg/100 ml or more: Secondary | ICD-10-CM | POA: Insufficient documentation

## 2023-03-02 LAB — CBC
HCT: 44.7 % (ref 39.0–52.0)
Hemoglobin: 15.8 g/dL (ref 13.0–17.0)
MCH: 31.7 pg (ref 26.0–34.0)
MCHC: 35.3 g/dL (ref 30.0–36.0)
MCV: 89.6 fL (ref 80.0–100.0)
Platelets: 184 10*3/uL (ref 150–400)
RBC: 4.99 MIL/uL (ref 4.22–5.81)
RDW: 12.6 % (ref 11.5–15.5)
WBC: 13 10*3/uL — ABNORMAL HIGH (ref 4.0–10.5)
nRBC: 0 % (ref 0.0–0.2)

## 2023-03-02 MED ORDER — DIPHENHYDRAMINE HCL 50 MG/ML IJ SOLN
50.0000 mg | Freq: Once | INTRAMUSCULAR | Status: AC
  Administered 2023-03-02: 50 mg via INTRAMUSCULAR

## 2023-03-02 MED ORDER — SODIUM CHLORIDE 0.9 % IV BOLUS
1000.0000 mL | Freq: Once | INTRAVENOUS | Status: AC
  Administered 2023-03-02: 1000 mL via INTRAVENOUS

## 2023-03-02 MED ORDER — LORAZEPAM 2 MG/ML IJ SOLN
2.0000 mg | Freq: Once | INTRAMUSCULAR | Status: AC
  Administered 2023-03-02: 2 mg via INTRAMUSCULAR

## 2023-03-02 MED ORDER — HALOPERIDOL LACTATE 5 MG/ML IJ SOLN
5.0000 mg | Freq: Once | INTRAMUSCULAR | Status: AC
  Administered 2023-03-02: 5 mg via INTRAMUSCULAR

## 2023-03-02 NOTE — ED Provider Notes (Signed)
[facility] Provider Note    Event Date/Time   First MD Initiated Contact with Patient 03/02/23 2307     (approximate)   History   Medical clearance for jail   HPI  Level V caveat: Limited by combative nature  Ronald Montgomery is a 42 y.o. male brought to the ED by multiple police officers for medical clearance for jail and warrant for forensic blood draw.  Patient extremely intoxicated, cursing, spitting, combative, requiring shackles and spit mask.  Refuses to answer questions.  Abrasions noted to left cheek and right knee.  Rest of history is unobtainable secondary to patient's combative and aggressive nature.     Past Medical History  No past medical history on file.   Active Problem List  There are no problems to display for this patient.    Past Surgical History  See chart   Home Medications   Prior to Admission medications   Not on File     Allergies  Patient has no allergy information on record.   Family History  No family history on file.   Physical Exam  Triage Vital Signs: ED Triage Vitals  Encounter Vitals Group     BP --      Systolic BP Percentile --      Diastolic BP Percentile --      Pulse Rate 03/02/23 2258 (!) 140     Resp 03/02/23 2258 20     Temp 03/02/23 2258 98.2 F (36.8 C)     Temp Source 03/02/23 2258 Oral     SpO2 03/02/23 2258 98 %     Weight 03/02/23 2259 240 lb (108.9 kg)     Height 03/02/23 2259 5\' 6"  (1.676 m)     Head Circumference --      Peak Flow --      Pain Score --      Pain Loc --      Pain Education --      Exclude from Growth Chart --     Updated Vital Signs: BP 106/66   Pulse 87   Temp 98.2 F (36.8 C) (Oral)   Resp 17   Ht 5\' 6"  (1.676 m)   Wt 108.9 kg   SpO2 97%   BMI 38.74 kg/m    General: Awake, moderate distress.  CV:  Tachycardic.  Good peripheral perfusion.  Resp:  Normal effort.  CTAB. Abd:  No distention.  Other:  Cursing, yelling, combative,  threatening, aggressive, spitting.  Abrasion noted to left cheek and right knee.   ED Results / Procedures / Treatments  Labs (all labs ordered are listed, but only abnormal results are displayed) Labs Reviewed  CBC - Abnormal; Notable for the following components:      Result Value   WBC 13.0 (*)    All other components within normal limits  COMPREHENSIVE METABOLIC PANEL - Abnormal; Notable for the following components:   Sodium 131 (*)    CO2 16 (*)    Glucose, Bld 356 (*)    Calcium 8.0 (*)    AST 42 (*)    ALT 67 (*)    All other components within normal limits  ETHANOL - Abnormal; Notable for the following components:   Alcohol, Ethyl (B) 272 (*)    All other components within normal limits     EKG  None   RADIOLOGY I have independently visualized and interpreted patient's imaging studies as well as noted the radiology interpretation:  CT head: No ICH  Chest x-ray: No acute cardiopulmonary process  Official radiology report(s): CT Head Wo Contrast  Result Date: 03/03/2023 CLINICAL DATA:  Mental status change, unknown cause EXAM: CT HEAD WITHOUT CONTRAST TECHNIQUE: Contiguous axial images were obtained from the base of the skull through the vertex without intravenous contrast. RADIATION DOSE REDUCTION: This exam was performed according to the departmental dose-optimization program which includes automated exposure control, adjustment of the mA and/or kV according to patient size and/or use of iterative reconstruction technique. COMPARISON:  07/20/2022 FINDINGS: Brain: No acute intracranial abnormality. Specifically, no hemorrhage, hydrocephalus, mass lesion, acute infarction, or significant intracranial injury. Vascular: No hyperdense vessel or unexpected calcification. Skull: No acute calvarial abnormality. Sinuses/Orbits: Mucosal thickening in the ethmoid air cells. No air-fluid levels. Other: None IMPRESSION: No acute intracranial abnormality. Electronically Signed    By: Charlett Nose M.D.   On: 03/03/2023 00:08   DG Chest Port 1 View  Result Date: 03/02/2023 CLINICAL DATA:  Vomiting. Medical clearance for jail and legal blood draw. EXAM: PORTABLE CHEST 1 VIEW COMPARISON:  05/04/2021 FINDINGS: Shallow inspiration. Heart size and pulmonary vascularity are normal. Lungs are clear. No pleural effusions. No pneumothorax. Mediastinal contours appear intact. IMPRESSION: No active disease. Electronically Signed   By: Burman Nieves M.D.   On: 03/02/2023 23:51     PROCEDURES:  Critical Care performed: Yes, see critical care procedure note(s)  CRITICAL CARE Performed by: Irean Hong   Total critical care time: 20 minutes  Critical care time was exclusive of separately billable procedures and treating other patients.  Critical care was necessary to treat or prevent imminent or life-threatening deterioration.  Critical care was time spent personally by me on the following activities: development of treatment plan with patient and/or surrogate as well as nursing, discussions with consultants, evaluation of patient's response to treatment, examination of patient, obtaining history from patient or surrogate, ordering and performing treatments and interventions, ordering and review of laboratory studies, ordering and review of radiographic studies, pulse oximetry and re-evaluation of patient's condition.   Marland Kitchen1-3 Lead EKG Interpretation  Performed by: Irean Hong, MD Authorized by: Irean Hong, MD     Interpretation: abnormal     ECG rate:  140   ECG rate assessment: tachycardic     Rhythm: sinus tachycardia     Ectopy: none     Conduction: normal   Comments:     Patient placed on cardiac monitor to evaluate for arrhythmias    MEDICATIONS ORDERED IN ED: Medications  diphenhydrAMINE (BENADRYL) injection 50 mg (50 mg Intramuscular Given 03/02/23 2324)  haloperidol lactate (HALDOL) injection 5 mg (5 mg Intramuscular Given 03/02/23 2324)  LORazepam  (ATIVAN) injection 2 mg (2 mg Intramuscular Given 03/02/23 2324)  sodium chloride 0.9 % bolus 1,000 mL (0 mLs Intravenous Stopped 03/03/23 0146)  sodium chloride 0.9 % bolus 1,000 mL (1,000 mLs Intravenous New Bag/Given 03/03/23 0141)     IMPRESSION / MDM / ASSESSMENT AND PLAN / ED COURSE  I reviewed the triage vital signs and the nursing notes.                             42 year old male brought by Patent examiner for medical clearance for jail. Differential diagnosis includes, but is not limited to, alcohol, illicit or prescription medications, or other toxic ingestion; intracranial pathology such as stroke or intracerebral hemorrhage; fever or infectious causes including sepsis; hypoxemia and/or hypercarbia; uremia; trauma; endocrine  related disorders such as diabetes, hypoglycemia, and thyroid-related diseases; hypertensive encephalopathy; etc. I personally reviewed patient's records and note there are no prior records to review.  Patient's presentation is most consistent with acute presentation with potential threat to life or bodily function.  The patient is on the cardiac monitor to evaluate for evidence of arrhythmia and/or significant heart rate changes.  Patient unable to be verbally redirected.  Abrasions noted to left cheek and right knee.  Officer states patient fell while resisting arrest, then vomited in the police car; unable to lift his head while vomiting.  Concern for head injury, aspiration.  Requires IM calming agents in order to proceed with medical evaluation.   Clinical Course as of 03/03/23 0419  Thu Mar 02, 2023  2341 Patient sleeping after administration of IM agents. [JS]  Fri Mar 03, 2023  0021 Laboratory results remarkable for hyperglycemia without elevation of anion gap, elevated EtOH.  CT head and chest x-ray negative.  Patient sleeping, IV fluids infusing.  Will continue to monitor and care for patient until he is awake and ready for discharge to jail. [JS]   [id number] Patient starting to wake up more.  Awakens to voice, starts to fall back asleep.  Will continue to monitor and care for patient. [JS]  [id number] Patient awake, alert to person and place.  Took out his IV.  Will discharge with officers to jail.  Strict return precautions given.  Patient verbalizes understanding and agrees with plan of care. [JS]    Clinical Course User Index [JS] Irean Hong, MD     FINAL CLINICAL IMPRESSION(S) / ED DIAGNOSES   Final diagnoses:  Alcoholic intoxication without complication (HCC)  Abrasions of multiple sites  Medical clearance for incarceration     Rx / DC Orders   ED Discharge Orders     None        Note:  This document was prepared using Dragon voice recognition software and may include unintentional dictation errors.   Irean Hong, MD 03/03/23 [phone]

## 2023-03-03 ENCOUNTER — Emergency Department: Payer: Managed Care, Other (non HMO)

## 2023-03-03 ENCOUNTER — Other Ambulatory Visit: Payer: Self-pay

## 2023-03-03 LAB — COMPREHENSIVE METABOLIC PANEL
ALT: 67 U/L — ABNORMAL HIGH (ref 0–44)
AST: 42 U/L — ABNORMAL HIGH (ref 15–41)
Albumin: 4 g/dL (ref 3.5–5.0)
Alkaline Phosphatase: 60 U/L (ref 38–126)
Anion gap: 15 (ref 5–15)
BUN: 13 mg/dL (ref 6–20)
CO2: 16 mmol/L — ABNORMAL LOW (ref 22–32)
Calcium: 8 mg/dL — ABNORMAL LOW (ref 8.9–10.3)
Chloride: 100 mmol/L (ref 98–111)
Creatinine, Ser: 0.86 mg/dL (ref 0.61–1.24)
GFR, Estimated: >60 mL/min (ref >60)
Glucose, Bld: 356 mg/dL — ABNORMAL HIGH (ref 70–99)
Potassium: 3.7 mmol/L (ref 3.5–5.1)
Sodium: 131 mmol/L — ABNORMAL LOW (ref 135–145)
Total Bilirubin: 0.4 mg/dL (ref 0.3–1.2)
Total Protein: 7.9 g/dL (ref 6.5–8.1)

## 2023-03-03 LAB — ETHANOL: Alcohol, Ethyl (B): 272 mg/dL — ABNORMAL HIGH (ref <10)

## 2023-03-03 MED ORDER — SODIUM CHLORIDE 0.9 % IV BOLUS
1000.0000 mL | Freq: Once | INTRAVENOUS | Status: AC
  Administered 2023-03-03: 1000 mL via INTRAVENOUS

## 2023-03-03 NOTE — ED Notes (Signed)
Pt discharged with LEO. Pt drowsy but wakes to verbal stimuli and follows commands. Pt breathing unlabored with symmetric chest rise and fall. Pt discharged with LEO in wheelchair.

## 2023-03-03 NOTE — ED Notes (Addendum)
Pt sleeping soundly. Wakes to verbal stimuli.  Full monitor in place. Breathing noted to be unlabored with symmetric chest rise and fall. Bed low and locked. Side rails raised. Pt remains  with LEO at bedside.

## 2023-03-03 NOTE — ED Notes (Signed)
Patient wakes easily to verbal stimuli, answers questions, and follows commands. MD notified. Pt to be discharged.

## 2023-03-03 NOTE — ED Notes (Signed)
Pt sleeping soundly. Full monitor in place. Breathing noted to be unlabored with symmetric chest rise and fall. Bed low and locked. Side rails raised. Pt remains  with LEO at bedside.

## 2023-03-03 NOTE — ED Notes (Addendum)
Pt sleeping soundly. Full monitor in place. Breathing noted to be unlabored with symmetric chest rise and fall. Bed low and locked. Side rails raised. Pt remains  with LEO at bedside.

## 2023-03-03 NOTE — Discharge Instructions (Signed)
Drink alcohol only in moderation. Return to the ER for worsening symptoms, feelings of hurting yourself or others, or other concerns. °

## 2023-03-03 NOTE — ED Notes (Addendum)
Pt sleeping soundly. Full monitor in place. Breathing noted to be unlabored with symmetric chest rise and fall. Bed low and locked. Side rails raised. Pt remains  with LEO at bedside. CMS intact. Radial pulse palpable bilaterally. Cuffs removed while pt in CT. Cuffs remain off

## 2023-08-02 ENCOUNTER — Other Ambulatory Visit: Payer: Self-pay

## 2023-08-02 ENCOUNTER — Emergency Department
Admission: EM | Admit: 2023-08-02 | Discharge: 2023-08-02 | Disposition: A | Discharge status: Home / Self Care | Payer: MEDICAID | Attending: Emergency Medicine | Admitting: Emergency Medicine

## 2023-08-02 DIAGNOSIS — L03313 Cellulitis of chest wall: Secondary | ICD-10-CM | POA: Diagnosis not present

## 2023-08-02 DIAGNOSIS — E119 Type 2 diabetes mellitus without complications: Secondary | ICD-10-CM | POA: Insufficient documentation

## 2023-08-02 DIAGNOSIS — L02412 Cutaneous abscess of left axilla: Secondary | ICD-10-CM | POA: Diagnosis present

## 2023-08-02 LAB — CBC WITH DIFFERENTIAL/PLATELET
Abs Immature Granulocytes: 0.07 K/uL (ref 0.00–0.07)
Basophils Absolute: 0.1 K/uL (ref 0.0–0.1)
Basophils Relative: 1 %
Eosinophils Absolute: 0.2 K/uL (ref 0.0–0.5)
Eosinophils Relative: 1 %
HCT: 44.4 % (ref 39.0–52.0)
Hemoglobin: 15.7 g/dL (ref 13.0–17.0)
Immature Granulocytes: 1 %
Lymphocytes Relative: 22 %
Lymphs Abs: 2.4 K/uL (ref 0.7–4.0)
MCH: 32 pg (ref 26.0–34.0)
MCHC: 35.4 g/dL (ref 30.0–36.0)
MCV: 90.6 fL (ref 80.0–100.0)
Monocytes Absolute: 0.7 K/uL (ref 0.1–1.0)
Monocytes Relative: 7 %
Neutro Abs: 7.4 K/uL (ref 1.7–7.7)
Neutrophils Relative %: 68 %
Platelets: 190 K/uL (ref 150–400)
RBC: 4.9 MIL/uL (ref 4.22–5.81)
RDW: 12.4 % (ref 11.5–15.5)
WBC: 10.8 K/uL — ABNORMAL HIGH (ref 4.0–10.5)
nRBC: 0 % (ref 0.0–0.2)

## 2023-08-02 LAB — COMPREHENSIVE METABOLIC PANEL WITH GFR
ALT: 32 U/L (ref 0–44)
AST: 21 U/L (ref 15–41)
Albumin: 3.8 g/dL (ref 3.5–5.0)
Alkaline Phosphatase: 66 U/L (ref 38–126)
Anion gap: 10 (ref 5–15)
BUN: 16 mg/dL (ref 6–20)
CO2: 23 mmol/L (ref 22–32)
Calcium: 8.9 mg/dL (ref 8.9–10.3)
Chloride: 103 mmol/L (ref 98–111)
Creatinine, Ser: 0.86 mg/dL (ref 0.61–1.24)
GFR, Estimated: >60 mL/min
Glucose, Bld: 353 mg/dL — ABNORMAL HIGH (ref 70–99)
Potassium: 3.4 mmol/L — ABNORMAL LOW (ref 3.5–5.1)
Sodium: 136 mmol/L (ref 135–145)
Total Bilirubin: 0.7 mg/dL (ref 0.0–1.2)
Total Protein: 7.2 g/dL (ref 6.5–8.1)

## 2023-08-02 MED ORDER — DOXYCYCLINE HYCLATE 100 MG PO TABS
100.0000 mg | ORAL_TABLET | Freq: Once | ORAL | Status: AC
  Administered 2023-08-02: 100 mg via ORAL
  Filled 2023-08-02: qty 1

## 2023-08-02 MED ORDER — LIDOCAINE-EPINEPHRINE 2 %-1:100000 IJ SOLN
20.0000 mL | Freq: Once | INTRAMUSCULAR | Status: AC
  Administered 2023-08-02: 20 mL via INTRADERMAL
  Filled 2023-08-02: qty 1

## 2023-08-02 MED ORDER — DOXYCYCLINE HYCLATE 100 MG PO TABS: 100.0000 mg | ORAL_TABLET | Freq: Two times a day (BID) | ORAL | 0 refills | Status: AC

## 2023-08-02 NOTE — ED Notes (Signed)
 MD at bedside.

## 2023-08-02 NOTE — ED Triage Notes (Signed)
 Pt arrives with c/o wound/boil infection on his left chest area that started a few days ago. Pt endorses pain, redness, and swelling to the area. Pt denies fevers. Pt endorses fatigue.

## 2023-08-02 NOTE — ED Provider Notes (Signed)
 [facility] Provider Note   Event Date/Time   First MD Initiated Contact with Patient 08/02/23 2020     (approximate) History  Wound Infection  HPI Ronald Montgomery is a 43 y.o. male with a past medical history of polysubstance abuse, type 2 diabetes, thyroid disorder who presents complaining of of multiple areas of erythema and induration overlying the left pectoral region, left axilla, and left posterior hip.  Patient states that they have become more red department and painful over the last week.  Patient states that he attempted to drain the 1 on his chest with a sewing needle approximately 2 days prior to arrival and has had drainage from that area since. ROS: Patient currently denies any fevers, vision changes, tinnitus, difficulty speaking, facial droop, sore throat, shortness of breath, abdominal pain, nausea/vomiting/diarrhea, dysuria, or weakness/numbness/paresthesias in any extremity   Physical Exam  Triage Vital Signs: ED Triage Vitals  Encounter Vitals Group     BP 08/02/23 1955 133/88     Systolic BP Percentile --      Diastolic BP Percentile --      Pulse Rate 08/02/23 1955 98     Resp 08/02/23 1955 15     Temp 08/02/23 1955 97.8 F (36.6 C)     Temp Source 08/02/23 1955 Oral     SpO2 08/02/23 1955 97 %     Weight 08/02/23 1956 240 lb (108.9 kg)     Height --      Head Circumference --      Peak Flow --      Pain Score 08/02/23 1956 6     Pain Loc --      Pain Education --      Exclude from Growth Chart --    Most recent vital signs: Vitals:   08/02/23 1955  BP: 133/88  Pulse: 98  Resp: 15  Temp: 97.8 F (36.6 C)  SpO2: 97%   General: Awake, oriented x4. CV:  Good peripheral perfusion.  Resp:  Normal effort.  Abd:  No distention.  Other:  Middle-aged obese Caucasian male resting comfortably in no acute distress.  There is an area of of 4 cm x 6 cm just medial to the left nipple with central area of eschar and actively draining  purulent fluid.  There is also an area of 4 cm x 4 cm induration and fluctuance in the posterior axillary line on the left at the level of the eighth rib.  There is also a 2 cm x 4 cm area of induration without fluctuance at the left posterior hip lateral to the anterior superior iliac crest ED Results / Procedures / Treatments  Labs (all labs ordered are listed, but only abnormal results are displayed) Labs Reviewed  COMPREHENSIVE METABOLIC PANEL - Abnormal; Notable for the following components:      Result Value   Potassium 3.4 (*)    Glucose, Bld 353 (*)    All other components within normal limits  CBC WITH DIFFERENTIAL/PLATELET - Abnormal; Notable for the following components:   WBC 10.8 (*)    All other components within normal limits   RADIOLOGY Bedside ultrasound shows fluid collections in the left axillary lesion that would be amenable to drainage PROCEDURES: Critical Care performed: No .Incision and Drainage  Date/Time: 08/02/2023 9:59 PM  Performed by: Merwyn Katos, MD Authorized by: Merwyn Katos, MD   Consent:    Consent obtained:  Verbal   Consent given by:  Patient   Risks discussed:  Bleeding, incomplete drainage, pain and infection   Alternatives discussed:  No treatment, delayed treatment, alternative treatment, observation and referral Universal protocol:    Immediately prior to procedure, a time out was called: yes     Patient identity confirmed:  Verbally with patient Location:    Type:  Abscess   Size:  4x4cm   Location:  Trunk   Trunk location:  Chest Pre-procedure details:    Skin preparation:  Povidone-iodine Sedation:    Sedation type:  None Anesthesia:    Anesthesia method:  Local infiltration   Local anesthetic:  Lidocaine 2% WITH epi Procedure type:    Complexity:  Simple Procedure details:    Incision types:  Single straight   Incision depth:  Dermal   Wound management:  Irrigated with saline   Drainage:  Purulent and bloody    Drainage amount:  Moderate   Wound treatment:  Wound left open   Packing materials:  None Post-procedure details:    Procedure completion:  Tolerated well, no immediate complications  MEDICATIONS ORDERED IN ED: Medications  doxycycline (VIBRA-TABS) tablet 100 mg (has no administration in time range)  lidocaine-EPINEPHrine (XYLOCAINE W/EPI) 2 %-1:100000 (with pres) injection 20 mL (20 mLs Intradermal Given 08/02/23 2109)   IMPRESSION / MDM / ASSESSMENT AND PLAN / ED COURSE  I reviewed the triage vital signs and the nursing notes.                             The patient is on the cardiac monitor to evaluate for evidence of arrhythmia and/or significant heart rate changes. Patient's presentation is most consistent with acute presentation with potential threat to life or bodily function. Presentation most consistent with simple Abscess. Given History, Exam, and Workup I have low suspicion for Cellulitis, Necrotizing Fasciitis, Pyomyositis, Sporotrichosis, Osteomyelitis or other emergent problem as a cause for this presentation. Interventions: Patient's abscess has been incised with acceptable resolution  Rx: Doxycycline 100 mg BID x 5 days Disposition: Patient is stable for discharge, advised to follow up with primary care physician in 48 hours.   FINAL CLINICAL IMPRESSION(S) / ED DIAGNOSES   Final diagnoses:  Abscess of axilla, left  Cellulitis of chest wall   Rx / DC Orders   ED Discharge Orders          Ordered    doxycycline (VIBRA-TABS) 100 MG tablet  2 times daily        08/02/23 2152           Note:  This document was prepared using Dragon voice recognition software and may include unintentional dictation errors.   Merwyn Katos, MD 08/02/23 2200

## 2023-08-07 ENCOUNTER — Ambulatory Visit: Payer: Managed Care, Other (non HMO) | Admitting: Family Medicine

## 2023-08-07 NOTE — Progress Notes (Deleted)
      Established patient visit   Patient: Ronald Montgomery   DOB: 05-27-1981   43 y.o. Male  MRN: [id number] Visit Date: 08/07/2023  PCP: Jacky Kindle, FNP (was Robynn Pane)  Today's healthcare provider: Shirlee Latch, MD   No chief complaint on file.  Subjective    HPI   Discussed the use of AI scribe software for clinical note transcription with the patient, who gave verbal consent to proceed.  History of Present Illness             Medications: Outpatient Medications Prior to Visit  Medication Sig   blood glucose meter kit and supplies Dispense based on patient and insurance preference. Use up to four times daily as directed. (FOR ICD-10 E10.9, E11.9).   celecoxib (CELEBREX) 100 MG capsule Take 1 capsule (100 mg total) by mouth 2 (two) times daily.   cyclobenzaprine (FLEXERIL) 10 MG tablet Take 1 tablet (10 mg total) by mouth at bedtime.   doxycycline (VIBRA-TABS) 100 MG tablet Take 1 tablet (100 mg total) by mouth 2 (two) times daily for 10 days.   gabapentin (NEURONTIN) 300 MG capsule Take 1 capsule (300 mg total) by mouth 3 (three) times daily.   glipiZIDE (GLUCOTROL) 10 MG tablet Take 1 tablet (10 mg total) by mouth 2 (two) times daily before a meal.   ibuprofen (ADVIL) 600 MG tablet Take 1 tablet (600 mg total) by mouth every 6 (six) hours as needed for moderate pain.   LORazepam (ATIVAN) 1 MG tablet Take 1 tablet (1 mg total) by mouth daily as needed for anxiety.   losartan-hydrochlorothiazide (HYZAAR) 100-25 MG tablet Take 1 tablet by mouth daily.   metFORMIN (GLUCOPHAGE) 500 MG tablet Take 2 tablets (1,000 mg total) by mouth 2 (two) times daily with a meal.   naltrexone (DEPADE) 50 MG tablet Take 1-2 tablets daily PO to assist with alcohol use disorder   nystatin-triamcinolone ointment (MYCOLOG) Apply 1 Application topically at bedtime. Apply to outside of rectum nightly to assist   sertraline (ZOLOFT) 100 MG tablet Take 1 tablet (100 mg total) by mouth daily.    sitaGLIPtin (JANUVIA) 100 MG tablet Take 1 tablet (100 mg total) by mouth daily.   No facility-administered medications prior to visit.    Review of Systems {Insert previous labs (optional):23779} {See past labs  Heme  Chem  Endocrine  Serology  Results Review (optional):1}   Objective    There were no vitals taken for this visit. {Insert last BP/Wt (optional):23777}{See vitals history (optional):1}  Physical Exam   No results found for any visits on 08/07/23.  Assessment & Plan     Problem List Items Addressed This Visit   None   Assessment and Plan               No follow-ups on file.       Shirlee Latch, MD  [facility] [phone] (phone) [phone] (fax)  [facility]

## 2023-10-16 ENCOUNTER — Emergency Department
Admission: EM | Admit: 2023-10-16 | Discharge: 2023-10-16 | Disposition: A | Discharge status: Home / Self Care | Payer: MEDICAID | Attending: Emergency Medicine | Admitting: Emergency Medicine

## 2023-10-16 ENCOUNTER — Emergency Department: Payer: MEDICAID

## 2023-10-16 ENCOUNTER — Other Ambulatory Visit: Payer: Self-pay

## 2023-10-16 ENCOUNTER — Encounter: Payer: Self-pay | Admitting: Intensive Care

## 2023-10-16 DIAGNOSIS — N5082 Scrotal pain: Secondary | ICD-10-CM | POA: Diagnosis present

## 2023-10-16 LAB — URINALYSIS, ROUTINE W REFLEX MICROSCOPIC
Bilirubin Urine: NEGATIVE
Glucose, UA: 50 mg/dL — AB
Hgb urine dipstick: NEGATIVE
Ketones, ur: NEGATIVE mg/dL
Leukocytes,Ua: NEGATIVE
Nitrite: NEGATIVE
Protein, ur: NEGATIVE mg/dL
Specific Gravity, Urine: 1.02 (ref 1.005–1.030)
pH: 6 (ref 5.0–8.0)

## 2023-10-16 MED ORDER — KETOROLAC TROMETHAMINE 30 MG/ML IJ SOLN: 30.0000 mg | Freq: Once | INTRAMUSCULAR | Status: DC

## 2023-10-16 MED ORDER — NAPROXEN 500 MG PO TABS: 500.0000 mg | ORAL_TABLET | Freq: Two times a day (BID) | ORAL | 2 refills | Status: AC

## 2023-10-16 NOTE — Discharge Instructions (Signed)
 Please take medication as prescribed and consider following up with urology if symptoms are not improved by the end of the week

## 2023-10-16 NOTE — ED Notes (Signed)
 See triage notes. Patient c/o groin pain and upper back pain since Thursday. Patient stated he does a lot of heavy lifting for work.

## 2023-10-16 NOTE — ED Triage Notes (Signed)
 Patient c/o mid back pain and right sided groin pain. Reports it started Thursday and he lifts heavy a lot.  Stated "when I tried to ejaculate last night it felt off and weird. It feels like my muscle there are torn or bruised"

## 2023-10-16 NOTE — ED Provider Notes (Signed)
 [facility] Provider Note    Event Date/Time   First MD Initiated Contact with Patient 10/16/23 [phone]     (approximate)   History   Groin pain   HPI  Ronald Montgomery is a 43 y.o. male who presents with complaints of a soreness in his right scrotum.  He reports he noticed this over the last couple of days and he states it feels almost bruised.  No injury to the area.  No fevers chills, no flank pain.  No dysuria reported.  No concern for STDs.     Physical Exam   Triage Vital Signs: ED Triage Vitals [10/16/23 0728]  Encounter Vitals Group     BP (!) 142/99     Systolic BP Percentile      Diastolic BP Percentile      Pulse Rate 77     Resp 16     Temp 98.5 F (36.9 C)     Temp Source Oral     SpO2 100 %     Weight 108.9 kg (240 lb)     Height 1.676 m (5\' 6" )     Head Circumference      Peak Flow      Pain Score 8     Pain Loc      Pain Education      Exclude from Growth Chart     Most recent vital signs: Vitals:   10/16/23 0728  BP: (!) 142/99  Pulse: 77  Resp: 16  Temp: 98.5 F (36.9 C)  SpO2: 100%     General: Awake, no distress.  CV:  Good peripheral perfusion.  Resp:  Normal effort.  Abd:  No distention.  Other:  Scrotum appears normal, mild soreness to the right lateral testicle, suspicious for epididymitis, no penile discharge   ED Results / Procedures / Treatments   Labs (all labs ordered are listed, but only abnormal results are displayed) Labs Reviewed  URINALYSIS, ROUTINE W REFLEX MICROSCOPIC - Abnormal; Notable for the following components:      Result Value   Color, Urine YELLOW (*)    APPearance CLEAR (*)    Glucose, UA 50 (*)    All other components within normal limits     EKG     RADIOLOGY  Ultrasound scrotum pending   PROCEDURES:  Critical Care performed:   Procedures   MEDICATIONS ORDERED IN ED: Medications - No data to display   IMPRESSION / MDM / ASSESSMENT AND PLAN / ED  COURSE  I reviewed the triage vital signs and the nursing notes. Patient's presentation is most consistent with acute complicated illness / injury requiring diagnostic workup.  Patient presents with right-sided scrotal pain as detailed above, differential includes epididymitis, hernia, UTI, presentation not consistent with kidney stone  Will obtain ultrasound, urinalysis and reevaluate  ----------------------------------------- 10:36 AM on 10/16/2023 ----------------------------------------- Ultrasound and urinalysis are reassuring, patient is appropriate for discharge with outpatient follow-up with urology, return precautions discussed, he agrees with this plan.      FINAL CLINICAL IMPRESSION(S) / ED DIAGNOSES   Final diagnoses:  Scrotal pain     Rx / DC Orders   ED Discharge Orders          Ordered    naproxen  (NAPROSYN ) 500 MG tablet  2 times daily with meals        10/16/23 1033             Note:  This document was prepared using  Dragon [employer] engineer and may include unintentional dictation errors.   Bryson Carbine, MD 10/16/23 1036

## 2023-11-02 ENCOUNTER — Other Ambulatory Visit: Payer: Self-pay

## 2023-11-02 DIAGNOSIS — Z7984 Long term (current) use of oral hypoglycemic drugs: Secondary | ICD-10-CM | POA: Diagnosis not present

## 2023-11-02 DIAGNOSIS — E119 Type 2 diabetes mellitus without complications: Secondary | ICD-10-CM | POA: Diagnosis not present

## 2023-11-02 DIAGNOSIS — M546 Pain in thoracic spine: Secondary | ICD-10-CM | POA: Insufficient documentation

## 2023-11-02 DIAGNOSIS — J45909 Unspecified asthma, uncomplicated: Secondary | ICD-10-CM | POA: Insufficient documentation

## 2023-11-02 NOTE — ED Triage Notes (Signed)
 Pt reports back pain x few days, pt denies injury to back but states he lifts heavy things often which makes pain worse. Pain is also worse with movement.

## 2023-11-03 ENCOUNTER — Emergency Department
Admission: EM | Admit: 2023-11-03 | Discharge: 2023-11-03 | Disposition: A | Discharge status: Home / Self Care | Payer: MEDICAID | Attending: Emergency Medicine | Admitting: Emergency Medicine

## 2023-11-03 DIAGNOSIS — M546 Pain in thoracic spine: Secondary | ICD-10-CM

## 2023-11-03 MED ORDER — OXYCODONE-ACETAMINOPHEN 5-325 MG PO TABS: 2.0000 | ORAL_TABLET | Freq: Three times a day (TID) | ORAL | 0 refills | Status: DC | PRN

## 2023-11-03 MED ORDER — ONDANSETRON 4 MG PO TBDP: 4.0000 mg | ORAL_TABLET | Freq: Four times a day (QID) | ORAL | 0 refills | Status: DC | PRN

## 2023-11-03 MED ORDER — IBUPROFEN 800 MG PO TABS
800.0000 mg | ORAL_TABLET | Freq: Once | ORAL | Status: AC
  Administered 2023-11-03: 800 mg via ORAL
  Filled 2023-11-03: qty 1

## 2023-11-03 MED ORDER — OXYCODONE-ACETAMINOPHEN 5-325 MG PO TABS
2.0000 | ORAL_TABLET | Freq: Once | ORAL | Status: AC
  Administered 2023-11-03: 2 via ORAL
  Filled 2023-11-03: qty 2

## 2023-11-03 MED ORDER — IBUPROFEN 800 MG PO TABS: 800.0000 mg | ORAL_TABLET | Freq: Three times a day (TID) | ORAL | 0 refills | Status: DC | PRN

## 2023-11-03 NOTE — ED Provider Notes (Signed)
 [facility] Provider Note    Event Date/Time   First MD Initiated Contact with Patient 11/03/23 [phone]     (approximate)   History   Back Pain   HPI  Ronald Montgomery is a 43 y.o. male with history of diabetes, alcohol use who presents to the emergency department complaints of upper back pain that radiates across his shoulders and down into his lower back ongoing for the past week.  He states he thinks he may have injured it lifting heavy bags at work.  He denies numbness, tingling, bowel or bladder incontinence, fever.  No prior back surgery.  States pain is worse with trying to sit upright.  He is able to ambulate.  Has tried over-the-counter medications without relief.   History provided by patient.    Past Medical History:  Diagnosis Date   Alcohol abuse    Asthma    Chlamydia    Constipation    Diabetes mellitus without complication (HCC)    Difficult intubation    Penile pain 02/03/2015   Normal exams x several    Scabies    Testicular/scrotal pain 02/03/2015   Normal exam and US  x several.     Thyroid disorder    during childhood    Past Surgical History:  Procedure Laterality Date   ABCESS DRAINAGE     groin area   CYST EXCISION     top of head   DIRECT LARYNGOSCOPY  07/07/2015   Procedure: DIRECT LARYNGOSCOPY;  Surgeon: Lesly Raspberry, MD;  Location: [facility] ORS;  Service: ENT;;   INCISION AND DRAINAGE ABSCESS N/A 07/07/2015   Procedure: INCISION AND DRAINAGE ABSCESS;  Surgeon: Lesly Raspberry, MD;  Location: [facility] ORS;  Service: ENT;  Laterality: N/A;  epiglottic abscess   RHYTIDECTOMY NECK / CHEEK / CHIN     due to accident   TONSILLECTOMY Bilateral 06/13/2017   Procedure: TONSILLECTOMY;  Surgeon: Lesly Raspberry, MD;  Location: [facility] ORS;  Service: ENT;  Laterality: Bilateral;    MEDICATIONS:  Prior to Admission medications   Medication Sig Start Date End Date Taking? Authorizing Provider  blood glucose meter kit and supplies  Dispense based on patient and insurance preference. Use up to four times daily as directed. (FOR ICD-10 E10.9, E11.9). 06/03/20   Flinchum, Charlton Cooler, FNP  celecoxib  (CELEBREX ) 100 MG capsule Take 1 capsule (100 mg total) by mouth 2 (two) times daily. 02/25/22   Normie Becton, FNP  cyclobenzaprine  (FLEXERIL ) 10 MG tablet Take 1 tablet (10 mg total) by mouth at bedtime. 02/25/22   Normie Becton, FNP  gabapentin  (NEURONTIN ) 300 MG capsule Take 1 capsule (300 mg total) by mouth 3 (three) times daily. 02/25/22   Normie Becton, FNP  glipiZIDE  (GLUCOTROL ) 10 MG tablet Take 1 tablet (10 mg total) by mouth 2 (two) times daily before a meal. 11/19/21   Normie Becton, FNP  ibuprofen  (ADVIL ) 600 MG tablet Take 1 tablet (600 mg total) by mouth every 6 (six) hours as needed for moderate pain. 12/13/22   Loman Risk, MD  LORazepam  (ATIVAN ) 1 MG tablet Take 1 tablet (1 mg total) by mouth daily as needed for anxiety. 08/21/21   Webb, Padonda B, FNP  losartan -hydrochlorothiazide (HYZAAR) 100-25 MG tablet Take 1 tablet by mouth daily. 03/23/22   Normie Becton, FNP  metFORMIN  (GLUCOPHAGE ) 500 MG tablet Take 2 tablets (1,000 mg total) by mouth 2 (two) times daily with a meal. 11/19/21   Normie Becton, FNP  naltrexone  (DEPADE) 50 MG tablet Take 1-2 tablets daily PO to assist with alcohol use disorder 03/23/22   Normie Becton, FNP  naproxen  (NAPROSYN ) 500 MG tablet Take 1 tablet (500 mg total) by mouth 2 (two) times daily with a meal. 10/16/23   Bryson Carbine, MD  nystatin -triamcinolone  ointment (MYCOLOG) Apply 1 Application topically at bedtime. Apply to outside of rectum nightly to assist 02/25/22   Normie Becton, FNP  sertraline  (ZOLOFT ) 100 MG tablet Take 1 tablet (100 mg total) by mouth daily. 03/23/22   Normie Becton, FNP  sitaGLIPtin  (JANUVIA ) 100 MG tablet Take 1 tablet (100 mg total) by mouth daily. 11/19/21   Normie Becton, FNP    Physical Exam   Triage Vital Signs: ED Triage Vitals  Encounter Vitals  Group     BP 11/02/23 2225 (!) 146/102     Systolic BP Percentile --      Diastolic BP Percentile --      Pulse Rate 11/02/23 2225 88     Resp 11/02/23 2225 17     Temp 11/02/23 2225 98.4 F (36.9 C)     Temp src --      SpO2 11/02/23 2225 99 %     Weight 11/02/23 2224 240 lb (108.9 kg)     Height 11/02/23 2224 5\' 6"  (1.676 m)     Head Circumference --      Peak Flow --      Pain Score 11/02/23 2224 10     Pain Loc --      Pain Education --      Exclude from Growth Chart --     Most recent vital signs: Vitals:   11/02/23 2225  BP: (!) 146/102  Pulse: 88  Resp: 17  Temp: 98.4 F (36.9 C)  SpO2: 99%    CONSTITUTIONAL: Alert, responds appropriately to questions. Well-appearing; well-nourished HEAD: Normocephalic, atraumatic EYES: Conjunctivae clear, pupils appear equal, sclera nonicteric ENT: normal nose; moist mucous membranes NECK: Supple, normal ROM CARD: RRR; S1 and S2 appreciated RESP: Normal chest excursion without splinting or tachypnea; breath sounds clear and equal bilaterally; no wheezes, no rhonchi, no rales, no hypoxia or respiratory distress, speaking full sentences ABD/GI: Non-distended; soft, non-tender, no rebound, no guarding, no peritoneal signs BACK: The back appears normal, no midline spinal tenderness or step-off or deformity.  Tender to palpation over the thoracic paraspinal muscles bilaterally without increased redness, warmth, ecchymosis, rash or other lesions. EXT: Normal ROM in all joints; no deformity noted, no edema SKIN: Normal color for age and race; warm; no rash on exposed skin NEURO: Moves all extremities equally, normal speech, normal gait, normal sensation, no saddle anesthesia, 2+ deep tendon reflexes in bilateral upper and lower extremities PSYCH: The patient's mood and manner are appropriate.   ED Results / Procedures / Treatments   LABS: (all labs ordered are listed, but only abnormal results are displayed) Labs Reviewed - No data  to display   EKG:     RADIOLOGY: My personal review and interpretation of imaging:    I have personally reviewed all radiology reports.   No results found.   PROCEDURES:  Critical Care performed: No    Procedures    IMPRESSION / MDM / ASSESSMENT AND PLAN / ED COURSE  I reviewed the triage vital signs and the nursing notes.    Patient here with complaints of back pain.  No neuro deficits.  No red flag symptoms.     DIFFERENTIAL DIAGNOSIS (includes  but not limited to):   Muscle strain, muscle spasm, doubt cauda equina, epidural abscess or hematoma, discitis or osteomyelitis, transverse myelitis, fracture   Patient's presentation is most consistent with acute complicated illness / injury requiring diagnostic workup.   PLAN: Will provide with pain medication, anti-inflammatories.  No indication for emergent imaging.  Recommended stretching, alternating heat and ice.  States that he does not think his primary care provider is in practice anymore.  Will place a new PCP referral.  Will discharge with pain medication.  Will provide with work note for several days.   MEDICATIONS GIVEN IN ED: Medications  oxyCODONE -acetaminophen  (PERCOCET/ROXICET) 5-325 MG per tablet 2 tablet (2 tablets Oral Given 11/03/23 0437)  ibuprofen  (ADVIL ) tablet 800 mg (800 mg Oral Given 11/03/23 0437)     ED COURSE:  At this time, I do not feel there is any life-threatening condition present. I reviewed all nursing notes, vitals, pertinent previous records.  All lab and urine results, EKGs, imaging ordered have been independently reviewed and interpreted by myself.  I reviewed all available radiology reports from any imaging ordered this visit.  Based on my assessment, I feel the patient is safe to be discharged home without further emergent workup and can continue workup as an outpatient as needed. Discussed all findings, treatment plan as well as usual and customary return precautions.  They verbalize  understanding and are comfortable with this plan.  Outpatient follow-up has been provided as needed.  All questions have been answered.    CONSULTS:  none   OUTSIDE RECORDS REVIEWED: Reviewed recent urgent care notes.       FINAL CLINICAL IMPRESSION(S) / ED DIAGNOSES   Final diagnoses:  Acute bilateral thoracic back pain     Rx / DC Orders   ED Discharge Orders          Ordered    Ambulatory Referral to Primary Care (Establish Care)        11/03/23 0426    oxyCODONE -acetaminophen  (PERCOCET) 5-325 MG tablet  Every 8 hours PRN        11/03/23 0427    ibuprofen  (ADVIL ) 800 MG tablet  Every 8 hours PRN        11/03/23 0427    ondansetron  (ZOFRAN -ODT) 4 MG disintegrating tablet  Every 6 hours PRN        11/03/23 0427             Note:  This document was prepared using Dragon voice recognition software and may include unintentional dictation errors.   Doneta Bayman, Clover Dao, DO 11/03/23 [phone]

## 2023-11-03 NOTE — Discharge Instructions (Addendum)

## 2023-11-13 ENCOUNTER — Ambulatory Visit (HOSPITAL_BASED_OUTPATIENT_CLINIC_OR_DEPARTMENT_OTHER): Payer: MEDICAID | Admitting: Orthopaedic Surgery

## 2023-11-16 ENCOUNTER — Ambulatory Visit (HOSPITAL_BASED_OUTPATIENT_CLINIC_OR_DEPARTMENT_OTHER): Payer: MEDICAID | Admitting: Student

## 2024-03-09 ENCOUNTER — Emergency Department
Admission: EM | Admit: 2024-03-09 | Discharge: 2024-03-09 | Disposition: A | Discharge status: Home / Self Care | Payer: MEDICAID | Attending: Emergency Medicine | Admitting: Emergency Medicine

## 2024-03-09 ENCOUNTER — Emergency Department: Payer: MEDICAID

## 2024-03-09 ENCOUNTER — Encounter: Payer: Self-pay | Admitting: Emergency Medicine

## 2024-03-09 ENCOUNTER — Other Ambulatory Visit: Payer: Self-pay

## 2024-03-09 DIAGNOSIS — R21 Rash and other nonspecific skin eruption: Secondary | ICD-10-CM | POA: Insufficient documentation

## 2024-03-09 DIAGNOSIS — R10A2 Flank pain, left side: Secondary | ICD-10-CM | POA: Insufficient documentation

## 2024-03-09 DIAGNOSIS — R944 Abnormal results of kidney function studies: Secondary | ICD-10-CM | POA: Insufficient documentation

## 2024-03-09 DIAGNOSIS — R197 Diarrhea, unspecified: Secondary | ICD-10-CM | POA: Insufficient documentation

## 2024-03-09 DIAGNOSIS — D72829 Elevated white blood cell count, unspecified: Secondary | ICD-10-CM | POA: Diagnosis not present

## 2024-03-09 DIAGNOSIS — R112 Nausea with vomiting, unspecified: Secondary | ICD-10-CM | POA: Diagnosis not present

## 2024-03-09 DIAGNOSIS — R300 Dysuria: Secondary | ICD-10-CM | POA: Diagnosis not present

## 2024-03-09 DIAGNOSIS — E119 Type 2 diabetes mellitus without complications: Secondary | ICD-10-CM | POA: Diagnosis not present

## 2024-03-09 DIAGNOSIS — E039 Hypothyroidism, unspecified: Secondary | ICD-10-CM | POA: Diagnosis not present

## 2024-03-09 DIAGNOSIS — R10A1 Flank pain, right side: Secondary | ICD-10-CM | POA: Diagnosis not present

## 2024-03-09 DIAGNOSIS — R1032 Left lower quadrant pain: Secondary | ICD-10-CM | POA: Insufficient documentation

## 2024-03-09 DIAGNOSIS — R1030 Lower abdominal pain, unspecified: Secondary | ICD-10-CM

## 2024-03-09 DIAGNOSIS — R1031 Right lower quadrant pain: Secondary | ICD-10-CM | POA: Diagnosis present

## 2024-03-09 LAB — COMPREHENSIVE METABOLIC PANEL WITH GFR
ALT: 26 U/L (ref 0–44)
AST: 26 U/L (ref 15–41)
Albumin: 3.8 g/dL (ref 3.5–5.0)
Alkaline Phosphatase: 49 U/L (ref 38–126)
Anion gap: 14 (ref 5–15)
BUN: 21 mg/dL — ABNORMAL HIGH (ref 6–20)
CO2: 23 mmol/L (ref 22–32)
Calcium: 9 mg/dL (ref 8.9–10.3)
Chloride: 100 mmol/L (ref 98–111)
Creatinine, Ser: 1.35 mg/dL — ABNORMAL HIGH (ref 0.61–1.24)
GFR, Estimated: >60 mL/min (ref >60)
Glucose, Bld: 134 mg/dL — ABNORMAL HIGH (ref 70–99)
Potassium: 3.5 mmol/L (ref 3.5–5.1)
Sodium: 137 mmol/L (ref 135–145)
Total Bilirubin: 0.6 mg/dL (ref 0.0–1.2)
Total Protein: 7.2 g/dL (ref 6.5–8.1)

## 2024-03-09 LAB — CBG MONITORING, ED: Glucose-Capillary: 123 mg/dL — ABNORMAL HIGH (ref 70–99)

## 2024-03-09 LAB — URINALYSIS, W/ REFLEX TO CULTURE (INFECTION SUSPECTED)
Bacteria, UA: NONE SEEN
Bilirubin Urine: NEGATIVE
Glucose, UA: NEGATIVE mg/dL
Hgb urine dipstick: NEGATIVE
Ketones, ur: NEGATIVE mg/dL
Leukocytes,Ua: NEGATIVE
Nitrite: NEGATIVE
Protein, ur: NEGATIVE mg/dL
Specific Gravity, Urine: 1.018 (ref 1.005–1.030)
Squamous Epithelial / HPF: 0 /HPF (ref 0–5)
pH: 5 (ref 5.0–8.0)

## 2024-03-09 LAB — CBC WITH DIFFERENTIAL/PLATELET
Abs Immature Granulocytes: 0.06 K/uL (ref 0.00–0.07)
Basophils Absolute: 0.1 K/uL (ref 0.0–0.1)
Basophils Relative: 0 %
Eosinophils Absolute: 0.2 K/uL (ref 0.0–0.5)
Eosinophils Relative: 1 %
HCT: 43.1 % (ref 39.0–52.0)
Hemoglobin: 14.9 g/dL (ref 13.0–17.0)
Immature Granulocytes: 1 %
Lymphocytes Relative: 21 %
Lymphs Abs: 2.3 K/uL (ref 0.7–4.0)
MCH: 31 pg (ref 26.0–34.0)
MCHC: 34.6 g/dL (ref 30.0–36.0)
MCV: 89.8 fL (ref 80.0–100.0)
Monocytes Absolute: 0.7 K/uL (ref 0.1–1.0)
Monocytes Relative: 7 %
Neutro Abs: 7.9 K/uL — ABNORMAL HIGH (ref 1.7–7.7)
Neutrophils Relative %: 70 %
Platelets: 186 K/uL (ref 150–400)
RBC: 4.8 MIL/uL (ref 4.22–5.81)
RDW: 12.6 % (ref 11.5–15.5)
WBC: 11.2 K/uL — ABNORMAL HIGH (ref 4.0–10.5)
nRBC: 0 % (ref 0.0–0.2)

## 2024-03-09 LAB — RESP PANEL BY RT-PCR (RSV, FLU A&B, COVID)  RVPGX2
Influenza A by PCR: NEGATIVE
Influenza B by PCR: NEGATIVE
Resp Syncytial Virus by PCR: NEGATIVE
SARS Coronavirus 2 by RT PCR: NEGATIVE

## 2024-03-09 LAB — LIPASE, BLOOD: Lipase: 36 U/L (ref 11–51)

## 2024-03-09 MED ORDER — IOHEXOL 300 MG/ML  SOLN
100.0000 mL | Freq: Once | INTRAMUSCULAR | Status: AC | PRN
  Administered 2024-03-09: 100 mL via INTRAVENOUS

## 2024-03-09 MED ORDER — OXYCODONE-ACETAMINOPHEN 5-325 MG PO TABS: 1.0000 | ORAL_TABLET | ORAL | 0 refills | Status: DC | PRN

## 2024-03-09 MED ORDER — ONDANSETRON HCL 4 MG/2ML IJ SOLN
4.0000 mg | Freq: Once | INTRAMUSCULAR | Status: AC
  Administered 2024-03-09: 4 mg via INTRAVENOUS
  Filled 2024-03-09: qty 2

## 2024-03-09 MED ORDER — KETOROLAC TROMETHAMINE 30 MG/ML IJ SOLN
15.0000 mg | Freq: Once | INTRAMUSCULAR | Status: AC
  Administered 2024-03-09: 15 mg via INTRAVENOUS
  Filled 2024-03-09: qty 1

## 2024-03-09 MED ORDER — OXYCODONE-ACETAMINOPHEN 5-325 MG PO TABS
1.0000 | ORAL_TABLET | Freq: Once | ORAL | Status: AC
  Administered 2024-03-09: 1 via ORAL
  Filled 2024-03-09: qty 1

## 2024-03-09 MED ORDER — LACTATED RINGERS IV BOLUS
1000.0000 mL | Freq: Once | INTRAVENOUS | Status: AC
  Administered 2024-03-09: 1000 mL via INTRAVENOUS

## 2024-03-09 MED ORDER — CEFUROXIME AXETIL 250 MG PO TABS: 250.0000 mg | ORAL_TABLET | Freq: Two times a day (BID) | ORAL | 0 refills | Status: AC

## 2024-03-09 NOTE — ED Triage Notes (Signed)
 Pt via POV from home. Pt c/o fatigue, abd pain, bilateral flank pain, nausea for about a week, pt was dx with prostatitis from increased glucose in his urine a couple of days ago at [facility] and prescribed abx. Pt takes Metformin  for DM. Pt does not feel any better after medications. Pt is A&OX4 and NAD, ambulatory to triage.

## 2024-03-09 NOTE — Discharge Instructions (Addendum)
 Please stop taking your levofloxacin and begin taking the prescribed cefuroxime  (Ceftin ).  Please use a probiotic during this time.  You may use over-the-counter pills of probiotics or at least 1 serving of food with active cultures, including things like yogurt, kombucha, or kefir, once a day every day while on these antibiotics.

## 2024-03-09 NOTE — ED Notes (Signed)
 Patient PO challenged and able to eat saltine crackers and freeze pop. Patient stated that they would uber home and not drive due to pain medication administration.

## 2024-03-09 NOTE — ED Provider Notes (Signed)
 [facility] Provider Note   Event Date/Time   First MD Initiated Contact with Patient 03/09/24 [phone]     (approximate) History  Abdominal Pain  HPI Theadore Blunck is a 43 y.o. male with a stated past medical history of chronic alcohol abuse, sciatica, hypothyroidism, and type 2 diabetes who presents complaining of fatigue, bilateral lower quadrant abdominal pain, and bilateral flank pain that has been present for the last week.  Patient states that this started as a rash to his foreskin that was causing dysuria.  Patient states that he was seen by a telemedicine service as well as seen in clinic 2 days prior to arrival and prescribed levofloxacin for an assumed urinary tract infection due to prostatitis.  Patient has been having lower urinary tract symptoms including difficulty initiating stream and a sensation of incomplete voiding.  Patient states that over the last 24 hours his bilateral lower abdominal pain has become worse, the flank pain has begun, and patient has started having diarrhea.  Patient also endorses nausea with 1 episode of emesis. ROS: Patient currently denies any vision changes, tinnitus, difficulty speaking, facial droop, sore throat, chest pain, shortness of breath, or weakness/numbness/paresthesias in any extremity   Physical Exam  Triage Vital Signs: ED Triage Vitals  Encounter Vitals Group     BP 03/09/24 0751 (!) 137/101     Girls Systolic BP Percentile --      Girls Diastolic BP Percentile --      Boys Systolic BP Percentile --      Boys Diastolic BP Percentile --      Pulse Rate 03/09/24 0751 91     Resp 03/09/24 0751 18     Temp 03/09/24 0751 98 F (36.7 C)     Temp Source 03/09/24 0751 Oral     SpO2 03/09/24 0751 98 %     Weight 03/09/24 0749 222 lb (100.7 kg)     Height 03/09/24 0749 5' 6 (1.676 m)     Head Circumference --      Peak Flow --      Pain Score 03/09/24 0747 6     Pain Loc --      Pain Education --       Exclude from Growth Chart --    Most recent vital signs: Vitals:   03/09/24 0751 03/09/24 0900  BP: (!) 137/101 128/89  Pulse: 91 71  Resp: 18 17  Temp: 98 F (36.7 C)   SpO2: 98% 100%   General: Awake, oriented x4. CV:  Good peripheral perfusion. Resp:  Normal effort. Abd:  No distention.  Mild tenderness palpation of bilateral lower abdominal quadrants Other:  Middle-aged obese Caucasian male resting comfortably in no acute distress ED Results / Procedures / Treatments  Labs (all labs ordered are listed, but only abnormal results are displayed) Labs Reviewed  COMPREHENSIVE METABOLIC PANEL WITH GFR - Abnormal; Notable for the following components:      Result Value   Glucose, Bld 134 (*)    BUN 21 (*)    Creatinine, Ser 1.35 (*)    All other components within normal limits  CBC WITH DIFFERENTIAL/PLATELET - Abnormal; Notable for the following components:   WBC 11.2 (*)    Neutro Abs 7.9 (*)    All other components within normal limits  URINALYSIS, W/ REFLEX TO CULTURE (INFECTION SUSPECTED) - Abnormal; Notable for the following components:   Color, Urine YELLOW (*)    APPearance CLEAR (*)  All other components within normal limits  CBG MONITORING, ED - Abnormal; Notable for the following components:   Glucose-Capillary 123 (*)    All other components within normal limits  RESP PANEL BY RT-PCR (RSV, FLU A&B, COVID)  RVPGX2  LIPASE, BLOOD   RADIOLOGY ED MD interpretation: CT of the abdomen pelvis with IV contrast shows a nonobstructing left kidney stone without any other evidence of acute abnormalities. - All radiology independently interpreted and agree with radiology assessment Official radiology report(s): CT ABDOMEN PELVIS W CONTRAST Result Date: 03/09/2024 CLINICAL DATA:  One-week history of bilateral flank and abdominal pain and nausea EXAM: CT ABDOMEN AND PELVIS WITH CONTRAST TECHNIQUE: Multidetector CT imaging of the abdomen and pelvis was performed using the  standard protocol following bolus administration of intravenous contrast. RADIATION DOSE REDUCTION: This exam was performed according to the departmental dose-optimization program which includes automated exposure control, adjustment of the mA and/or kV according to patient size and/or use of iterative reconstruction technique. CONTRAST:  OMNIPAQUE  IOHEXOL  300 MG/ML  SOLN COMPARISON:  CT abdomen and pelvis dated 09/12/2018 FINDINGS: Lower chest: No focal consolidation or pulmonary nodule in the lung bases. No pleural effusion or pneumothorax demonstrated. Partially imaged heart size is normal. Hepatobiliary: No focal hepatic lesions. No intra or extrahepatic biliary ductal dilation. Gallbladder fundal adenomyomatosis. Pancreas: No focal lesions or main ductal dilation. Spleen: Normal in size without focal abnormality. Adrenals/Urinary Tract: No adrenal nodules. No suspicious renal mass or hydronephrosis. Punctate nonobstructing left interpolar stone. No focal bladder wall thickening. Stomach/Bowel: Normal appearance of the stomach. No evidence of bowel wall thickening, distention, or inflammatory changes. Normal appendix. Vascular/Lymphatic: Aortic atherosclerosis. No enlarged abdominal or pelvic lymph nodes. Reproductive: Prostate is unremarkable. Other: No free fluid, fluid collection, or free air. Musculoskeletal: No acute or abnormal lytic or blastic osseous lesions. Multilevel degenerative changes of the partially imaged thoracic and lumbar spine. IMPRESSION: 1. No acute abdominopelvic findings. 2. Punctate nonobstructing left interpolar stone. 3.  Aortic Atherosclerosis (ICD10-I70.0). Electronically Signed   By: Limin  Xu M.D.   On: 03/09/2024 10:13   PROCEDURES: Critical Care performed: No Procedures MEDICATIONS ORDERED IN ED: Medications  lactated ringers  bolus 1,000 mL (0 mLs Intravenous Stopped 03/09/24 1051)  ketorolac  (TORADOL ) 30 MG/ML injection 15 mg (15 mg Intravenous Given 03/09/24  0853)  ondansetron  (ZOFRAN ) injection 4 mg (4 mg Intravenous Given 03/09/24 0852)  iohexol  (OMNIPAQUE ) 300 MG/ML solution 100 mL (100 mLs Intravenous Contrast Given 03/09/24 0943)  oxyCODONE -acetaminophen  (PERCOCET/ROXICET) 5-325 MG per tablet 1 tablet (1 tablet Oral Given 03/09/24 1039)   IMPRESSION / MDM / ASSESSMENT AND PLAN / ED COURSE  I reviewed the triage vital signs and the nursing notes.                             The patient is on the cardiac monitor to evaluate for evidence of arrhythmia and/or significant heart rate changes. Patient's presentation is most consistent with acute presentation with potential threat to life or bodily function. Patient is a 43 year old male with the above-stated past medical history presents complaining of bilateral abdominal pain in the setting of possible prostatitis. DDx: Urosepsis, urinary tract infection, colitis, appendicitis Plan: CBC, CMP, lipase, UA, RVP, CT of the abdomen/pelvis Results: WBC 11.2, UA negative for any signs of infection, creatinine mildly elevated at 1.35, CT negative for any acute abnormalities  Discussed patient's results at length including negative CT scan, mildly elevated creatinine at 1.35 which may  be the source of patient's bilateral flank pain.  Patient is not significantly dehydrated clinically however does admit to decreased p.o. intake and is likely the reason for this mild elevation of patient's creatinine.  I did discuss with patient however that this may be medication related and he requests to be switched to a different antibiotic.  Will write a prescription for Ceftin  and instructed patient to stop his levofloxacin.  Patient also requested a short course of stronger pain medication and therefore give patient 8 tabs of Norco.  Patient agrees with plan for discharge at this time and to follow-up with his primary care physician for further evaluation and management if this pain persists.  Dispo: Discharge home with PCP  follow-up   FINAL CLINICAL IMPRESSION(S) / ED DIAGNOSES   Final diagnoses:  Lower abdominal pain   Rx / DC Orders   ED Discharge Orders          Ordered    cefUROXime  (CEFTIN ) 250 MG tablet  2 times daily with meals        03/09/24 1032    oxyCODONE -acetaminophen  (PERCOCET) 5-325 MG tablet  Every 4 hours PRN        03/09/24 1032           Note:  This document was prepared using Dragon voice recognition software and may include unintentional dictation errors.   Rishawn Walck K, MD 03/09/24 [phone]

## 2024-03-31 ENCOUNTER — Emergency Department: Payer: MEDICAID

## 2024-03-31 ENCOUNTER — Other Ambulatory Visit: Payer: Self-pay

## 2024-03-31 ENCOUNTER — Emergency Department
Admission: EM | Admit: 2024-03-31 | Discharge: 2024-03-31 | Disposition: A | Discharge status: Home / Self Care | Payer: MEDICAID | Attending: Emergency Medicine | Admitting: Emergency Medicine

## 2024-03-31 DIAGNOSIS — N503 Cyst of epididymis: Secondary | ICD-10-CM | POA: Insufficient documentation

## 2024-03-31 DIAGNOSIS — E119 Type 2 diabetes mellitus without complications: Secondary | ICD-10-CM | POA: Insufficient documentation

## 2024-03-31 LAB — URINALYSIS, ROUTINE W REFLEX MICROSCOPIC
Bilirubin Urine: NEGATIVE
Glucose, UA: 150 mg/dL — AB
Hgb urine dipstick: NEGATIVE
Ketones, ur: NEGATIVE mg/dL
Leukocytes,Ua: NEGATIVE
Nitrite: NEGATIVE
Protein, ur: NEGATIVE mg/dL
Specific Gravity, Urine: 1.021 (ref 1.005–1.030)
pH: 5 (ref 5.0–8.0)

## 2024-03-31 LAB — CHLAMYDIA/NGC RT PCR (ARMC ONLY)
Chlamydia Tr: NOT DETECTED
N gonorrhoeae: NOT DETECTED

## 2024-03-31 MED ORDER — IBUPROFEN 600 MG PO TABS
600.0000 mg | ORAL_TABLET | Freq: Once | ORAL | Status: AC
  Administered 2024-03-31: 600 mg via ORAL
  Filled 2024-03-31: qty 1

## 2024-03-31 NOTE — ED Triage Notes (Signed)
 Pt comes with redness under foreskin, itching in groin area and pain to touch his penis. Pt states he might have STD. Pt states this started three days ago. Pt states pain after urination. Pt deneis any discharge.

## 2024-03-31 NOTE — ED Provider Notes (Signed)
 [facility] Provider Note    Event Date/Time   First MD Initiated Contact with Patient 03/31/24 [phone]     (approximate)   History     HPI  Ronald Montgomery is a 43 y.o. male history of diabetes, testicular/scrotal pain, thyroid problems, scabies, chlamydia, EtOH abuse presents emergency department with redness under the foreskin, itching in the groin area and pain to his penis on each side that radiates into his testicles.  Started about 3 days ago.  Had unprotected sex about 8 days ago.  Denies any penile discharge.  States no sores or redness.      Physical Exam   Triage Vital Signs: ED Triage Vitals [03/31/24 0744]  Encounter Vitals Group     BP (!) 157/108     Girls Systolic BP Percentile      Girls Diastolic BP Percentile      Boys Systolic BP Percentile      Boys Diastolic BP Percentile      Pulse Rate 85     Resp 18     Temp 98.1 F (36.7 C)     Temp src      SpO2 95 %     Weight      Height      Head Circumference      Peak Flow      Pain Score 4     Pain Loc      Pain Education      Exclude from Growth Chart     Most recent vital signs: Vitals:   03/31/24 0744  BP: (!) 157/108  Pulse: 85  Resp: 18  Temp: 98.1 F (36.7 C)  SpO2: 95%     General: Awake, no distress.   CV:  Good peripheral perfusion. Resp:  Normal effort.  Abd:  No distention.   Other:  Abdomen nontender, pubis minimally tender, no rash noted on the penis, no drainage, no vesicles, testicles slightly tender to palpation, no rash noted at the scrotum   ED Results / Procedures / Treatments   Labs (all labs ordered are listed, but only abnormal results are displayed) Labs Reviewed  URINALYSIS, ROUTINE W REFLEX MICROSCOPIC - Abnormal; Notable for the following components:      Result Value   Color, Urine YELLOW (*)    APPearance HAZY (*)    Glucose, UA 150 (*)    All other components within normal limits  CHLAMYDIA/NGC RT PCR ([facility] ONLY)                EKG     RADIOLOGY Ultrasound scrotum    PROCEDURES:   Procedures  Critical Care:  no     MEDICATIONS ORDERED IN ED: Medications  ibuprofen  (ADVIL ) tablet 600 mg (600 mg Oral Given 03/31/24 0959)     IMPRESSION / MDM / ASSESSMENT AND PLAN / ED COURSE  I reviewed the triage vital signs and the nursing notes.                              Differential diagnosis includes, but is not limited to, testicular pain, penile pain, STI, kidney stone, UTI, candidiasis, cellulitis, Fournier's gangrene  Patient's presentation is most consistent with acute illness / injury with system symptoms.   Labs and imaging ordered  Fournier's gangrene less likely as patient has no cellulitic areas, necrotic areas etc. UTI less likely as UA appears to be normal No candidiasis  noted on exam  Will do ultrasound due to the testicular pain, STD testing  Labs reassuring, ultrasound independently reviewed interpreted by me as being negative for any acute abnormality, does show epididymal cyst which may actually be the cause of his discomfort.  Gave him ibuprofen  here in the ED for pain.  Reassurance that he does not have a STD.  Follow-up with his regular doctor.  Follow-up with urology concerning cyst.  Return if worsening.  Discharged stable condition     FINAL CLINICAL IMPRESSION(S) / ED DIAGNOSES   Final diagnoses:  Epididymal cyst     Rx / DC Orders   ED Discharge Orders     None        Note:  This document was prepared using Dragon voice recognition software and may include unintentional dictation errors.    Gasper Devere ORN, PA-C 03/31/24 1051    Suzanne Kirsch, MD 04/01/24 1242

## 2024-03-31 NOTE — Discharge Instructions (Signed)
 Follow-up with urology.  Please call for appointment.  Return if worsening.  Take ibuprofen  for pain

## 2024-03-31 NOTE — ED Notes (Signed)
 See triage note  Presents with some itching around penis  Possible discharge   He also noticed some redness under foreskin   Afebrile on arrival

## 2024-04-01 ENCOUNTER — Encounter: Payer: Self-pay | Admitting: Radiology

## 2024-04-10 ENCOUNTER — Ambulatory Visit: Payer: MEDICAID

## 2024-04-10 NOTE — Progress Notes (Deleted)
 New patient visit   Patient: Ronald Montgomery   DOB: 1981/01/14   43 y.o. Male  MRN: [id number] Visit Date: 04/10/2024  Today's healthcare provider: Isaiah DELENA Pepper, MD   No chief complaint on file.  Subjective    Ronald Montgomery is a 43 y.o. male who presents today as a new patient to establish care.   Discussed the use of AI scribe software for clinical note transcription with the patient, who gave verbal consent to proceed.  History of Present Illness      Past Medical History:  Diagnosis Date   Alcohol abuse    Asthma    Chlamydia    Constipation    Diabetes mellitus without complication (HCC)    Difficult intubation    Penile pain 02/03/2015   Normal exams x several    Scabies    Testicular/scrotal pain 02/03/2015   Normal exam and US  x several.     Thyroid disorder    during childhood   Past Surgical History:  Procedure Laterality Date   ABCESS DRAINAGE     groin area   CYST EXCISION     top of head   DIRECT LARYNGOSCOPY  07/07/2015   Procedure: DIRECT LARYNGOSCOPY;  Surgeon: Chinita Hasten, MD;  Location: [facility];  Service: ENT;;   INCISION AND DRAINAGE ABSCESS N/A 07/07/2015   Procedure: INCISION AND DRAINAGE ABSCESS;  Surgeon: Chinita Hasten, MD;  Location: [facility];  Service: ENT;  Laterality: N/A;  epiglottic abscess   RHYTIDECTOMY NECK / CHEEK / CHIN     due to accident   TONSILLECTOMY Bilateral 06/13/2017   Procedure: TONSILLECTOMY;  Surgeon: Hasten Chinita, MD;  Location: [facility];  Service: ENT;  Laterality: Bilateral;   Family Status  Relation Name Status   Mother  Alive   Father  Alive   MGF  (Not Specified)  No partnership data on file   Family History  Problem Relation Age of Onset   Skin cancer Mother    Diabetes Mother    Thyroid cancer Maternal Grandfather    Heart attack Maternal Grandfather    Diabetes Maternal Grandfather    Social History   Socioeconomic History   Marital status: Married    Spouse name: Not on  file   Number of children: Not on file   Years of education: Not on file   Highest education level: Not on file  Occupational History   Not on file  Tobacco Use   Smoking status: Every Day    Current packs/day: 0.50    Types: Cigarettes   Smokeless tobacco: Never  Vaping Use   Vaping status: Never Used  Substance and Sexual Activity   Alcohol use: Not Currently    Comment: occassionally   Drug use: Not Currently    Types: Marijuana    Comment: per patient last use was in early 20s.   Sexual activity: Yes    Partners: Female    Birth control/protection: None  Other Topics Concern   Not on file  Social History Narrative   Not on file   Social Drivers of Health   Financial Resource Strain: Not on file  Food Insecurity: Not on file  Transportation Needs: Not on file  Physical Activity: Not on file  Stress: Not on file  Social Connections: Not on file   Outpatient Medications Prior to Visit  Medication Sig   blood glucose meter kit and supplies Dispense based on patient and insurance preference. Use up to  four times daily as directed. (FOR ICD-10 E10.9, E11.9).   cefUROXime  (CEFTIN ) 250 MG tablet Take 1 tablet (250 mg total) by mouth 2 (two) times daily with a meal.   celecoxib  (CELEBREX ) 100 MG capsule Take 1 capsule (100 mg total) by mouth 2 (two) times daily.   cyclobenzaprine  (FLEXERIL ) 10 MG tablet Take 1 tablet (10 mg total) by mouth at bedtime.   gabapentin  (NEURONTIN ) 300 MG capsule Take 1 capsule (300 mg total) by mouth 3 (three) times daily.   glipiZIDE  (GLUCOTROL ) 10 MG tablet Take 1 tablet (10 mg total) by mouth 2 (two) times daily before a meal.   ibuprofen  (ADVIL ) 800 MG tablet Take 1 tablet (800 mg total) by mouth every 8 (eight) hours as needed.   LORazepam  (ATIVAN ) 1 MG tablet Take 1 tablet (1 mg total) by mouth daily as needed for anxiety.   losartan -hydrochlorothiazide (HYZAAR) 100-25 MG tablet Take 1 tablet by mouth daily.   metFORMIN  (GLUCOPHAGE ) 500 MG  tablet Take 2 tablets (1,000 mg total) by mouth 2 (two) times daily with a meal.   naltrexone  (DEPADE) 50 MG tablet Take 1-2 tablets daily PO to assist with alcohol use disorder   naproxen  (NAPROSYN ) 500 MG tablet Take 1 tablet (500 mg total) by mouth 2 (two) times daily with a meal.   nystatin -triamcinolone  ointment (MYCOLOG) Apply 1 Application topically at bedtime. Apply to outside of rectum nightly to assist   ondansetron  (ZOFRAN -ODT) 4 MG disintegrating tablet Take 1 tablet (4 mg total) by mouth every 6 (six) hours as needed for nausea or vomiting.   sertraline  (ZOLOFT ) 100 MG tablet Take 1 tablet (100 mg total) by mouth daily.   sitaGLIPtin  (JANUVIA ) 100 MG tablet Take 1 tablet (100 mg total) by mouth daily.   No facility-administered medications prior to visit.   No Known Allergies  Reviews of Systems as noted in HPI.  {Insert previous labs (optional):23779} {See past labs  Heme  Chem  Endocrine  Serology  Results Review (optional):1}   Objective    There were no vitals taken for this visit. {Insert last BP/Wt (optional):23777}{See vitals history (optional):1}   Physical Exam Constitutional:      Appearance: Normal appearance.  HENT:     Head: Normocephalic and atraumatic.     Mouth/Throat:     Mouth: Mucous membranes are moist.  Eyes:     Pupils: Pupils are equal, round, and reactive to light.  Pulmonary:     Effort: Pulmonary effort is normal.  Skin:    General: Skin is warm.  Neurological:     General: No focal deficit present.     Mental Status: He is alert.     Depression Screen    03/23/2022    1:59 PM 02/25/2022    2:07 PM 02/16/2022    2:13 PM 11/19/2021    1:59 PM  PHQ 2/9 Scores  PHQ - 2 Score 6 2 4 4   PHQ- 9 Score 17  4  9  14       Data saved with a previous flowsheet row definition   No results found for any visits on 04/10/24.  Assessment & Plan      Problem List Items Addressed This Visit   None   Assessment and Plan Assessment &  Plan      No follow-ups on file.      Isaiah DELENA Pepper, MD  [facility] [phone] (phone) [phone] (fax)

## 2024-05-03 ENCOUNTER — Encounter: Payer: Self-pay | Admitting: Emergency Medicine

## 2024-05-03 ENCOUNTER — Other Ambulatory Visit: Payer: Self-pay

## 2024-05-03 ENCOUNTER — Emergency Department
Admission: EM | Admit: 2024-05-03 | Discharge: 2024-05-03 | Disposition: A | Discharge status: Home / Self Care | Payer: MEDICAID | Attending: Emergency Medicine | Admitting: Emergency Medicine

## 2024-05-03 DIAGNOSIS — K0889 Other specified disorders of teeth and supporting structures: Secondary | ICD-10-CM | POA: Insufficient documentation

## 2024-05-03 DIAGNOSIS — I10 Essential (primary) hypertension: Secondary | ICD-10-CM | POA: Insufficient documentation

## 2024-05-03 MED ORDER — CHLORHEXIDINE GLUCONATE 0.12 % MT SOLN: 15.0000 mL | Freq: Two times a day (BID) | OROMUCOSAL | 0 refills | Status: AC

## 2024-05-03 MED ORDER — AMOXICILLIN-POT CLAVULANATE 875-125 MG PO TABS: 1.0000 | ORAL_TABLET | Freq: Two times a day (BID) | ORAL | 0 refills | Status: AC

## 2024-05-03 NOTE — Discharge Instructions (Signed)
 Please take the medication as prescribed.  Please follow-up with a dentist.  Please return for any new, worsening, or changing symptoms or other concerns.  It was a pleasure caring for you today.

## 2024-05-03 NOTE — ED Triage Notes (Signed)
 C/O right lower dental pain and right ear pain x 3 days.

## 2024-05-03 NOTE — ED Provider Notes (Signed)
 [facility] Provider Note    Event Date/Time   First MD Initiated Contact with Patient 05/03/24 1046     (approximate)   History   Ear Pain   HPI  Ronald Montgomery is a 43 y.o. male who presents today for evaluation of right sided dental pain and radiation of pain to his ear for the past couple of days.  Reports that the pain radiates to his right ear.  He denies any change in his hearing.  No difficulty breathing or swallowing.  Patient Active Problem List   Diagnosis Date Noted   High risk heterosexual behavior 03/23/2022   Primary hypertension 03/23/2022   Depression, recurrent 03/23/2022   Sciatica associated with disorder of lumbosacral spine 02/25/2022   Rectal pain 02/25/2022   Chronic alcohol dependence, continuous (HCC) 11/19/2021          Physical Exam   Triage Vital Signs: ED Triage Vitals  Encounter Vitals Group     BP 05/03/24 1040 (!) 146/106     Girls Systolic BP Percentile --      Girls Diastolic BP Percentile --      Boys Systolic BP Percentile --      Boys Diastolic BP Percentile --      Pulse Rate 05/03/24 1040 93     Resp 05/03/24 1040 16     Temp 05/03/24 1040 98 F (36.7 C)     Temp src --      SpO2 05/03/24 1040 98 %     Weight 05/03/24 1041 221 lb 12.5 oz (100.6 kg)     Height 05/03/24 1047 5' 6 (1.676 m)     Head Circumference --      Peak Flow --      Pain Score 05/03/24 1040 8     Pain Loc --      Pain Education --      Exclude from Growth Chart --     Most recent vital signs: Vitals:   05/03/24 1040  BP: (!) 146/106  Pulse: 93  Resp: 16  Temp: 98 F (36.7 C)  SpO2: 98%    Physical Exam Vitals and nursing note reviewed.  Constitutional:      General: Awake and alert. No acute distress.    Appearance: Normal appearance. The patient is normal weight.  HENT:     Head: Normocephalic and atraumatic.     Mouth: Mucous membranes are moist.  Right TM normal-appearing, some cerumen within the  canal, with a normal-appearing canal otherwise Dental caries noted, no gingival fluctuance.  No sublingual swelling or erythema.  No voice change, no trismus, no drooling.  No facial or neck swelling or erythema. Eyes:     General: PERRL. Normal EOMs        Right eye: No discharge.        Left eye: No discharge.     Conjunctiva/sclera: Conjunctivae normal.  Cardiovascular:     Rate and Rhythm: Normal rate  Pulmonary:     Effort: Pulmonary effort is normal. No respiratory distress.  Abdominal:     Abdomen is soft.  Musculoskeletal:        General: No swelling. Normal range of motion.     Cervical back: Normal range of motion and neck supple.  Skin:    General: Skin is warm and dry.     Capillary Refill: Capillary refill takes less than 2 seconds.     Findings: No rash.  Neurological:  Mental Status: The patient is awake and alert.      ED Results / Procedures / Treatments   Labs (all labs ordered are listed, but only abnormal results are displayed) Labs Reviewed - No data to display   EKG     RADIOLOGY     PROCEDURES:  Critical Care performed:   Procedures   MEDICATIONS ORDERED IN ED: Medications - No data to display   IMPRESSION / MDM / ASSESSMENT AND PLAN / ED COURSE  I reviewed the triage vital signs and the nursing notes.   Differential diagnosis includes, but is not limited to, pulpitis, dental caries, dental decay, abscess.   I reviewed the patient's chart.  Patient was seen in February 2023 with the same complaint.   Patient is awake and alert, afebrile and nontoxic in appearance.   Patient has poor dentition and obvious dental decay, I suspect pain is from dental caries vs pulpitits. No gingival swelling or fluctuance concerning for gingival abscess.  No trismus, nuchal rigidity, neck pain, hot potato voice, uvular deviation or malocclusion to suggest deep space infection. No sublingual swelling concerning for Ludwig's angina.  Patient was  started on antibiotics and chlorhexidine  mouth rinse. Discussed care plan, return precautions, and advised close outpatient follow-up with dentist. Patient agrees with plan of care.   Patient's presentation is most consistent with acute complicated illness / injury requiring diagnostic workup.      FINAL CLINICAL IMPRESSION(S) / ED DIAGNOSES   Final diagnoses:  Pain, dental     Rx / DC Orders   ED Discharge Orders          Ordered    amoxicillin -clavulanate (AUGMENTIN ) 875-125 MG tablet  2 times daily        05/03/24 1104    chlorhexidine  (PERIDEX ) 0.12 % solution  2 times daily        05/03/24 1104             Note:  This document was prepared using Dragon voice recognition software and may include unintentional dictation errors.   Isahi Godwin E, PA-C 05/03/24 1403    Dorothyann Drivers, MD 05/03/24 [phone]

## 2024-06-07 ENCOUNTER — Other Ambulatory Visit: Payer: Self-pay

## 2024-06-07 ENCOUNTER — Emergency Department
Admission: EM | Admit: 2024-06-07 | Discharge: 2024-06-07 | Disposition: A | Discharge status: Home / Self Care | Payer: Self-pay | Attending: Emergency Medicine | Admitting: Emergency Medicine

## 2024-06-07 DIAGNOSIS — J45909 Unspecified asthma, uncomplicated: Secondary | ICD-10-CM | POA: Insufficient documentation

## 2024-06-07 DIAGNOSIS — J019 Acute sinusitis, unspecified: Secondary | ICD-10-CM | POA: Insufficient documentation

## 2024-06-07 DIAGNOSIS — E119 Type 2 diabetes mellitus without complications: Secondary | ICD-10-CM | POA: Insufficient documentation

## 2024-06-07 DIAGNOSIS — H6502 Acute serous otitis media, left ear: Secondary | ICD-10-CM | POA: Insufficient documentation

## 2024-06-07 MED ORDER — IBUPROFEN 600 MG PO TABS
600.0000 mg | ORAL_TABLET | Freq: Once | ORAL | Status: AC
  Administered 2024-06-07: 600 mg via ORAL
  Filled 2024-06-07: qty 1

## 2024-06-07 MED ORDER — AMOXICILLIN 500 MG PO TABS: 500.0000 mg | ORAL_TABLET | Freq: Three times a day (TID) | ORAL | 0 refills | Status: AC

## 2024-06-07 MED ORDER — PSEUDOEPHEDRINE HCL 30 MG PO TABS: 30.0000 mg | ORAL_TABLET | Freq: Four times a day (QID) | ORAL | 0 refills | Status: AC | PRN

## 2024-06-07 NOTE — ED Provider Notes (Signed)
" ° °  [facility] Provider Note    Event Date/Time   First MD Initiated Contact with Patient 06/07/24 0820     (approximate)   History   Otalgia   HPI  Ronald Montgomery is a 44 y.o. male with history of asthma, diabetes and as listed in EMR presents to the emergency department for treatment and evaluation of left ear pain for the past week.  He reports that he has had some sinus congestion and drainage and has heard a crackling popping sensation in his left ear.  He reports subjective fever off and on over the week.  He also reports postnasal drip and tender area below the ear at the angle of the jaw.  No alleviating measures attempted prior to arrival..   Physical Exam    Vitals:   06/07/24 0811  BP: (!) 166/102  Pulse: 94  Resp: 18  Temp: 98.3 F (36.8 C)  SpO2: 98%    General: Awake, no distress.  CV:  Good peripheral perfusion.  Resp:  Normal effort.  Abd:  No distention.  Other:  Left TM injected with serous fluid.  Right TM is normal.  Tender area just at the angle of the jaw below the ear.    ED Results / Procedures / Treatments   Labs (all labs ordered are listed, but only abnormal results are displayed)  Labs Reviewed - No data to display   EKG  Not indicated   RADIOLOGY  Image and radiology report reviewed and interpreted by me. Radiology report consistent with the same.  Not indicated  PROCEDURES:  Critical Care performed: No  Procedures   MEDICATIONS ORDERED IN ED:  Medications  ibuprofen  (ADVIL ) tablet 600 mg (600 mg Oral Given 06/07/24 0915)     IMPRESSION / MDM / ASSESSMENT AND PLAN / ED COURSE   I have reviewed the triage note and vital signs. Vital signs --hypertensive   Differential diagnosis includes, but is not limited to, sinusitis, otitis media, cerumen impaction  Patient's presentation is most consistent with acute illness / injury with system symptoms.  44 year old male presenting to the  emergency department for treatment and evaluation of left ear pain pressure and congestion and postnasal drip for the past week.  See HPI for further details.  On exam, serous fluid is noted behind the TM on the left side.  Sinus congestion is noted.  Has some tenderness just under the left jaw area.  This is likely from postnasal drip. Plan will be to treat with amoxicillin  and sudafed.  Outpatient follow-up instructions given and ER return precautions advised.      FINAL CLINICAL IMPRESSION(S) / ED DIAGNOSES   Final diagnoses:  Non-recurrent acute serous otitis media of left ear  Acute non-recurrent sinusitis, unspecified location     Rx / DC Orders   ED Discharge Orders          Ordered    amoxicillin  (AMOXIL ) 500 MG tablet  3 times daily        06/07/24 0841    pseudoephedrine  (SUDAFED) 30 MG tablet  Every 6 hours PRN        06/07/24 0841             Note:  This document was prepared using Dragon voice recognition software and may include unintentional dictation errors.   Herlinda Kirk NOVAK, FNP 06/07/24 1040  "

## 2024-06-07 NOTE — ED Triage Notes (Signed)
 Pt presents to ED from home C/O L ear pain X 1 week. Reports fever about a week ago, resolved now.
